# Patient Record
Sex: Female | Born: 1970 | Race: Black or African American | Hispanic: No | Smoking: Never smoker
Health system: Southern US, Community
[De-identification: ages and names within clinical notes are randomized; demographics above are authoritative.]

## PROBLEM LIST (undated history)

## (undated) DIAGNOSIS — I10 Essential (primary) hypertension: Secondary | ICD-10-CM

## (undated) DIAGNOSIS — M549 Dorsalgia, unspecified: Secondary | ICD-10-CM

## (undated) DIAGNOSIS — R809 Proteinuria, unspecified: Secondary | ICD-10-CM

## (undated) DIAGNOSIS — Z90710 Acquired absence of both cervix and uterus: Secondary | ICD-10-CM

## (undated) DIAGNOSIS — M329 Systemic lupus erythematosus, unspecified: Secondary | ICD-10-CM

## (undated) DIAGNOSIS — R42 Dizziness and giddiness: Secondary | ICD-10-CM

## (undated) DIAGNOSIS — R110 Nausea: Secondary | ICD-10-CM

## (undated) DIAGNOSIS — D649 Anemia, unspecified: Secondary | ICD-10-CM

## (undated) DIAGNOSIS — K5792 Diverticulitis of intestine, part unspecified, without perforation or abscess without bleeding: Secondary | ICD-10-CM

## (undated) HISTORY — DX: Anemia, unspecified: D64.9

## (undated) HISTORY — PX: TOE SURGERY: SHX1073

## (undated) HISTORY — PX: BREAST REDUCTION SURGERY: SHX8

## (undated) HISTORY — DX: Hypocalcemia: E83.51

## (undated) HISTORY — DX: Dizziness and giddiness: R42

## (undated) HISTORY — DX: Nausea: R11.0

## (undated) HISTORY — DX: Dorsalgia, unspecified: M54.9

## (undated) HISTORY — DX: Diverticulitis of intestine, part unspecified, without perforation or abscess without bleeding: K57.92

## (undated) HISTORY — PX: TUBAL LIGATION: SHX77

## (undated) HISTORY — DX: Acquired absence of both cervix and uterus: Z90.710

## (undated) HISTORY — PX: CHOLECYSTECTOMY: SHX55

## (undated) HISTORY — PX: ABDOMINAL HYSTERECTOMY: SHX81

---
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
## 1990-08-08 HISTORY — PX: TUBAL LIGATION: SHX77

## 1995-08-09 DIAGNOSIS — I10 Essential (primary) hypertension: Secondary | ICD-10-CM | POA: Insufficient documentation

## 1995-08-09 HISTORY — PX: CHOLECYSTECTOMY: SHX55

## 1995-08-09 HISTORY — DX: Essential (primary) hypertension: I10

## 1997-08-08 HISTORY — PX: VAGINAL HYSTERECTOMY: SUR661

## 2005-08-08 HISTORY — PX: BREAST REDUCTION SURGERY: SHX8

## 2005-08-08 HISTORY — PX: REDUCTION MAMMAPLASTY: SUR839

## 2010-08-08 DIAGNOSIS — IMO0002 Reserved for concepts with insufficient information to code with codable children: Secondary | ICD-10-CM

## 2010-08-08 DIAGNOSIS — M329 Systemic lupus erythematosus, unspecified: Secondary | ICD-10-CM

## 2010-08-08 HISTORY — DX: Systemic lupus erythematosus, unspecified: M32.9

## 2010-08-08 HISTORY — DX: Reserved for concepts with insufficient information to code with codable children: IMO0002

## 2017-12-01 ENCOUNTER — Emergency Department (HOSPITAL_COMMUNITY): Admission: EM | Admit: 2017-12-01 | Discharge: 2017-12-01 | Discharge status: Against Medical Advice | Payer: Self-pay

## 2018-04-18 ENCOUNTER — Encounter (HOSPITAL_COMMUNITY): Payer: Self-pay | Admitting: Family Medicine

## 2018-04-18 ENCOUNTER — Emergency Department (HOSPITAL_COMMUNITY)
Admission: EM | Admit: 2018-04-18 | Discharge: 2018-04-18 | Disposition: A | Discharge status: Home / Self Care | Payer: BLUE CROSS/BLUE SHIELD | Attending: Emergency Medicine | Admitting: Emergency Medicine

## 2018-04-18 DIAGNOSIS — K0889 Other specified disorders of teeth and supporting structures: Secondary | ICD-10-CM | POA: Insufficient documentation

## 2018-04-18 DIAGNOSIS — I10 Essential (primary) hypertension: Secondary | ICD-10-CM | POA: Insufficient documentation

## 2018-04-18 HISTORY — DX: Essential (primary) hypertension: I10

## 2018-04-18 HISTORY — DX: Systemic lupus erythematosus, unspecified: M32.9

## 2018-04-18 MED ORDER — HYDROCODONE-ACETAMINOPHEN 5-325 MG PO TABS
1.0000 | ORAL_TABLET | Freq: Once | ORAL | Status: AC
  Administered 2018-04-18: 1 via ORAL
  Filled 2018-04-18: qty 1

## 2018-04-18 MED ORDER — HYDROCODONE-ACETAMINOPHEN 5-325 MG PO TABS: 1.0000 | ORAL_TABLET | ORAL | 0 refills | Status: AC | PRN

## 2018-04-18 NOTE — ED Triage Notes (Signed)
Patient reports she had a dental filling from the left upper come out. She has a dental appointment tomorrow 2:15 but the pain is intense.

## 2018-04-18 NOTE — ED Provider Notes (Signed)
[facility]-EMERGENCY DEPT Provider Note   CSN: [id number] Arrival date & time: 04/18/18  1801     History   Chief Complaint Chief Complaint  Patient presents with  . Dental Pain    HPI Crystal Winters is a 47 y.o. female.  47 y.o female with a PMH of HTN presents to the ED with a chief complaint of dental pain. Patient reports a filling fell off her left tooth yesterday, she reports sharp pain to her tooth. She was seen by a dentist yesterday and referred to [facility] for further treatment. Patient has tried ibuprofen, tylenol, aleve and warm compresses to the area but states no relieve in symptoms. Patient appointment with [facility] tomorrow at 2:15pm but states the pain is too severe.  She denies any fever, difficulty swallowing, other symptoms.     Past Medical History:  Diagnosis Date  . Hypertension   . Lupus (HCC)     There are no active problems to display for this patient.   Past Surgical History:  Procedure Laterality Date  . ABDOMINAL HYSTERECTOMY    . BREAST REDUCTION SURGERY    . CHOLECYSTECTOMY    . TOE SURGERY Right   . TUBAL LIGATION       OB History   None      Home Medications    Prior to Admission medications   Medication Sig Start Date End Date Taking? Authorizing Provider  HYDROcodone-acetaminophen (NORCO/VICODIN) 5-325 MG tablet Take 1 tablet by mouth every 4 (four) hours as needed for up to 3 days. 04/18/18 04/21/18  Claude Manges, PA-C    Family History History reviewed. No pertinent family history.  Social History Social History   Tobacco Use  . Smoking status: Never Smoker  . Smokeless tobacco: Never Used  Substance Use Topics  . Alcohol use: Not Currently  . Drug use: Never     Allergies   Patient has no allergy information on record.   Review of Systems Review of Systems  Constitutional: Negative for fever.  HENT: Positive for dental problem. Negative for facial swelling.   All other systems reviewed and  are negative.    Physical Exam Updated Vital Signs BP (!) 166/119   Pulse 79   Temp 97.6 F (36.4 C)   Resp 19   Ht 6' (1.829 m)   Wt 86.2 kg   SpO2 95%   BMI 25.77 kg/m   Physical Exam  Constitutional: She is oriented to person, place, and time. She appears well-developed and well-nourished.  Nontoxic, non-ill-appearing.  Patient holding warm rag to her mouth.  HENT:  Head: Normocephalic and atraumatic.  Mouth/Throat: Uvula is midline and oropharynx is clear and moist. Abnormal dentition. Dental caries present. No dental abscesses. No posterior oropharyngeal edema or posterior oropharyngeal erythema.    There is a tooth broken off, no bone is exposed at this time.  There is no abscess or fluctuance formation around the area.  Eyes: Pupils are equal, round, and reactive to light.  Neck: Normal range of motion. Neck supple.  Cardiovascular: Normal heart sounds.  Pulmonary/Chest: Breath sounds normal.  Abdominal: Soft.  Musculoskeletal: She exhibits no tenderness or deformity.  Neurological: She is alert and oriented to person, place, and time.  Skin: Skin is warm and dry.  Nursing note and vitals reviewed.    ED Treatments / Results  Labs (all labs ordered are listed, but only abnormal results are displayed) Labs Reviewed - No data to display  EKG None  Radiology  No results found.  Procedures Procedures (including critical care time)  Medications Ordered in ED Medications  HYDROcodone-acetaminophen (NORCO/VICODIN) 5-325 MG per tablet 1 tablet (has no administration in time range)     Initial Impression / Assessment and Plan / ED Course  I have reviewed the triage vital signs and the nursing notes.  Pertinent labs & imaging results that were available during my care of the patient were reviewed by me and considered in my medical decision making (see chart for details).     Patient presents with dental pain that began a couple days ago.  She has tried  ibuprofen, Aleve, Tylenol states no relieving symptoms.  Patient has an appointment with [facility] tomorrow but in order to get her dental complaint evaluated.  There is no abscess present during examination.  At this time I will provide patient with the Vicodin during the ED and also give her a prescription for some tablets until her appointment tomorrow.  It appears nontoxic, she denies any fever or difficulty swallowing or other complaints.  Patient is hypertensive upon arrival she has a previous history of hypertension and states she took her medication but the pain is very severe however she denies any headache, chest pain, shortness of breath, difficulty urinating.  Patient understands and agrees with plan.  Return precautions provided.  Final Clinical Impressions(s) / ED Diagnoses   Final diagnoses:  Pain, dental    ED Discharge Orders         Ordered    HYDROcodone-acetaminophen (NORCO/VICODIN) 5-325 MG tablet  Every 4 hours PRN     04/18/18 1848           Claude Manges, PA-C 04/18/18 1850    Mancel Bale, MD 04/20/18 0011

## 2018-04-18 NOTE — Discharge Instructions (Addendum)
I have provided pain medication, please take as directed for severe pain.If you experience any fever, difficulty swallowing or worsening symptoms you may return to the ED for reevaluation.

## 2018-08-17 ENCOUNTER — Other Ambulatory Visit: Payer: Self-pay

## 2018-08-17 ENCOUNTER — Emergency Department (HOSPITAL_COMMUNITY)
Admission: EM | Admit: 2018-08-17 | Discharge: 2018-08-17 | Disposition: A | Discharge status: Home / Self Care | Payer: BLUE CROSS/BLUE SHIELD | Attending: Emergency Medicine | Admitting: Emergency Medicine

## 2018-08-17 ENCOUNTER — Encounter (HOSPITAL_COMMUNITY): Payer: Self-pay | Admitting: *Deleted

## 2018-08-17 ENCOUNTER — Emergency Department (HOSPITAL_COMMUNITY): Payer: BLUE CROSS/BLUE SHIELD

## 2018-08-17 DIAGNOSIS — R51 Headache: Secondary | ICD-10-CM

## 2018-08-17 DIAGNOSIS — R519 Headache, unspecified: Secondary | ICD-10-CM

## 2018-08-17 DIAGNOSIS — R079 Chest pain, unspecified: Secondary | ICD-10-CM | POA: Insufficient documentation

## 2018-08-17 DIAGNOSIS — I10 Essential (primary) hypertension: Secondary | ICD-10-CM | POA: Insufficient documentation

## 2018-08-17 LAB — CBC
HCT: 42.3 % (ref 36.0–46.0)
HEMOGLOBIN: 13.8 g/dL (ref 12.0–15.0)
MCH: 28.1 pg (ref 26.0–34.0)
MCHC: 32.6 g/dL (ref 30.0–36.0)
MCV: 86.2 fL (ref 80.0–100.0)
Platelets: 202 10*3/uL (ref 150–400)
RBC: 4.91 MIL/uL (ref 3.87–5.11)
RDW: 11.8 % (ref 11.5–15.5)
WBC: 9.3 10*3/uL (ref 4.0–10.5)
nRBC: 0 % (ref 0.0–0.2)

## 2018-08-17 LAB — BASIC METABOLIC PANEL
Anion gap: 11 (ref 5–15)
BUN: 13 mg/dL (ref 6–20)
CALCIUM: 8.9 mg/dL (ref 8.9–10.3)
CO2: 23 mmol/L (ref 22–32)
Chloride: 100 mmol/L (ref 98–111)
Creatinine, Ser: 0.82 mg/dL (ref 0.44–1.00)
GFR calc Af Amer: >60 mL/min (ref >60)
GLUCOSE: 118 mg/dL — AB (ref 70–99)
POTASSIUM: 3.2 mmol/L — AB (ref 3.5–5.1)
Sodium: 134 mmol/L — ABNORMAL LOW (ref 135–145)

## 2018-08-17 LAB — POCT I-STAT TROPONIN I: TROPONIN I, POC: 0 ng/mL (ref 0.00–0.08)

## 2018-08-17 MED ORDER — ALUM & MAG HYDROXIDE-SIMETH 200-200-20 MG/5ML PO SUSP
30.0000 mL | Freq: Once | ORAL | Status: AC
  Administered 2018-08-17: 30 mL via ORAL
  Filled 2018-08-17: qty 30

## 2018-08-17 MED ORDER — HYDROCODONE-ACETAMINOPHEN 5-325 MG PO TABS
1.0000 | ORAL_TABLET | Freq: Once | ORAL | Status: AC
  Administered 2018-08-17: 1 via ORAL
  Filled 2018-08-17: qty 1

## 2018-08-17 MED ORDER — LIDOCAINE VISCOUS HCL 2 % MT SOLN
15.0000 mL | Freq: Once | OROMUCOSAL | Status: AC
  Administered 2018-08-17: 15 mL via ORAL
  Filled 2018-08-17: qty 15

## 2018-08-17 MED ORDER — ONDANSETRON 8 MG PO TBDP
8.0000 mg | ORAL_TABLET | Freq: Once | ORAL | Status: AC
  Administered 2018-08-17: 8 mg via ORAL
  Filled 2018-08-17: qty 1

## 2018-08-17 NOTE — ED Triage Notes (Signed)
Pt c/o mid-sternal c/p.  Have been seen by a cardiologist in [place].  Pt c/o HTN, headache, & vomiting.

## 2018-08-17 NOTE — ED Provider Notes (Signed)
[facility]-EMERGENCY DEPT Provider Note   CSN: [id number] Arrival date & time: 08/17/18  0037     History   Chief Complaint Chief Complaint  Patient presents with  . Chest Pain    HPI Crystal Winters is a 48 y.o. female.  Patient to Ed with c/o chest pain located in the center of her chest. It is described as steady pressure, radiating to center of back and constant. She denies cough or fever. She denies SOB. She has had mild nausea without vomiting. She has seen a cardiologist in the past for similar symptoms but denies diagnosis of heart disease.  She did not take anything for symptoms. No modifying factors identified.  The history is provided by the patient. No language interpreter was used.    Past Medical History:  Diagnosis Date  . Hypertension   . Lupus (HCC)     There are no active problems to display for this patient.   Past Surgical History:  Procedure Laterality Date  . ABDOMINAL HYSTERECTOMY    . BREAST REDUCTION SURGERY    . CHOLECYSTECTOMY    . TOE SURGERY Right   . TUBAL LIGATION       OB History   No obstetric history on file.      Home Medications    Prior to Admission medications   Not on File    Family History No family history on file.  Social History Social History   Tobacco Use  . Smoking status: Never Smoker  . Smokeless tobacco: Never Used  Substance Use Topics  . Alcohol use: Not Currently  . Drug use: Never     Allergies   Prochlorperazine   Review of Systems Review of Systems  Constitutional: Negative for chills, diaphoresis and fever.  HENT: Negative.   Respiratory: Negative.  Negative for cough and shortness of breath.   Cardiovascular: Positive for chest pain. Negative for palpitations.  Gastrointestinal: Positive for nausea. Negative for vomiting.  Musculoskeletal: Negative.   Skin: Negative.   Neurological: Negative.      Physical Exam Updated Vital Signs BP (!) 154/100 (BP  Location: Right Arm)   Pulse 76   Temp 98.1 F (36.7 C) (Oral)   Resp 14   Ht 6' (1.829 m)   Wt 83.9 kg   SpO2 98%   BMI 25.09 kg/m   Physical Exam Constitutional:      Appearance: She is well-developed.  HENT:     Head: Normocephalic.  Neck:     Musculoskeletal: Normal range of motion and neck supple.  Cardiovascular:     Rate and Rhythm: Normal rate and regular rhythm.     Heart sounds: No murmur.  Pulmonary:     Effort: Pulmonary effort is normal.     Breath sounds: Normal breath sounds. No wheezing, rhonchi or rales.  Chest:     Chest wall: No tenderness.  Abdominal:     General: Bowel sounds are normal.     Palpations: Abdomen is soft.     Tenderness: There is no abdominal tenderness. There is no guarding or rebound.  Musculoskeletal: Normal range of motion.     Right lower leg: No edema.     Left lower leg: No edema.  Skin:    General: Skin is warm and dry.     Findings: No rash.  Neurological:     Mental Status: She is alert and oriented to person, place, and time.      ED Treatments / Results  Labs (all labs ordered are listed, but only abnormal results are displayed) Labs Reviewed  BASIC METABOLIC PANEL - Abnormal; Notable for the following components:      Result Value   Sodium 134 (*)    Potassium 3.2 (*)    Glucose, Bld 118 (*)    All other components within normal limits  CBC  I-STAT TROPONIN, ED  POCT I-STAT TROPONIN I    EKG EKG Interpretation  Date/Time:  Friday August 17 2018 01:02:31 EST Ventricular Rate:  68 PR Interval:    QRS Duration: 93 QT Interval:  405 QTC Calculation: 431 R Axis:   15 Text Interpretation:  Sinus rhythm Probable left atrial enlargement Minimal ST elevation, anterior leads No previous ECGs available Confirmed by Paula Libra ([id number]) on 08/17/2018 1:25:10 AM   Radiology Dg Chest 2 View  Result Date: 08/17/2018 CLINICAL DATA:  Chest pain EXAM: CHEST - 2 VIEW COMPARISON:  None. FINDINGS: The heart size and  mediastinal contours are within normal limits. Both lungs are clear. The visualized skeletal structures are unremarkable. IMPRESSION: No active cardiopulmonary disease. Electronically Signed   By: Jasmine Pang M.D.   On: 08/17/2018 01:50    Procedures Procedures (including critical care time)  Medications Ordered in ED Medications  alum & mag hydroxide-simeth (MAALOX/MYLANTA) 200-200-20 MG/5ML suspension 30 mL (has no administration in time range)    And  lidocaine (XYLOCAINE) 2 % viscous mouth solution 15 mL (has no administration in time range)  HYDROcodone-acetaminophen (NORCO/VICODIN) 5-325 MG per tablet 1 tablet (has no administration in time range)  ondansetron (ZOFRAN-ODT) disintegrating tablet 8 mg (8 mg Oral Given 08/17/18 0513)     Initial Impression / Assessment and Plan / ED Course  I have reviewed the triage vital signs and the nursing notes.  Pertinent labs & imaging results that were available during my care of the patient were reviewed by me and considered in my medical decision making (see chart for details).     Patient to ED with complaint of CP, without SOB or diaphoresis. Pain radiates to back.   The patient is reassured by her lab studies which show a negative troponin, EKG, CXR, CMET, CBC. Given no history of heart disease, distribution of pain and negative work up, GI cocktail ordered.   She reports some improvement with GI cocktail. She does not want to wait for another troponin to be done. She states she is tired and wants to go home. Feel she is safe for discharge. Encouraged to see her doctor for any ongoing symptoms.   Final Clinical Impressions(s) / ED Diagnoses   Final diagnoses:  None   1. Nonspecific chest pain  ED Discharge Orders    None       Elpidio Anis, PA-C 08/20/18 0429    Paula Libra, MD 08/20/18 2247

## 2018-08-17 NOTE — Discharge Instructions (Addendum)
Follow up with your doctor for recheck in the next week. Continue taking your regular medications. Return here with any worsening symptoms or new concerns.

## 2018-09-24 ENCOUNTER — Encounter (HOSPITAL_COMMUNITY): Payer: Self-pay

## 2018-09-24 ENCOUNTER — Inpatient Hospital Stay (HOSPITAL_COMMUNITY)
Admission: EM | Admit: 2018-09-24 | Discharge: 2018-09-28 | DRG: 378 | Disposition: A | Discharge status: Home / Self Care | Payer: Self-pay | Attending: Student | Admitting: Student

## 2018-09-24 ENCOUNTER — Emergency Department (HOSPITAL_COMMUNITY): Payer: Self-pay

## 2018-09-24 DIAGNOSIS — M2550 Pain in unspecified joint: Secondary | ICD-10-CM | POA: Diagnosis present

## 2018-09-24 DIAGNOSIS — M329 Systemic lupus erythematosus, unspecified: Secondary | ICD-10-CM | POA: Diagnosis present

## 2018-09-24 DIAGNOSIS — Z6826 Body mass index (BMI) 26.0-26.9, adult: Secondary | ICD-10-CM

## 2018-09-24 DIAGNOSIS — I493 Ventricular premature depolarization: Secondary | ICD-10-CM | POA: Diagnosis present

## 2018-09-24 DIAGNOSIS — Z9049 Acquired absence of other specified parts of digestive tract: Secondary | ICD-10-CM

## 2018-09-24 DIAGNOSIS — K5731 Diverticulosis of large intestine without perforation or abscess with bleeding: Principal | ICD-10-CM | POA: Diagnosis present

## 2018-09-24 DIAGNOSIS — D62 Acute posthemorrhagic anemia: Secondary | ICD-10-CM | POA: Diagnosis present

## 2018-09-24 DIAGNOSIS — E663 Overweight: Secondary | ICD-10-CM | POA: Diagnosis present

## 2018-09-24 DIAGNOSIS — R55 Syncope and collapse: Secondary | ICD-10-CM | POA: Diagnosis present

## 2018-09-24 DIAGNOSIS — I10 Essential (primary) hypertension: Secondary | ICD-10-CM | POA: Diagnosis present

## 2018-09-24 DIAGNOSIS — Z79899 Other long term (current) drug therapy: Secondary | ICD-10-CM

## 2018-09-24 DIAGNOSIS — Z8249 Family history of ischemic heart disease and other diseases of the circulatory system: Secondary | ICD-10-CM

## 2018-09-24 DIAGNOSIS — Z888 Allergy status to other drugs, medicaments and biological substances status: Secondary | ICD-10-CM

## 2018-09-24 DIAGNOSIS — N179 Acute kidney failure, unspecified: Secondary | ICD-10-CM | POA: Diagnosis present

## 2018-09-24 DIAGNOSIS — K922 Gastrointestinal hemorrhage, unspecified: Secondary | ICD-10-CM

## 2018-09-24 DIAGNOSIS — E876 Hypokalemia: Secondary | ICD-10-CM | POA: Diagnosis present

## 2018-09-24 DIAGNOSIS — Z90710 Acquired absence of both cervix and uterus: Secondary | ICD-10-CM

## 2018-09-24 DIAGNOSIS — R42 Dizziness and giddiness: Secondary | ICD-10-CM | POA: Diagnosis present

## 2018-09-24 HISTORY — DX: Diverticulosis of large intestine without perforation or abscess with bleeding: K57.31

## 2018-09-24 HISTORY — DX: Systemic lupus erythematosus, unspecified: M32.9

## 2018-09-24 LAB — COMPREHENSIVE METABOLIC PANEL
ALBUMIN: 4.1 g/dL (ref 3.5–5.0)
ALK PHOS: 85 U/L (ref 38–126)
ALT: 16 U/L (ref 0–44)
AST: 20 U/L (ref 15–41)
Anion gap: 6 (ref 5–15)
BILIRUBIN TOTAL: 0.7 mg/dL (ref 0.3–1.2)
BUN: 17 mg/dL (ref 6–20)
CALCIUM: 8.4 mg/dL — AB (ref 8.9–10.3)
CO2: 26 mmol/L (ref 22–32)
CREATININE: 1.02 mg/dL — AB (ref 0.44–1.00)
Chloride: 108 mmol/L (ref 98–111)
GFR calc Af Amer: >60 mL/min (ref >60)
GLUCOSE: 106 mg/dL — AB (ref 70–99)
POTASSIUM: 3.5 mmol/L (ref 3.5–5.1)
Sodium: 140 mmol/L (ref 135–145)
TOTAL PROTEIN: 7.3 g/dL (ref 6.5–8.1)

## 2018-09-24 LAB — POC OCCULT BLOOD, ED: Fecal Occult Bld: NEGATIVE

## 2018-09-24 LAB — URINALYSIS, ROUTINE W REFLEX MICROSCOPIC
Glucose, UA: NEGATIVE mg/dL
Hgb urine dipstick: NEGATIVE
KETONES UR: 5 mg/dL — AB
Leukocytes,Ua: NEGATIVE
Nitrite: NEGATIVE
PH: 5 (ref 5.0–8.0)
Protein, ur: 30 mg/dL — AB
Specific Gravity, Urine: 1.032 — ABNORMAL HIGH (ref 1.005–1.030)

## 2018-09-24 LAB — PROTIME-INR
INR: 1.06
Prothrombin Time: 13.7 seconds (ref 11.4–15.2)

## 2018-09-24 LAB — CBC
HCT: 38.8 % (ref 36.0–46.0)
HCT: 32.6 % — ABNORMAL LOW (ref 36.0–46.0)
HEMOGLOBIN: 12.4 g/dL (ref 12.0–15.0)
Hemoglobin: 10.3 g/dL — ABNORMAL LOW (ref 12.0–15.0)
MCH: 28.1 pg (ref 26.0–34.0)
MCH: 28.1 pg (ref 26.0–34.0)
MCHC: 32 g/dL (ref 30.0–36.0)
MCHC: 31.6 g/dL (ref 30.0–36.0)
MCV: 87.8 fL (ref 80.0–100.0)
MCV: 88.8 fL (ref 80.0–100.0)
Platelets: 251 10*3/uL (ref 150–400)
Platelets: 200 10*3/uL (ref 150–400)
RBC: 4.42 MIL/uL (ref 3.87–5.11)
RBC: 3.67 MIL/uL — AB (ref 3.87–5.11)
RDW: 11.8 % (ref 11.5–15.5)
RDW: 12 % (ref 11.5–15.5)
WBC: 8.2 10*3/uL (ref 4.0–10.5)
WBC: 9.7 10*3/uL (ref 4.0–10.5)
nRBC: 0 % (ref 0.0–0.2)
nRBC: 0 % (ref 0.0–0.2)

## 2018-09-24 LAB — TROPONIN I

## 2018-09-24 LAB — TYPE AND SCREEN
ABO/RH(D): O POS
Antibody Screen: NEGATIVE

## 2018-09-24 LAB — I-STAT BETA HCG BLOOD, ED (MC, WL, AP ONLY): I-stat hCG, quantitative: <5 m[IU]/mL (ref <5)

## 2018-09-24 LAB — MAGNESIUM: Magnesium: 2.1 mg/dL (ref 1.7–2.4)

## 2018-09-24 LAB — LIPASE, BLOOD: Lipase: 29 U/L (ref 11–51)

## 2018-09-24 LAB — CBG MONITORING, ED: GLUCOSE-CAPILLARY: 122 mg/dL — AB (ref 70–99)

## 2018-09-24 MED ORDER — MELATONIN 3 MG PO TABS
3.0000 mg | ORAL_TABLET | Freq: Every evening | ORAL | Status: DC | PRN
  Administered 2018-09-25 – 2018-09-27 (×2): 3 mg via ORAL
  Filled 2018-09-24 (×4): qty 1

## 2018-09-24 MED ORDER — POTASSIUM CHLORIDE CRYS ER 20 MEQ PO TBCR
20.0000 meq | EXTENDED_RELEASE_TABLET | Freq: Once | ORAL | Status: AC
  Administered 2018-09-24: 20 meq via ORAL
  Filled 2018-09-24: qty 1

## 2018-09-24 MED ORDER — ONDANSETRON HCL 4 MG/2ML IJ SOLN
4.0000 mg | Freq: Four times a day (QID) | INTRAMUSCULAR | Status: DC | PRN
  Administered 2018-09-25 – 2018-09-27 (×3): 4 mg via INTRAVENOUS
  Filled 2018-09-24 (×3): qty 2

## 2018-09-24 MED ORDER — SODIUM CHLORIDE 0.9 % IV BOLUS
1000.0000 mL | Freq: Once | INTRAVENOUS | Status: AC
  Administered 2018-09-24: 1000 mL via INTRAVENOUS

## 2018-09-24 MED ORDER — SODIUM CHLORIDE 0.9% FLUSH: 3.0000 mL | Freq: Once | INTRAVENOUS | Status: DC

## 2018-09-24 MED ORDER — HYDROMORPHONE HCL 1 MG/ML IJ SOLN
1.0000 mg | Freq: Once | INTRAMUSCULAR | Status: AC
  Administered 2018-09-24: 1 mg via INTRAVENOUS
  Filled 2018-09-24: qty 1

## 2018-09-24 MED ORDER — ACETAMINOPHEN 325 MG PO TABS
650.0000 mg | ORAL_TABLET | Freq: Four times a day (QID) | ORAL | Status: DC | PRN
  Administered 2018-09-24 – 2018-09-28 (×3): 650 mg via ORAL
  Filled 2018-09-24 (×3): qty 2

## 2018-09-24 MED ORDER — IOPAMIDOL (ISOVUE-300) INJECTION 61%
INTRAVENOUS | Status: AC
  Filled 2018-09-24: qty 100

## 2018-09-24 MED ORDER — SODIUM CHLORIDE 0.9 % IV SOLN
INTRAVENOUS | Status: AC
  Administered 2018-09-24: 22:00:00 via INTRAVENOUS

## 2018-09-24 MED ORDER — ONDANSETRON HCL 4 MG PO TABS
4.0000 mg | ORAL_TABLET | Freq: Four times a day (QID) | ORAL | Status: DC | PRN
  Administered 2018-09-28: 4 mg via ORAL
  Filled 2018-09-24: qty 1

## 2018-09-24 MED ORDER — ACETAMINOPHEN 650 MG RE SUPP: 650.0000 mg | Freq: Four times a day (QID) | RECTAL | Status: DC | PRN

## 2018-09-24 MED ORDER — SODIUM CHLORIDE (PF) 0.9 % IJ SOLN
INTRAMUSCULAR | Status: AC
  Filled 2018-09-24: qty 50

## 2018-09-24 MED ORDER — ONDANSETRON HCL 4 MG/2ML IJ SOLN
4.0000 mg | Freq: Once | INTRAMUSCULAR | Status: AC
  Administered 2018-09-24: 4 mg via INTRAVENOUS
  Filled 2018-09-24: qty 2

## 2018-09-24 MED ORDER — IOPAMIDOL (ISOVUE-300) INJECTION 61%
100.0000 mL | Freq: Once | INTRAVENOUS | Status: AC | PRN
  Administered 2018-09-24: 100 mL via INTRAVENOUS

## 2018-09-24 NOTE — ED Notes (Signed)
Gave report to [place], Charity fundraiser for room 1226. Patient is currently using the bedside commode. When she is done, will transport patient upstairs.

## 2018-09-24 NOTE — H&P (Signed)
Crystal Winters ACZ:[id number] DOB: 04/20/1971 DOA: 09/24/2018     PCP: System, Pcp Not In   Outpatient Specialists:  NONE  used to see rheumatologist but not now   Patient arrived to ER on 09/24/18 at 1124  Patient coming from: home Lives   With family    Chief Complaint:  Chief Complaint  Patient presents with  . Rectal Bleeding  . Abdominal Pain    HPI: Crystal Winters is a 48 y.o. female with medical history significant of HTN, lupus, vertigo    Presented with   Abdominal pain and rectal bleeding repeated since this AM. NO prior hx of this, have had up to 7 bloody bowel movements today While in the emergency department she had repeated bloody bowel movements with clots. No fevers or chills no associated chest pain no cough or shortness of breath While in emergency waiting room patient had an episode of syncope  Denies NSAID use Yesterday she took dramamine for chronic vertigo   Regarding pertinent Chronic problems:    hx of HTN on Norvasc and Cozaar  Hx of Lupus on Plaquenil does not remember dose have not had steroids for 8 month   While in ER: Initial hemoglobin 12 Repeat hemoglobin obtained and GI was consulted Patient not tachycardic or hypotensive in ER Rectal exam showed red stools which was Hemoccult NEgative but grossly bloody with clots.   The following Work up has been ordered so far:  Orders Placed This Encounter  Procedures  . CT ABDOMEN PELVIS W CONTRAST  . Lipase, blood  . Comprehensive metabolic panel  . CBC  . Urinalysis, Routine w reflex microscopic  . Diet NPO time specified  . Saline Lock IV, Maintain IV access  . Orthostatic vital signs  . Consult to hospitalist  . I-Stat beta hCG blood, ED  . CBG monitoring, ED  . POC occult blood, ED Provider will collect  . EKG 12-Lead  . Type and screen [facility]  . ABO/Rh    Following Medications were ordered in ER: Medications  sodium chloride flush (NS) 0.9 %  injection 3 mL ( Intravenous Canceled Entry 09/24/18 1410)  iopamidol (ISOVUE-300) 61 % injection (has no administration in time range)  sodium chloride (PF) 0.9 % injection (has no administration in time range)  sodium chloride 0.9 % bolus 1,000 mL (0 mLs Intravenous Stopped 09/24/18 1410)  ondansetron (ZOFRAN) injection 4 mg (4 mg Intravenous Given 09/24/18 1253)  sodium chloride 0.9 % bolus 1,000 mL (0 mLs Intravenous Stopped 09/24/18 1443)  HYDROmorphone (DILAUDID) injection 1 mg (1 mg Intravenous Given 09/24/18 1420)  ondansetron (ZOFRAN) injection 4 mg (4 mg Intravenous Given 09/24/18 1420)  iopamidol (ISOVUE-300) 61 % injection 100 mL (100 mLs Intravenous Contrast Given 09/24/18 1550)    Significant initial  Findings: Abnormal Labs Reviewed  COMPREHENSIVE METABOLIC PANEL - Abnormal; Notable for the following components:      Result Value   Glucose, Bld 106 (*)    Creatinine, Ser 1.02 (*)    Calcium 8.4 (*)    All other components within normal limits  URINALYSIS, ROUTINE W REFLEX MICROSCOPIC - Abnormal; Notable for the following components:   Color, Urine AMBER (*)    APPearance HAZY (*)    Specific Gravity, Urine 1.032 (*)    Bilirubin Urine SMALL (*)    Ketones, ur 5 (*)    Protein, ur 30 (*)    Bacteria, UA FEW (*)    All other components within normal  limits  CBG MONITORING, ED - Abnormal; Notable for the following components:   Glucose-Capillary 122 (*)    All other components within normal limits     Lactic Acid, Venous No results found for: LATICACIDVEN  Na 140 K 3.5  Cr  Up from baseline see below Lab Results  Component Value Date   CREATININE 1.02 (H) 09/24/2018   CREATININE 0.82 08/17/2018      WBC 8.2   HG/HCT  Stable,     Component Value Date/Time   HGB 12.4 09/24/2018 1145   HCT 38.8 09/24/2018 1145     Troponin (Point of Care Test) No results for input(s): TROPIPOC in the last 72 hours.     Lipase 29   UA  no evidence of UTI        CTabd/pelvis - Descending and sigmoid diverticulosis   ECG:  Personally reviewed by me showing: HR : 73 Rhythm:  NSR,  no evidence of ischemic changes QTC 460     ED Triage Vitals  Enc Vitals Group     BP 09/24/18 1138 (!) 141/102     Pulse Rate 09/24/18 1138 (!) 120     Resp 09/24/18 1138 17     Temp 09/24/18 1138 98.7 F (37.1 C)     Temp Source 09/24/18 1138 Oral     SpO2 09/24/18 1138 97 %     Weight 09/24/18 1138 195 lb (88.5 kg)     Height 09/24/18 1138 6' (1.829 m)     Head Circumference --      Peak Flow --      Pain Score 09/24/18 1139 7     Pain Loc --      Pain Edu? --      Excl. in GC? --   TMAX(24)@       Latest  Blood pressure (!) 144/95, pulse 64, temperature 98.7 F (37.1 C), temperature source Oral, resp. rate 15, height 6' (1.829 m), weight 88.5 kg, SpO2 100 %.    [facility]   Dr.Buccini They Recommend admit to medicine   Will see in AM   Hospitalist was called for admission for gi bleed   Review of Systems:    Pertinent positives include:blood in stool, syncope  Constitutional:  No weight loss, night sweats, Fevers, chills, fatigue, weight loss  HEENT:  No headaches, Difficulty swallowing,Tooth/dental problems,Sore throat,  No sneezing, itching, ear ache, nasal congestion, post nasal drip,  Cardio-vascular:  No chest pain, Orthopnea, PND, anasarca, dizziness, palpitations.no Bilateral lower extremity swelling  GI:  No heartburn, indigestion, abdominal pain, nausea, vomiting, diarrhea, change in bowel habits, loss of appetite, melena, blood in stool, hematemesis Resp:  no shortness of breath at rest. No dyspnea on exertion, No excess mucus, no productive cough, No non-productive cough, No coughing up of blood.No change in color of mucus.No wheezing. Skin:  no rash or lesions. No jaundice GU:  no dysuria, change in color of urine, no urgency or frequency. No straining to urinate.  No flank pain.  Musculoskeletal:  No  joint pain or no joint swelling. No decreased range of motion. No back pain.  Psych:  No change in mood or affect. No depression or anxiety. No memory loss.  Neuro: no localizing neurological complaints, no tingling, no weakness, no double vision, no gait abnormality, no slurred speech, no confusion  All systems reviewed and apart from HOPI all are negative  Past Medical History:   Past Medical History:  Diagnosis  Date  . Hypertension   . Lupus [facility])       Past Surgical History:  Procedure Laterality Date  . ABDOMINAL HYSTERECTOMY    . BREAST REDUCTION SURGERY    . CHOLECYSTECTOMY    . TOE SURGERY Right   . TUBAL LIGATION      Social History:  Ambulatory   independently     reports that she has never smoked. She has never used smokeless tobacco. She reports previous alcohol use. She reports that she does not use drugs.     Family History:   Family History  Problem Relation Age of Onset  . Rectal cancer Other   . Hypertension Other   . Diabetes Other   . Breast cancer Other   . Colon cancer Other   . Prostate cancer Other   . Stroke Other   . CAD Other   . Coronary artery disease Other     Allergies: Allergies  Allergen Reactions  . Prochlorperazine Other (See Comments)    Muscle stiffness     Prior to Admission medications   Medication Sig Start Date End Date Taking? Authorizing Provider  amLODipine (NORVASC) 10 MG tablet Take 10 mg by mouth daily.   Yes [provider]  citalopram (CELEXA) 10 MG tablet Take 10 mg by mouth daily.   Yes [provider]  dimenhyDRINATE (DRAMAMINE) 50 MG tablet Take 50 mg by mouth daily as needed for nausea or dizziness.   Yes [provider]  losartan (COZAAR) 50 MG tablet Take 50 mg by mouth daily.   Yes [provider]   Physical Exam: Blood pressure (!) 144/95, pulse 64, temperature 98.7 F (37.1 C), temperature source Oral, resp. rate 15, height 6' (1.829 m), weight 88.5 kg, SpO2  100 %. 1. General:  in No  Acute distress   Chronically ill  -appearing 2. Psychological: Alert and   Oriented 3. Head/ENT:    Dry Mucous Membranes                          Head Non traumatic, neck supple                          Poor Dentition 4. SKIN: decreased Skin turgor,  Skin clean Dry and intact no rash 5. Heart: Regular rate and rhythm no Murmur, no Rub or gallop 6. Lungs:  no wheezes or crackles   7. Abdomen: Soft, mildly tender, Non distended  Obese bowel sounds present 8. Lower extremities: no clubbing, cyanosis, no  edema 9. Neurologically Grossly intact, moving all 4 extremities equally   10. MSK: Normal range of motion   LABS:     Recent Labs  Lab 09/24/18 1145  WBC 8.2  HGB 12.4  HCT 38.8  MCV 87.8  PLT 251   Basic Metabolic Panel: Recent Labs  Lab 09/24/18 1145  NA 140  K 3.5  CL 108  CO2 26  GLUCOSE 106*  BUN 17  CREATININE 1.02*  CALCIUM 8.4*      Recent Labs  Lab 09/24/18 1145  AST 20  ALT 16  ALKPHOS 85  BILITOT 0.7  PROT 7.3  ALBUMIN 4.1   Recent Labs  Lab 09/24/18 1145  LIPASE 29   No results for input(s): AMMONIA in the last 168 hours.    HbA1C: No results for input(s): HGBA1C in the last 72 hours. CBG: Recent Labs  Lab 09/24/18 1232  GLUCAP 122*      Urine analysis:    Component Value Date/Time   COLORURINE AMBER (A) 09/24/2018 1154   APPEARANCEUR HAZY (A) 09/24/2018 1154   LABSPEC 1.032 (H) 09/24/2018 1154   PHURINE 5.0 09/24/2018 1154   GLUCOSEU NEGATIVE 09/24/2018 1154   HGBUR NEGATIVE 09/24/2018 1154   BILIRUBINUR SMALL (A) 09/24/2018 1154   KETONESUR 5 (A) 09/24/2018 1154   PROTEINUR 30 (A) 09/24/2018 1154   NITRITE NEGATIVE 09/24/2018 1154   LEUKOCYTESUR NEGATIVE 09/24/2018 1154       Cultures: No results found for: SDES, SPECREQUEST, CULT, REPTSTATUS   Radiological Exams on Admission: Ct Abdomen Pelvis W Contrast  Result Date: 09/24/2018 CLINICAL DATA:  Abdominal distention, pain, rectal  bleeding EXAM: CT ABDOMEN AND PELVIS WITH CONTRAST TECHNIQUE: Multidetector CT imaging of the abdomen and pelvis was performed using the standard protocol following bolus administration of intravenous contrast. CONTRAST:  100mL ISOVUE-300 IOPAMIDOL (ISOVUE-300) INJECTION 61% COMPARISON:  None. FINDINGS: Lower chest: No acute abnormality. Hepatobiliary: No focal liver abnormality is seen. Status post cholecystectomy. No biliary dilatation. Pancreas: Unremarkable. No pancreatic ductal dilatation or surrounding inflammatory changes. Spleen: Normal in size without focal abnormality. Adrenals/Urinary Tract: Adrenal glands are unremarkable. Kidneys are normal, without renal calculi, focal lesion, or hydronephrosis. Bladder is unremarkable. Stomach/Bowel: Stomach is within normal limits. Appendix appears normal. No evidence of bowel wall thickening, distention, or inflammatory changes. Descending and sigmoid diverticulosis. Vascular/Lymphatic: No significant vascular findings are present. No enlarged abdominal or pelvic lymph nodes. Reproductive: No mass or other abnormality. Status post hysterectomy. Other: No abdominal wall hernia or abnormality. No abdominopelvic ascites. Musculoskeletal: No acute or significant osseous findings. IMPRESSION: 1. No definite CT findings to explain abdominal pain and rectal bleeding. 2. Descending and sigmoid diverticulosis without intraluminal contrast or other findings to specifically localize rectal bleeding on this non tailored examination for evaluation of GI bleeding. Electronically Signed   By: Lauralyn PrimesAlex  Bibbey M.D.   On: 09/24/2018 16:10    Chart has been reviewed    Assessment/Plan  48 y.o. female with medical history significant of HTN lupus   Admitted for lower GI bleed likely diverticular  Present on Admission: . Lower GI bleed - - Suspect Lower Gi source No hx of PUD,  melena,  BUN elevation to  suggest otherwise  - Admit  For further management given:  comorbid  illnesses LUPUS  ,  hemodynamic instability Syncope,  gross rectal bleeding, rebleeding     -  most likely Diverticular source   hemodynamic instability present with syncope      - Admit to stepdown given above    -  ER  Provider spoke to gastroenterology ( [facility]  they will see patient in a.m. appreciate their consult   - serial CBC.    - Monitor for any recurrence,  evidence of hemodynamic instability or significant blood loss -  type and screen,  - Transfuse as needed for hemoglobin below 7 or <9 if evidence of significant  bleeding  - Establish at least 2 PIV and fluid resuscitate   - clear liquids for tonight keep nothing by mouth post midnight,  -  monitor for Recurrent significant  Bleeding of red blood and hemodynamic instability in which case Bleeding scan and IR consult would be indicated.   . Essential hypertension on hold for tonight  . Syncope - in the setting of GI bleed, suspect vasovagal, given risk factors of autoimmune disorder we will cycle cardiac enzymes monitor on telemetry . Lupus (HCC) -  hold plaquenil for tonight Hypokalemia will replace   Other plan as per orders.  DVT prophylaxis:  SCD      Code Status:  FULL CODE as per patient   I had personally discussed CODE STATUS with patient and family   Family Communication:   Family  at  Bedside  plan of care was discussed with  Daughter, Wife, Husband, Sister, Brother , father, mother  Disposition Plan:     To home once workup is complete and patient is stable                                    Consults called:  [facility]  Admission status:   Obs    Level of care      SDU tele indefinitely please discontinue once patient no longer qualifies      Caddie Randle 09/24/2018, 7:54 PM    [facility]     after 2 AM please page floor coverage PA If 7AM-7PM, please contact the day team taking care of the patient using [email or web address]

## 2018-09-24 NOTE — ED Provider Notes (Signed)
7:12 PM Repeat cbc pending GI consultation Admit to hospitalist Diverticulosis without diverticulitis    Azalia Bilis, MD 09/24/18 [phone]

## 2018-09-24 NOTE — ED Notes (Signed)
ED TO INPATIENT HANDOFF REPORT  Name/Age/Gender Crystal Winters 48 y.o. female  Code Status   Home/SNF/Other Home  Chief Complaint rectal bleeding  Level of Care/Admitting Diagnosis ED Disposition    ED Disposition Condition Comment   Admit  Hospital Area: [facility] [[id number]]  Level of Care: Stepdown [14]  Admit to SDU based on following criteria: Hemodynamic compromise or significant risk of instability:  Patient requiring short term acute titration and management of vasoactive drips, and invasive monitoring (i.e., CVP and Arterial line).  Diagnosis: Lower GI bleed [426834]  Admitting Physician: Therisa Doyne [[id number]]  Attending Physician: Therisa Doyne [[id number]]  PT Class (Do Not Modify): Observation [104]  PT Acc Code (Do Not Modify): Observation [10022]       Medical History Past Medical History:  Diagnosis Date  . Hypertension   . Lupus (HCC)     Allergies Allergies  Allergen Reactions  . Prochlorperazine Other (See Comments)    Muscle stiffness    IV Location/Drains/Wounds Patient Lines/Drains/Airways Status   Active Line/Drains/Airways    Name:   Placement date:   Placement time:   Site:   Days:   Peripheral IV 09/24/18 Right Antecubital   09/24/18    1230    Antecubital   less than 1   Peripheral IV 09/24/18 Left Antecubital   09/24/18    1231    Antecubital   less than 1          Labs/Imaging Results for orders placed or performed during the hospital encounter of 09/24/18 (from the past 48 hour(s))  Lipase, blood     Status: None   Collection Time: 09/24/18 11:45 AM  Result Value Ref Range   Lipase 29 11 - 51 U/L    Comment: Performed at [facility], [address], [place], [place] [address]  Comprehensive metabolic panel     Status: Abnormal   Collection Time: 09/24/18 11:45 AM  Result Value Ref Range   Sodium 140 135 - 145 mmol/L   Potassium 3.5 3.5 - 5.1 mmol/L   Chloride 108 98 - 111 mmol/L    CO2 26 22 - 32 mmol/L   Glucose, Bld 106 (H) 70 - 99 mg/dL   BUN 17 6 - 20 mg/dL   Creatinine, Ser 2.97 (H) 0.44 - 1.00 mg/dL   Calcium 8.4 (L) 8.9 - 10.3 mg/dL   Total Protein 7.3 6.5 - 8.1 g/dL   Albumin 4.1 3.5 - 5.0 g/dL   AST 20 15 - 41 U/L   ALT 16 0 - 44 U/L   Alkaline Phosphatase 85 38 - 126 U/L   Total Bilirubin 0.7 0.3 - 1.2 mg/dL   GFR calc non Af Amer >60 >60 mL/min   GFR calc Af Amer >60 >60 mL/min   Anion gap 6 5 - 15    Comment: Performed at [facility], [address], [place], [place] [address]  CBC     Status: None   Collection Time: 09/24/18 11:45 AM  Result Value Ref Range   WBC 8.2 4.0 - 10.5 K/uL   RBC 4.42 3.87 - 5.11 MIL/uL   Hemoglobin 12.4 12.0 - 15.0 g/dL   HCT 19.4 17.4 - 08.1 %   MCV 87.8 80.0 - 100.0 fL   MCH 28.1 26.0 - 34.0 pg   MCHC 32.0 30.0 - 36.0 g/dL   RDW 44.8 18.5 - 63.1 %   Platelets 251 150 - 400 K/uL   nRBC 0.0 0.0 -  0.2 %    Comment: Performed at [facility], [address] [place], [place] [address]  I-Stat beta hCG blood, ED     Status: None   Collection Time: 09/24/18 11:53 AM  Result Value Ref Range   I-stat hCG, quantitative <5.0 <5 mIU/mL   Comment 3            Comment:   GEST. AGE      CONC.  (mIU/mL)   <=1 WEEK        5 - 50     2 WEEKS       50 - 500     3 WEEKS       100 - 10,000     4 WEEKS     1,000 - 30,000        FEMALE AND NON-PREGNANT FEMALE:     LESS THAN 5 mIU/mL   Urinalysis, Routine w reflex microscopic     Status: Abnormal   Collection Time: 09/24/18 11:54 AM  Result Value Ref Range   Color, Urine AMBER (A) YELLOW    Comment: BIOCHEMICALS MAY BE AFFECTED BY COLOR   APPearance HAZY (A) CLEAR   Specific Gravity, Urine 1.032 (H) 1.005 - 1.030   pH 5.0 5.0 - 8.0   Glucose, UA NEGATIVE NEGATIVE mg/dL   Hgb urine dipstick NEGATIVE NEGATIVE   Bilirubin Urine SMALL (A) NEGATIVE   Ketones, ur 5 (A) NEGATIVE mg/dL   Protein, ur 30 (A) NEGATIVE mg/dL   Nitrite  NEGATIVE NEGATIVE   Leukocytes,Ua NEGATIVE NEGATIVE   RBC / HPF 0-5 0 - 5 RBC/hpf   WBC, UA 6-10 0 - 5 WBC/hpf   Bacteria, UA FEW (A) NONE SEEN   Squamous Epithelial / LPF 11-20 0 - 5   Mucus PRESENT    Hyaline Casts, UA PRESENT     Comment: Performed at [facility], [address] [place], [place] [address]  CBG monitoring, ED     Status: Abnormal   Collection Time: 09/24/18 12:32 PM  Result Value Ref Range   Glucose-Capillary 122 (H) 70 - 99 mg/dL  Type and screen [facility]     Status: None   Collection Time: 09/24/18 12:33 PM  Result Value Ref Range   ABO/RH(D) O POS    Antibody Screen NEG    Sample Expiration      09/27/2018 Performed at [facility], [address] [place], [place] [address]   POC occult blood, ED Provider will collect     Status: None   Collection Time: 09/24/18  2:09 PM  Result Value Ref Range   Fecal Occult Bld NEGATIVE NEGATIVE   Ct Abdomen Pelvis W Contrast  Result Date: 09/24/2018 CLINICAL DATA:  Abdominal distention, pain, rectal bleeding EXAM: CT ABDOMEN AND PELVIS WITH CONTRAST TECHNIQUE: Multidetector CT imaging of the abdomen and pelvis was performed using the standard protocol following bolus administration of intravenous contrast. CONTRAST:  100mL ISOVUE-300 IOPAMIDOL (ISOVUE-300) INJECTION 61% COMPARISON:  None. FINDINGS: Lower chest: No acute abnormality. Hepatobiliary: No focal liver abnormality is seen. Status post cholecystectomy. No biliary dilatation. Pancreas: Unremarkable. No pancreatic ductal dilatation or surrounding inflammatory changes. Spleen: Normal in size without focal abnormality. Adrenals/Urinary Tract: Adrenal glands are unremarkable. Kidneys are normal, without renal calculi, focal lesion, or hydronephrosis. Bladder is unremarkable. Stomach/Bowel: Stomach is within normal limits. Appendix appears normal. No evidence of bowel wall thickening, distention, or inflammatory  changes. Descending and sigmoid diverticulosis. Vascular/Lymphatic: No  significant vascular findings are present. No enlarged abdominal or pelvic lymph nodes. Reproductive: No mass or other abnormality. Status post hysterectomy. Other: No abdominal wall hernia or abnormality. No abdominopelvic ascites. Musculoskeletal: No acute or significant osseous findings. IMPRESSION: 1. No definite CT findings to explain abdominal pain and rectal bleeding. 2. Descending and sigmoid diverticulosis without intraluminal contrast or other findings to specifically localize rectal bleeding on this non tailored examination for evaluation of GI bleeding. Electronically Signed   By: Lauralyn Primes M.D.   On: 09/24/2018 16:10   None  Pending Labs Unresulted Labs (From admission, onward)    Start     Ordered   09/25/18 1000  CBC  Now then every 6 hours,   R     09/24/18 1955   09/24/18 1946  Magnesium  Add-on,   R     09/24/18 1945   09/24/18 1917  Troponin I - Add-On to previous collection  Add-on,   R     09/24/18 1916   09/24/18 1917  Troponin I - Now Then Q6H  Now then every 6 hours,   R     09/24/18 1916   09/24/18 1917  Protime-INR  Once,   R     09/24/18 1916   09/24/18 1912  CBC  ONCE - STAT,   STAT     09/24/18 1911   09/24/18 1233  ABO/Rh  Once,   R     09/24/18 1233   Unscheduled  Occult blood card to lab, stool RN will collect  As needed,   R    Question:  Specimen to be collected by?  Answer:  RN will collect   09/24/18 1949   Signed and Held  HIV antibody (Routine Testing)  Tomorrow morning,   R     Signed and Held   Signed and Held  Magnesium  Tomorrow morning,   R    Comments:  Call MD if <1.5    Signed and Held   Signed and Held  Phosphorus  Tomorrow morning,   R     Signed and Held   Signed and Held  TSH  Once,   R    Comments:  Cancel if already done within 1 month and notify MD    Signed and Held   Signed and Held  Comprehensive metabolic panel  Once,   R    Comments:  Cal MD for  K<3.5 or >5.0    Signed and Held   Signed and Held  CBC  Once,   R    Comments:  Call for hg <8.0    Signed and Held          Vitals/Pain Today's Vitals   09/24/18 1540 09/24/18 1900 09/24/18 1930 09/24/18 2000  BP: (!) 142/97 (!) 144/95 (!) 144/94 (!) 144/103  Pulse: 66 64 66 69  Resp: (!) 26 15 18 17   Temp:      TempSrc:      SpO2: 99% 100% 100% 100%  Weight:      Height:      PainSc:        Isolation Precautions No active isolations  Medications Medications  sodium chloride flush (NS) 0.9 % injection 3 mL ( Intravenous Canceled Entry 09/24/18 1410)  iopamidol (ISOVUE-300) 61 % injection (has no administration in time range)  sodium chloride (PF) 0.9 % injection (has no administration in time range)  potassium chloride SA (K-DUR,KLOR-CON) CR tablet 20 mEq (has no administration in time  range)  sodium chloride 0.9 % bolus 1,000 mL (0 mLs Intravenous Stopped 09/24/18 1410)  ondansetron (ZOFRAN) injection 4 mg (4 mg Intravenous Given 09/24/18 1253)  sodium chloride 0.9 % bolus 1,000 mL (0 mLs Intravenous Stopped 09/24/18 1443)  HYDROmorphone (DILAUDID) injection 1 mg (1 mg Intravenous Given 09/24/18 1420)  ondansetron (ZOFRAN) injection 4 mg (4 mg Intravenous Given 09/24/18 1420)  iopamidol (ISOVUE-300) 61 % injection 100 mL (100 mLs Intravenous Contrast Given 09/24/18 1550)    Mobility walks

## 2018-09-24 NOTE — ED Notes (Signed)
Chaperoned Dr. Clelia Schaumann of rectal exam.

## 2018-09-24 NOTE — ED Notes (Signed)
Pt had another bloody BM.  Pt states this is her 8th one today.

## 2018-09-24 NOTE — ED Notes (Addendum)
Pt had grossly bloody BM, moderate amount. Pt's 7th bloody BM today.

## 2018-09-24 NOTE — ED Provider Notes (Signed)
[facility]-EMERGENCY DEPT Provider Note   CSN: [id number] Arrival date & time: 09/24/18  1124    History   Chief Complaint Chief Complaint  Patient presents with  . Rectal Bleeding  . Abdominal Pain    HPI Crystal Winters is a 48 y.o. female.     Patient complains of rectal bleeding abdominal cramping.  She has had numerous episodes of rectal bleeding  The history is provided by the patient. No language interpreter was used.  Rectal Bleeding  Quality:  Bright red Amount:  Moderate Timing:  Constant Chronicity:  New Context: not anal fissures   Similar prior episodes: no   Relieved by:  Nothing Worsened by:  Nothing Ineffective treatments:  None tried Associated symptoms: abdominal pain   Risk factors: no anticoagulant use   Abdominal Pain  Associated symptoms: hematochezia   Associated symptoms: no chest pain, no cough, no diarrhea, no fatigue and no hematuria     Past Medical History:  Diagnosis Date  . Hypertension   . Lupus (HCC)     There are no active problems to display for this patient.   Past Surgical History:  Procedure Laterality Date  . ABDOMINAL HYSTERECTOMY    . BREAST REDUCTION SURGERY    . CHOLECYSTECTOMY    . TOE SURGERY Right   . TUBAL LIGATION       OB History   No obstetric history on file.      Home Medications    Prior to Admission medications   Medication Sig Start Date End Date Taking? Authorizing Provider  amLODipine (NORVASC) 10 MG tablet Take 10 mg by mouth daily.   Yes [provider]  citalopram (CELEXA) 10 MG tablet Take 10 mg by mouth daily.   Yes [provider]  dimenhyDRINATE (DRAMAMINE) 50 MG tablet Take 50 mg by mouth daily as needed for nausea or dizziness.   Yes [provider]  losartan (COZAAR) 50 MG tablet Take 50 mg by mouth daily.   Yes [provider]    Family History History reviewed. No pertinent family history.  Social History Social  History   Tobacco Use  . Smoking status: Never Smoker  . Smokeless tobacco: Never Used  Substance Use Topics  . Alcohol use: Not Currently  . Drug use: Never     Allergies   Prochlorperazine   Review of Systems Review of Systems  Constitutional: Negative for appetite change and fatigue.  HENT: Negative for congestion, ear discharge and sinus pressure.   Eyes: Negative for discharge.  Respiratory: Negative for cough.   Cardiovascular: Negative for chest pain.  Gastrointestinal: Positive for abdominal pain and hematochezia. Negative for diarrhea.  Genitourinary: Negative for frequency and hematuria.  Musculoskeletal: Negative for back pain.  Skin: Negative for rash.  Neurological: Negative for seizures and headaches.  Psychiatric/Behavioral: Negative for hallucinations.     Physical Exam Updated Vital Signs BP (!) 133/91   Pulse 67   Temp 98.7 F (37.1 C) (Oral)   Resp 13   Ht 6' (1.829 m)   Wt 88.5 kg   SpO2 100%   BMI 26.45 kg/m   Physical Exam Vitals signs and nursing note reviewed.  Constitutional:      Appearance: She is well-developed.  HENT:     Head: Normocephalic.     Nose: Nose normal.  Eyes:     General: No scleral icterus.    Conjunctiva/sclera: Conjunctivae normal.  Neck:     Musculoskeletal: Neck supple.  Thyroid: No thyromegaly.  Cardiovascular:     Rate and Rhythm: Normal rate and regular rhythm.     Heart sounds: No murmur. No friction rub. No gallop.   Pulmonary:     Breath sounds: No stridor. No wheezing or rales.  Chest:     Chest wall: No tenderness.  Abdominal:     General: There is no distension.     Tenderness: There is no abdominal tenderness. There is no rebound.  Genitourinary:    Comments: Rectal exam shows red stool heme positive Musculoskeletal: Normal range of motion.  Lymphadenopathy:     Cervical: No cervical adenopathy.  Skin:    Findings: No erythema or rash.  Neurological:     Mental Status: She is alert  and oriented to person, place, and time.     Motor: No abnormal muscle tone.     Coordination: Coordination normal.  Psychiatric:        Behavior: Behavior normal.      ED Treatments / Results  Labs (all labs ordered are listed, but only abnormal results are displayed) Labs Reviewed  COMPREHENSIVE METABOLIC PANEL - Abnormal; Notable for the following components:      Result Value   Glucose, Bld 106 (*)    Creatinine, Ser 1.02 (*)    Calcium 8.4 (*)    All other components within normal limits  URINALYSIS, ROUTINE W REFLEX MICROSCOPIC - Abnormal; Notable for the following components:   Color, Urine AMBER (*)    APPearance HAZY (*)    Specific Gravity, Urine 1.032 (*)    Bilirubin Urine SMALL (*)    Ketones, ur 5 (*)    Protein, ur 30 (*)    Bacteria, UA FEW (*)    All other components within normal limits  CBG MONITORING, ED - Abnormal; Notable for the following components:   Glucose-Capillary 122 (*)    All other components within normal limits  LIPASE, BLOOD  CBC  I-STAT BETA HCG BLOOD, ED ([facility], [facility], [facility] ONLY)  POC OCCULT BLOOD, ED  TYPE AND SCREEN  ABO/RH    EKG None  Radiology No results found.  Procedures Procedures (including critical care time)  Medications Ordered in ED Medications  sodium chloride flush (NS) 0.9 % injection 3 mL ( Intravenous Canceled Entry 09/24/18 1410)  iopamidol (ISOVUE-300) 61 % injection (has no administration in time range)  sodium chloride (PF) 0.9 % injection (has no administration in time range)  sodium chloride 0.9 % bolus 1,000 mL (0 mLs Intravenous Stopped 09/24/18 1410)  ondansetron (ZOFRAN) injection 4 mg (4 mg Intravenous Given 09/24/18 1253)  sodium chloride 0.9 % bolus 1,000 mL (0 mLs Intravenous Stopped 09/24/18 1443)  HYDROmorphone (DILAUDID) injection 1 mg (1 mg Intravenous Given 09/24/18 1420)  ondansetron (ZOFRAN) injection 4 mg (4 mg Intravenous Given 09/24/18 1420)  iopamidol (ISOVUE-300) 61 % injection 100 mL (100 mLs  Intravenous Contrast Given 09/24/18 1550)     Initial Impression / Assessment and Plan / ED Course  I have reviewed the triage vital signs and the nursing notes.  Pertinent labs & imaging results that were available during my care of the patient were reviewed by me and considered in my medical decision making (see chart for details). CRITICAL CARE Performed by: Bethann Berkshire Total critical care time: 35 minutes Critical care time was exclusive of separately billable procedures and treating other patients. Critical care was necessary to treat or prevent imminent or life-threatening deterioration. Critical care was time spent personally by me  on the following activities: development of treatment plan with patient and/or surrogate as well as nursing, discussions with consultants, evaluation of patient's response to treatment, examination of patient, obtaining history from patient or surrogate, ordering and performing treatments and interventions, ordering and review of laboratory studies, ordering and review of radiographic studies, pulse oximetry and re-evaluation of patient's condition.        Patient with multiple episodes of rectal bleeding.  CT scan pending Patient is being admitted for lower GI bleed Final Clinical Impressions(s) / ED Diagnoses   Final diagnoses:  None    ED Discharge Orders    None       Bethann Berkshire, MD 09/25/18 1249

## 2018-09-24 NOTE — ED Triage Notes (Signed)
Pt presents with c/o abdominal pain and rectal bleeding. Pt reports she has had multiple episodes of rectal bleeding since this morning.

## 2018-09-25 ENCOUNTER — Other Ambulatory Visit: Payer: Self-pay

## 2018-09-25 LAB — CBC
HCT: 32.3 % — ABNORMAL LOW (ref 36.0–46.0)
HCT: 33.8 % — ABNORMAL LOW (ref 36.0–46.0)
Hemoglobin: 10.2 g/dL — ABNORMAL LOW (ref 12.0–15.0)
Hemoglobin: 10.6 g/dL — ABNORMAL LOW (ref 12.0–15.0)
MCH: 28.4 pg (ref 26.0–34.0)
MCH: 28 pg (ref 26.0–34.0)
MCHC: 31.6 g/dL (ref 30.0–36.0)
MCHC: 31.4 g/dL (ref 30.0–36.0)
MCV: 90 fL (ref 80.0–100.0)
MCV: 89.2 fL (ref 80.0–100.0)
Platelets: 188 10*3/uL (ref 150–400)
Platelets: 188 10*3/uL (ref 150–400)
RBC: 3.59 MIL/uL — ABNORMAL LOW (ref 3.87–5.11)
RBC: 3.79 MIL/uL — ABNORMAL LOW (ref 3.87–5.11)
RDW: 11.9 % (ref 11.5–15.5)
RDW: 11.9 % (ref 11.5–15.5)
WBC: 7.7 10*3/uL (ref 4.0–10.5)
WBC: 5.2 10*3/uL (ref 4.0–10.5)
nRBC: 0 % (ref 0.0–0.2)
nRBC: 0 % (ref 0.0–0.2)

## 2018-09-25 LAB — COMPREHENSIVE METABOLIC PANEL
ALBUMIN: 3.6 g/dL (ref 3.5–5.0)
ALT: 14 U/L (ref 0–44)
AST: 18 U/L (ref 15–41)
Alkaline Phosphatase: 75 U/L (ref 38–126)
Anion gap: 6 (ref 5–15)
BUN: 15 mg/dL (ref 6–20)
CO2: 22 mmol/L (ref 22–32)
Calcium: 8 mg/dL — ABNORMAL LOW (ref 8.9–10.3)
Chloride: 109 mmol/L (ref 98–111)
Creatinine, Ser: 0.82 mg/dL (ref 0.44–1.00)
GFR calc Af Amer: >60 mL/min (ref >60)
GFR calc non Af Amer: >60 mL/min (ref >60)
GLUCOSE: 83 mg/dL (ref 70–99)
Potassium: 3.4 mmol/L — ABNORMAL LOW (ref 3.5–5.1)
Sodium: 137 mmol/L (ref 135–145)
Total Bilirubin: 0.6 mg/dL (ref 0.3–1.2)
Total Protein: 6.4 g/dL — ABNORMAL LOW (ref 6.5–8.1)

## 2018-09-25 LAB — MRSA PCR SCREENING: MRSA by PCR: NEGATIVE

## 2018-09-25 LAB — HIV ANTIBODY (ROUTINE TESTING W REFLEX): HIV Screen 4th Generation wRfx: NONREACTIVE

## 2018-09-25 LAB — TROPONIN I: Troponin I: <0.03 ng/mL (ref <0.03)

## 2018-09-25 LAB — MAGNESIUM: Magnesium: 2 mg/dL (ref 1.7–2.4)

## 2018-09-25 LAB — ABO/RH: ABO/RH(D): O POS

## 2018-09-25 LAB — PHOSPHORUS: Phosphorus: 2.8 mg/dL (ref 2.5–4.6)

## 2018-09-25 LAB — TSH: TSH: 1.375 u[IU]/mL (ref 0.350–4.500)

## 2018-09-25 MED ORDER — ORAL CARE MOUTH RINSE
15.0000 mL | Freq: Two times a day (BID) | OROMUCOSAL | Status: DC
  Administered 2018-09-25 – 2018-09-27 (×3): 15 mL via OROMUCOSAL

## 2018-09-25 MED ORDER — SODIUM CHLORIDE 0.9 % IV SOLN
INTRAVENOUS | Status: AC
  Administered 2018-09-25: 08:00:00 via INTRAVENOUS

## 2018-09-25 MED ORDER — DICYCLOMINE HCL 20 MG PO TABS
20.0000 mg | ORAL_TABLET | Freq: Once | ORAL | Status: AC
  Administered 2018-09-25: 20 mg via ORAL
  Filled 2018-09-25: qty 1

## 2018-09-25 MED ORDER — HYDRALAZINE HCL 20 MG/ML IJ SOLN
10.0000 mg | Freq: Three times a day (TID) | INTRAMUSCULAR | Status: DC | PRN
  Administered 2018-09-25: 10 mg via INTRAVENOUS
  Filled 2018-09-25: qty 1

## 2018-09-25 MED ORDER — HYDROMORPHONE HCL 1 MG/ML IJ SOLN
0.5000 mg | INTRAMUSCULAR | Status: DC | PRN
  Administered 2018-09-26 – 2018-09-27 (×3): 0.5 mg via INTRAVENOUS
  Filled 2018-09-25: qty 1
  Filled 2018-09-25: qty 0.5
  Filled 2018-09-25 (×2): qty 1

## 2018-09-25 NOTE — Progress Notes (Signed)
Pt with bradycardia rate 40 - 58, c/o of slight nausea, ongoing H/A- treated with Tylenol and then Dilaudid.  Also c/o of numbness left side of face, Neuro exam unremarkable.  B/P 189/89.  Hx of HTN.  MD notified , Hydralazine ordered.

## 2018-09-25 NOTE — Consult Note (Signed)
Subjective:   HPI  The patient is a 48 year old female with a history of diverticulosis. She states that several years ago she was hospitalized and treated for diverticulitis. She presented to the emergency room with complaints of lower gastrointestinal bleeding. She had several episodes yesterday of bloody stools. She was also having some lower and mid abdominal discomfort. She has not passed any blood in the stool since 8 PM last night. She was admitted for further evaluation.  Review of Systems No chest pain or shortness of breath  Past Medical History:  Diagnosis Date  . Hypertension   . Lupus [facility])    Past Surgical History:  Procedure Laterality Date  . ABDOMINAL HYSTERECTOMY    . BREAST REDUCTION SURGERY    . CHOLECYSTECTOMY    . TOE SURGERY Right   . TUBAL LIGATION     Social History   Socioeconomic History  . Marital status: Divorced    Spouse name: Not on file  . Number of children: Not on file  . Years of education: Not on file  . Highest education level: Not on file  Occupational History  . Not on file  Social Needs  . Financial resource strain: Not on file  . Food insecurity:    Worry: Not on file    Inability: Not on file  . Transportation needs:    Medical: Not on file    Non-medical: Not on file  Tobacco Use  . Smoking status: Never Smoker  . Smokeless tobacco: Never Used  Substance and Sexual Activity  . Alcohol use: Not Currently  . Drug use: Never  . Sexual activity: Not on file  Lifestyle  . Physical activity:    Days per week: Not on file    Minutes per session: Not on file  . Stress: Not on file  Relationships  . Social connections:    Talks on phone: Not on file    Gets together: Not on file    Attends religious service: Not on file    Active member of club or organization: Not on file    Attends meetings of clubs or organizations: Not on file    Relationship status: Not on file  . Intimate partner violence:    Fear of current or ex  partner: Not on file    Emotionally abused: Not on file    Physically abused: Not on file    Forced sexual activity: Not on file  Other Topics Concern  . Not on file  Social History Narrative  . Not on file   family history includes Breast cancer in an other family member; CAD in an other family member; Colon cancer in an other family member; Coronary artery disease in an other family member; Diabetes in an other family member; Hypertension in an other family member; Prostate cancer in an other family member; Rectal cancer in an other family member; Stroke in an other family member.  Current Facility-Administered Medications:  .  0.9 %  sodium chloride infusion, , Intravenous, Continuous, Samtani, Jai-Gurmukh, MD, Last Rate: 100 mL/hr at 09/25/18 0809 .  acetaminophen (TYLENOL) tablet 650 mg, 650 mg, Oral, Q6H PRN, 650 mg at 09/25/18 0944 **OR** acetaminophen (TYLENOL) suppository 650 mg, 650 mg, Rectal, Q6H PRN, Doutova, Anastassia, MD .  HYDROmorphone (DILAUDID) injection 0.5 mg, 0.5 mg, Intravenous, Q3H PRN, Mahala Menghini, Jai-Gurmukh, MD .  MEDLINE mouth rinse, 15 mL, Mouth Rinse, BID, Samtani, Jai-Gurmukh, MD .  Melatonin TABS 3 mg, 3 mg, Oral, QHS PRN, Kirby-Graham,  Beather Arbour, NP .  ondansetron [facility]) tablet 4 mg, 4 mg, Oral, Q6H PRN **OR** ondansetron (ZOFRAN) injection 4 mg, 4 mg, Intravenous, Q6H PRN, Doutova, Anastassia, MD .  sodium chloride flush (NS) 0.9 % injection 3 mL, 3 mL, Intravenous, Once, Doutova, Anastassia, MD Allergies  Allergen Reactions  . Prochlorperazine Other (See Comments)    Muscle stiffness     Objective:     BP (!) 131/92   Pulse 76   Temp 98.3 F (36.8 C) (Oral)   Resp 18   Ht 6' (1.829 m)   Wt 88.5 kg   SpO2 97%   BMI 26.45 kg/m   No acute distress  Heart regular rhythm no murmurs  Lungs clear  Abdomen: Bowel sounds present, soft, nontender  Laboratory No components found for: D1    Assessment:     Lower GI bleed. This is most probably  of diverticular origin.      Plan:     Continue supportive care. The bleeding seems to have called down. I will plan colonoscopy day after tomorrow. Should she have significant bleeding in the interim I would recommend a nuclear medicine GI bleeding scan and if positive calling IR for angiogram with embolization. Lab Results  Component Value Date   HGB 10.6 (L) 09/25/2018   HGB 10.2 (L) 09/25/2018   HGB 10.3 (L) 09/24/2018   HCT 33.8 (L) 09/25/2018   HCT 32.3 (L) 09/25/2018   HCT 32.6 (L) 09/24/2018   ALKPHOS 75 09/25/2018   ALKPHOS 85 09/24/2018   AST 18 09/25/2018   AST 20 09/24/2018   ALT 14 09/25/2018   ALT 16 09/24/2018

## 2018-09-25 NOTE — Progress Notes (Signed)
[facility] PROGRESS NOTE  Crystal Winters IAX:[id number] DOB: February 09, 1971 DOA: 09/24/2018 PCP: System, Pcp Not In   Narrative: 48 year old African-American female Known history of lupus on plaquenilt-no current rheumatologist HTN Depression  Came to the emergency room at [facility] long with abdominal pain cramping and rectal bleeding-Source felt to be lower GI bleed?  Diverticular Hemoglobin when admitted was 12.4 BUN/creatinine 15/0.8 LFTs are normal troponins negative CT scan abdomen pelvis showed descending and sigmoid diverticulosis without contrast   A & Plan Lower GI bleed probably Diverticular per GI hemodyn stable at this time with no evidecne recurrent bleed Keep on saline 100 cc/h and clears as per GI--possible colonoscopy 2/20 per Dr. Evette Cristal discussion with me Transfusion threshold below 7 main sym now Nausea Episodic syncope Unclear if vague old or secondary to blood loss-keep on telemetry and continue saline Negative troponins and reassuring EKG argue against neurocardiac etiology Can d/c tele as is benign from my perspective after review on monitors this am Mild AKI BUN/creatinine slightly elevated above baseline on admission Trending downward continue saline as above Anemia of acute blood loss-no shock Patient is actually hypertensive Transfer to MEd surg Mild hypokalemia Replaced with p.o. K. Dur x1 Will recheck in a.m. SLE Will need establishment with family physician and referral to rheumatology Hold plaquenil for the time being BMI 26  SCDs, presumed full code, inpatient on med surg Expect will be stable to transfer to Med-Surg later today Inpatient pending consultant input  Mahala Menghini, MD  [facility] Via amion app OR [email or web address] 7PM-7AM contact night coverage as above 09/25/2018, 7:33 AM  LOS: 0 days   Consultants:  [facility] gastroenterology  Procedures:  CT scan abdomen pelvis 2/17 IMPRESSION: 1. No definite CT findings to explain  abdominal pain and rectal bleeding.  2. Descending and sigmoid diverticulosis without intraluminal contrast or other findings to specifically localize rectal bleeding on this non tailored examination for evaluation of GI bleeding.  Antimicrobials:  None  Interval history/Subjective: Awake alert coherent some mild N and abd tenderness No dark stool since last pm tol clears  Objective:  Vitals:  Vitals:   09/25/18 0000 09/25/18 0447  BP:    Pulse:    Resp:    Temp: 98.1 F (36.7 C) 98 F (36.7 C)  SpO2:      Exam:   alert pleasant no distress  No ict no pallor No rales no rhonchi abd slight tender in LLQ Neuro intact moves 4 limbs equally   I have personally reviewed the following:  DATA   Labs:  Hemoglobin is down from 12.4-10.2  BUN/creatinine down from 17/1.02--> 15/0.8    Scheduled Meds: . sodium chloride flush  3 mL Intravenous Once   Continuous Infusions:  Active Problems:   Lower GI bleed   Essential hypertension   Syncope   Lupus (HCC)   Hypokalemia   LOS: 0 days

## 2018-09-25 NOTE — Progress Notes (Signed)
Headache, nausea and facial numbness resolved

## 2018-09-26 LAB — CBC WITH DIFFERENTIAL/PLATELET
Abs Immature Granulocytes: 0.03 10*3/uL (ref 0.00–0.07)
Basophils Absolute: 0 10*3/uL (ref 0.0–0.1)
Basophils Relative: 0 %
Eosinophils Absolute: 0.1 10*3/uL (ref 0.0–0.5)
Eosinophils Relative: 1 %
HEMATOCRIT: 33 % — AB (ref 36.0–46.0)
HEMOGLOBIN: 10.5 g/dL — AB (ref 12.0–15.0)
Immature Granulocytes: 0 %
LYMPHS PCT: 17 %
Lymphs Abs: 1.3 10*3/uL (ref 0.7–4.0)
MCH: 28.3 pg (ref 26.0–34.0)
MCHC: 31.8 g/dL (ref 30.0–36.0)
MCV: 88.9 fL (ref 80.0–100.0)
Monocytes Absolute: 0.4 10*3/uL (ref 0.1–1.0)
Monocytes Relative: 5 %
NEUTROS ABS: 5.7 10*3/uL (ref 1.7–7.7)
Neutrophils Relative %: 77 %
Platelets: 190 10*3/uL (ref 150–400)
RBC: 3.71 MIL/uL — ABNORMAL LOW (ref 3.87–5.11)
RDW: 11.8 % (ref 11.5–15.5)
WBC: 7.5 10*3/uL (ref 4.0–10.5)
nRBC: 0 % (ref 0.0–0.2)

## 2018-09-26 LAB — BASIC METABOLIC PANEL
Anion gap: 8 (ref 5–15)
BUN: 12 mg/dL (ref 6–20)
CO2: 23 mmol/L (ref 22–32)
Calcium: 8.6 mg/dL — ABNORMAL LOW (ref 8.9–10.3)
Chloride: 107 mmol/L (ref 98–111)
Creatinine, Ser: 0.81 mg/dL (ref 0.44–1.00)
GFR calc Af Amer: >60 mL/min (ref >60)
GFR calc non Af Amer: >60 mL/min (ref >60)
Glucose, Bld: 92 mg/dL (ref 70–99)
POTASSIUM: 3 mmol/L — AB (ref 3.5–5.1)
Sodium: 138 mmol/L (ref 135–145)

## 2018-09-26 LAB — MAGNESIUM: Magnesium: 1.9 mg/dL (ref 1.7–2.4)

## 2018-09-26 MED ORDER — PANTOPRAZOLE SODIUM 40 MG PO TBEC
40.0000 mg | DELAYED_RELEASE_TABLET | Freq: Two times a day (BID) | ORAL | Status: DC
  Administered 2018-09-26 – 2018-09-27 (×4): 40 mg via ORAL
  Filled 2018-09-26 (×4): qty 1

## 2018-09-26 MED ORDER — SODIUM CHLORIDE 0.9 % IV SOLN: INTRAVENOUS | Status: DC

## 2018-09-26 MED ORDER — TIZANIDINE HCL 4 MG PO TABS
4.0000 mg | ORAL_TABLET | Freq: Three times a day (TID) | ORAL | Status: DC
  Administered 2018-09-26 – 2018-09-28 (×6): 4 mg via ORAL
  Filled 2018-09-26 (×6): qty 1

## 2018-09-26 MED ORDER — PROMETHAZINE HCL 25 MG/ML IJ SOLN
12.5000 mg | Freq: Four times a day (QID) | INTRAMUSCULAR | Status: DC | PRN
  Administered 2018-09-26 (×2): 12.5 mg via INTRAVENOUS
  Filled 2018-09-26 (×2): qty 1

## 2018-09-26 MED ORDER — HYDROXYCHLOROQUINE SULFATE 200 MG PO TABS
200.0000 mg | ORAL_TABLET | Freq: Two times a day (BID) | ORAL | Status: DC
  Administered 2018-09-26 – 2018-09-28 (×5): 200 mg via ORAL
  Filled 2018-09-26 (×5): qty 1

## 2018-09-26 MED ORDER — POTASSIUM CHLORIDE CRYS ER 20 MEQ PO TBCR
40.0000 meq | EXTENDED_RELEASE_TABLET | ORAL | Status: AC
  Administered 2018-09-26: 40 meq via ORAL
  Filled 2018-09-26 (×2): qty 2

## 2018-09-26 MED ORDER — PEG 3350-KCL-NA BICARB-NACL 420 G PO SOLR
4000.0000 mL | Freq: Once | ORAL | Status: AC
  Administered 2018-09-26: 4000 mL via ORAL
  Filled 2018-09-26: qty 4000

## 2018-09-26 MED ORDER — PREDNISONE 5 MG PO TABS
7.5000 mg | ORAL_TABLET | Freq: Every day | ORAL | Status: DC
  Administered 2018-09-26 – 2018-09-28 (×3): 7.5 mg via ORAL
  Filled 2018-09-26 (×3): qty 2

## 2018-09-26 NOTE — Progress Notes (Signed)
[facility] Progress Note  Subjective: Passing some old dark blood. No red blood  Objective: Vital signs in last 24 hours: Temp:  [97.8 F (36.6 C)-98.4 F (36.9 C)] 98.4 F (36.9 C) (02/19 0800) Pulse Rate:  [55-73] 64 (02/19 0200) Resp:  [10-18] 12 (02/19 0200) BP: (122-181)/(81-113) 122/81 (02/19 0020) SpO2:  [95 %-100 %] 99 % (02/19 0200) Weight change:    PE: No distress  Lab Results: Results for orders placed or performed during the hospital encounter of 09/24/18 (from the past 24 hour(s))  CBC     Status: Abnormal   Collection Time: 09/25/18 10:09 AM  Result Value Ref Range   WBC 5.2 4.0 - 10.5 K/uL   RBC 3.79 (L) 3.87 - 5.11 MIL/uL   Hemoglobin 10.6 (L) 12.0 - 15.0 g/dL   HCT 68.1 (L) 15.7 - 26.2 %   MCV 89.2 80.0 - 100.0 fL   MCH 28.0 26.0 - 34.0 pg   MCHC 31.4 30.0 - 36.0 g/dL   RDW 03.5 59.7 - 41.6 %   Platelets 188 150 - 400 K/uL   nRBC 0.0 0.0 - 0.2 %  CBC with Differential/Platelet     Status: Abnormal   Collection Time: 09/26/18  2:45 AM  Result Value Ref Range   WBC 7.5 4.0 - 10.5 K/uL   RBC 3.71 (L) 3.87 - 5.11 MIL/uL   Hemoglobin 10.5 (L) 12.0 - 15.0 g/dL   HCT 38.4 (L) 53.6 - 46.8 %   MCV 88.9 80.0 - 100.0 fL   MCH 28.3 26.0 - 34.0 pg   MCHC 31.8 30.0 - 36.0 g/dL   RDW 03.2 12.2 - 48.2 %   Platelets 190 150 - 400 K/uL   nRBC 0.0 0.0 - 0.2 %   Neutrophils Relative % 77 %   Neutro Abs 5.7 1.7 - 7.7 K/uL   Lymphocytes Relative 17 %   Lymphs Abs 1.3 0.7 - 4.0 K/uL   Monocytes Relative 5 %   Monocytes Absolute 0.4 0.1 - 1.0 K/uL   Eosinophils Relative 1 %   Eosinophils Absolute 0.1 0.0 - 0.5 K/uL   Basophils Relative 0 %   Basophils Absolute 0.0 0.0 - 0.1 K/uL   Immature Granulocytes 0 %   Abs Immature Granulocytes 0.03 0.00 - 0.07 K/uL  Basic metabolic panel     Status: Abnormal   Collection Time: 09/26/18  2:45 AM  Result Value Ref Range   Sodium 138 135 - 145 mmol/L   Potassium 3.0 (L) 3.5 - 5.1 mmol/L   Chloride 107 98 -  111 mmol/L   CO2 23 22 - 32 mmol/L   Glucose, Bld 92 70 - 99 mg/dL   BUN 12 6 - 20 mg/dL   Creatinine, Ser 5.00 0.44 - 1.00 mg/dL   Calcium 8.6 (L) 8.9 - 10.3 mg/dL   GFR calc non Af Amer >60 >60 mL/min   GFR calc Af Amer >60 >60 mL/min   Anion gap 8 5 - 15    Studies/Results: No results found.    Assessment: Lower GI bleed  Plan:   Colonoscopy tomorrow.    Crystal Winters 09/26/2018, 9:21 AM  Pager: [phone] If no answer or after 5 PM call [phone]

## 2018-09-26 NOTE — Progress Notes (Signed)
PROGRESS NOTE  Crystal Winters ZOX:[id number] DOB: 1971/01/11 DOA: 09/24/2018 PCP: System, Pcp Not In   LOS: 1 day   Brief Narrative / Interim history: 48 year old female with history of lupus on Plaquenil, hypertension and depression presenting with abdominal pain and hematochezia thought to be diverticular.  Hemoglobin 12.4 on admission are remained stable at 10.5 of the initial drop.  Initially with hematochezia.  CT abdomen and pelvis without contrast showed descending and sigmoid diverticulosis.  GI on board.   Subjective: No major events overnight.  Feels multiple joint pains.  She thinks she might have flareup of her lupus.  Off Plaquenil since admission.  Denies chest pain, shortness of breath or hematochezia.  Reports cramping abdominal pain and melena.  Assessment & Plan: Active Problems:   Lower GI bleed   Essential hypertension   Syncope   Lupus (HCC)   Hypokalemia  Lower GI bleed/hematochezia/melena: Likely diverticular.  CT abdomen showed sigmoid diverticulosis.  Hemoglobin is stable except for initial drop.  Hemodynamically stable. -Plan for colonoscopy on 1/20 by GI -Start Protonix twice daily -N.p.o. after midnight  Mild AKI: Resolved. -Trend renal function as needed.  Polyarticular pain/SLE: Concern for lupus flareup.  Off her Plaquenil since admission. -Resume home Plaquenil at 200 mg twice daily -Start prednisone 7.5 mg daily.  Hypokalemia -Replace and recheck. -Magnesium within normal range.  Overweight: BMI 26. -Outpatient follow-up with PCP.  Episodic syncope: Unclear etiology.  Lupus can contribute to this.  Troponin and EKG reassuring.  No event on telemetry and telemetry was discontinued. -We will continue monitoring. -We will consider orthostatic vitals and outpatient event monitor if it recurs.  Scheduled Meds: . hydroxychloroquine  200 mg Oral BID  . mouth rinse  15 mL Mouth Rinse BID  . polyethylene glycol-electrolytes  4,000 mL Oral Once    . potassium chloride  40 mEq Oral Q4H  . predniSONE  7.5 mg Oral Q breakfast  . sodium chloride flush  3 mL Intravenous Once  . tiZANidine  4 mg Oral TID   Continuous Infusions: PRN Meds:.acetaminophen **OR** acetaminophen, hydrALAZINE, HYDROmorphone (DILAUDID) injection, Melatonin, ondansetron **OR** ondansetron (ZOFRAN) IV  DVT prophylaxis: SCD due to GI bleed Code Status: Full code Family Communication: None at bedside Disposition Plan: Transfer to telemetry bed  Consultants:   Gastroenterology  Procedures:   Colonoscopy planned for 1/20  Antimicrobials:  None  Objective: Vitals:   09/26/18 0020 09/26/18 0200 09/26/18 0331 09/26/18 0800  BP: 122/81     Pulse: 61 64    Resp: 18 12    Temp:   97.8 F (36.6 C) 98.4 F (36.9 C)  TempSrc:   Oral Oral  SpO2: 97% 99%    Weight:      Height:        Intake/Output Summary (Last 24 hours) at 09/26/2018 1111 Last data filed at 09/25/2018 1500 Gross per 24 hour  Intake 660.58 ml  Output -  Net 660.58 ml   Filed Weights   09/24/18 1138  Weight: 88.5 kg    Examination:  GENERAL: Appears well. No acute distress.  EYES - vision grossly intact. Sclera anicteric.  NOSE- no gross deformity or drainage MOUTH - no oral lesions noted THROAT- no swelling or erythema LUNGS:  No IWOB. Good air movement. CTAB.  HEART:  Heart sounds normal. ABD: Bowel sounds present. Soft. Non tender.  MSK/EXT: Moves extremities. No obvious deformity. SKIN: no apparent skin lesion.  NEURO: Awake, alert and oriented appropriately.  No gross deficit.  PSYCH: Calm.  Normal affect.   Foley: None  Access: Peripheral IV line   Data Reviewed: I have independently reviewed following labs and imaging studies  CBC: Recent Labs  Lab 09/24/18 1145 09/24/18 2101 09/25/18 0120 09/25/18 1009 09/26/18 0245  WBC 8.2 9.7 7.7 5.2 7.5  NEUTROABS  --   --   --   --  5.7  HGB 12.4 10.3* 10.2* 10.6* 10.5*  HCT 38.8 32.6* 32.3* 33.8* 33.0*  MCV  87.8 88.8 90.0 89.2 88.9  PLT 251 200 188 188 190   Basic Metabolic Panel: Recent Labs  Lab 09/24/18 1145 09/24/18 2101 09/25/18 0120 09/26/18 0245  NA 140  --  137 138  K 3.5  --  3.4* 3.0*  CL 108  --  109 107  CO2 26  --  22 23  GLUCOSE 106*  --  83 92  BUN 17  --  15 12  CREATININE 1.02*  --  0.82 0.81  CALCIUM 8.4*  --  8.0* 8.6*  MG  --  2.1 2.0 1.9  PHOS  --   --  2.8  --    GFR: Estimated Creatinine Clearance: 106.3 mL/min (by C-G formula based on SCr of 0.81 mg/dL). Liver Function Tests: Recent Labs  Lab 09/24/18 1145 09/25/18 0120  AST 20 18  ALT 16 14  ALKPHOS 85 75  BILITOT 0.7 0.6  PROT 7.3 6.4*  ALBUMIN 4.1 3.6   Recent Labs  Lab 09/24/18 1145  LIPASE 29   No results for input(s): AMMONIA in the last 168 hours. Coagulation Profile: Recent Labs  Lab 09/24/18 1912  INR 1.06   Cardiac Enzymes: Recent Labs  Lab 09/24/18 2101 09/25/18 0120 09/25/18 0641  TROPONINI <0.03 <0.03 <0.03   BNP (last 3 results) No results for input(s): PROBNP in the last 8760 hours. HbA1C: No results for input(s): HGBA1C in the last 72 hours. CBG: Recent Labs  Lab 09/24/18 1232  GLUCAP 122*   Lipid Profile: No results for input(s): CHOL, HDL, LDLCALC, TRIG, CHOLHDL, LDLDIRECT in the last 72 hours. Thyroid Function Tests: Recent Labs    09/25/18 0120  TSH 1.375   Anemia Panel: No results for input(s): VITAMINB12, FOLATE, FERRITIN, TIBC, IRON, RETICCTPCT in the last 72 hours. Urine analysis:    Component Value Date/Time   COLORURINE AMBER (A) 09/24/2018 1154   APPEARANCEUR HAZY (A) 09/24/2018 1154   LABSPEC 1.032 (H) 09/24/2018 1154   PHURINE 5.0 09/24/2018 1154   GLUCOSEU NEGATIVE 09/24/2018 1154   HGBUR NEGATIVE 09/24/2018 1154   BILIRUBINUR SMALL (A) 09/24/2018 1154   KETONESUR 5 (A) 09/24/2018 1154   PROTEINUR 30 (A) 09/24/2018 1154   NITRITE NEGATIVE 09/24/2018 1154   LEUKOCYTESUR NEGATIVE 09/24/2018 1154   Sepsis Labs: Invalid input(s):  PROCALCITONIN, LACTICIDVEN  Recent Results (from the past 240 hour(s))  MRSA PCR Screening     Status: None   Collection Time: 09/25/18 12:38 AM  Result Value Ref Range Status   MRSA by PCR NEGATIVE NEGATIVE Final    Comment:        The GeneXpert MRSA Assay (FDA approved for NASAL specimens only), is one component of a comprehensive MRSA colonization surveillance program. It is not intended to diagnose MRSA infection nor to guide or monitor treatment for MRSA infections. Performed at [facility], [address], [place], [place] [address]       Radiology Studies: No results found.  Megumi Treaster T. [facility] Pager [phone]  If 7PM-7AM, please contact night-coverage [email or web address]  Password [email or web address] 09/26/2018, 11:11 AM

## 2018-09-27 ENCOUNTER — Encounter (HOSPITAL_COMMUNITY): Payer: Self-pay | Admitting: *Deleted

## 2018-09-27 ENCOUNTER — Encounter (HOSPITAL_COMMUNITY)
Admission: EM | Disposition: A | Discharge status: Home / Self Care | Payer: Self-pay | Source: Home / Self Care | Attending: Student

## 2018-09-27 ENCOUNTER — Inpatient Hospital Stay (HOSPITAL_COMMUNITY): Payer: Self-pay | Admitting: Anesthesiology

## 2018-09-27 HISTORY — PX: COLONOSCOPY WITH PROPOFOL: SHX5780

## 2018-09-27 LAB — BASIC METABOLIC PANEL
Anion gap: 8 (ref 5–15)
BUN: 21 mg/dL — ABNORMAL HIGH (ref 6–20)
CO2: 26 mmol/L (ref 22–32)
CREATININE: 1.38 mg/dL — AB (ref 0.44–1.00)
Calcium: 8.8 mg/dL — ABNORMAL LOW (ref 8.9–10.3)
Chloride: 105 mmol/L (ref 98–111)
GFR calc Af Amer: 52 mL/min — ABNORMAL LOW (ref >60)
GFR calc non Af Amer: 45 mL/min — ABNORMAL LOW (ref >60)
Glucose, Bld: 81 mg/dL (ref 70–99)
Potassium: 3.5 mmol/L (ref 3.5–5.1)
Sodium: 139 mmol/L (ref 135–145)

## 2018-09-27 LAB — CBC
HCT: 34.7 % — ABNORMAL LOW (ref 36.0–46.0)
Hemoglobin: 10.8 g/dL — ABNORMAL LOW (ref 12.0–15.0)
MCH: 27.8 pg (ref 26.0–34.0)
MCHC: 31.1 g/dL (ref 30.0–36.0)
MCV: 89.4 fL (ref 80.0–100.0)
Platelets: 201 10*3/uL (ref 150–400)
RBC: 3.88 MIL/uL (ref 3.87–5.11)
RDW: 11.9 % (ref 11.5–15.5)
WBC: 6.6 10*3/uL (ref 4.0–10.5)
nRBC: 0 % (ref 0.0–0.2)

## 2018-09-27 SURGERY — COLONOSCOPY WITH PROPOFOL
Anesthesia: Monitor Anesthesia Care

## 2018-09-27 MED ORDER — PANTOPRAZOLE SODIUM 40 MG PO TBEC
40.0000 mg | DELAYED_RELEASE_TABLET | Freq: Every day | ORAL | Status: DC
  Administered 2018-09-28: 40 mg via ORAL
  Filled 2018-09-27: qty 1

## 2018-09-27 MED ORDER — PROPOFOL 10 MG/ML IV BOLUS
INTRAVENOUS | Status: AC
  Filled 2018-09-27: qty 60

## 2018-09-27 MED ORDER — PROPOFOL 10 MG/ML IV BOLUS
INTRAVENOUS | Status: AC
  Filled 2018-09-27: qty 20

## 2018-09-27 MED ORDER — PROPOFOL 500 MG/50ML IV EMUL
INTRAVENOUS | Status: DC | PRN
  Administered 2018-09-27: 125 ug/kg/min via INTRAVENOUS

## 2018-09-27 MED ORDER — SODIUM CHLORIDE 0.9 % IV SOLN
INTRAVENOUS | Status: DC
  Administered 2018-09-27: 10:00:00 via INTRAVENOUS

## 2018-09-27 MED ORDER — PROPOFOL 10 MG/ML IV BOLUS
INTRAVENOUS | Status: DC | PRN
  Administered 2018-09-27 (×2): 30 mg via INTRAVENOUS
  Administered 2018-09-27: 50 mg via INTRAVENOUS

## 2018-09-27 MED ORDER — LACTATED RINGERS IV SOLN
INTRAVENOUS | Status: DC | PRN
  Administered 2018-09-27: 13:00:00 via INTRAVENOUS

## 2018-09-27 SURGICAL SUPPLY — 22 items

## 2018-09-27 NOTE — Transfer of Care (Signed)
Immediate Anesthesia Transfer of Care Note  Patient: Crystal Winters  Procedure(s) Performed: COLONOSCOPY WITH PROPOFOL (N/A )  Patient Location: PACU and Endoscopy Unit  Anesthesia Type:MAC  Level of Consciousness: awake, alert  and oriented  Airway & Oxygen Therapy: Patient Spontanous Breathing  Post-op Assessment: Report given to RN and Post -op Vital signs reviewed and stable  Post vital signs: Reviewed and stable  Last Vitals:  Vitals Value Taken Time  BP    Temp    Pulse 73 09/27/2018  1:11 PM  Resp 13 09/27/2018  1:11 PM  SpO2 98 % 09/27/2018  1:11 PM  Vitals shown include unvalidated device data.  Last Pain:  Vitals:   09/27/18 1141  TempSrc: Oral  PainSc: 0-No pain      Patients Stated Pain Goal: 3 (33/54/56 2563)  Complications: No apparent anesthesia complications

## 2018-09-27 NOTE — Anesthesia Preprocedure Evaluation (Signed)
Anesthesia Evaluation  Patient identified by MRN, date of birth, ID band Patient awake    Reviewed: Allergy & Precautions, NPO status , Patient's Chart, lab work & pertinent test results  Airway Mallampati: II  TM Distance: >3 FB Neck ROM: Full    Dental no notable dental hx.    Pulmonary neg pulmonary ROS,    Pulmonary exam normal breath sounds clear to auscultation       Cardiovascular hypertension, Normal cardiovascular exam Rhythm:Regular Rate:Normal     Neuro/Psych negative neurological ROS  negative psych ROS   GI/Hepatic negative GI ROS, Neg liver ROS,   Endo/Other  negative endocrine ROS  Renal/GU negative Renal ROS  negative genitourinary   Musculoskeletal negative musculoskeletal ROS (+)   Abdominal   Peds negative pediatric ROS (+)  Hematology  (+) anemia ,   Anesthesia Other Findings   Reproductive/Obstetrics negative OB ROS                             Anesthesia Physical Anesthesia Plan  ASA: II  Anesthesia Plan: MAC   Post-op Pain Management:    Induction: Intravenous  PONV Risk Score and Plan: 0  Airway Management Planned: Nasal Cannula  Additional Equipment:   Intra-op Plan:   Post-operative Plan:   Informed Consent: I have reviewed the patients History and Physical, chart, labs and discussed the procedure including the risks, benefits and alternatives for the proposed anesthesia with the patient or authorized representative who has indicated his/her understanding and acceptance.     Dental advisory given  Plan Discussed with: CRNA and Surgeon  Anesthesia Plan Comments:         Anesthesia Quick Evaluation

## 2018-09-27 NOTE — Op Note (Signed)
[facility] Patient Name: Crystal Winters Procedure Date: 09/27/2018 MRN: [id number] Attending MD: Graylin Shiver , MD Date of Birth: 1971/06/27 CSN: [id number] Age: 48 Admit Type: Inpatient Procedure:                Colonoscopy Indications:              Rectal bleeding Providers:                Graylin Shiver, MD, April Holding, RN, Verita Schneiders, Technician, Stephanie [place], CRNA Referring MD:              Medicines:                Propofol per Anesthesia Complications:            No immediate complications. Estimated Blood Loss:     Estimated blood loss: none. Procedure:                Pre-Anesthesia Assessment:                           - Prior to the procedure, a History and Physical                            was performed, and patient medications and                            allergies were reviewed. The patient's tolerance of                            previous anesthesia was also reviewed. The risks                            and benefits of the procedure and the sedation                            options and risks were discussed with the patient.                            All questions were answered, and informed consent                            was obtained. Prior Anticoagulants: The patient has                            taken no previous anticoagulant or antiplatelet                            agents. ASA Grade Assessment: II - A patient with                            mild systemic disease. After reviewing the risks  and benefits, the patient was deemed in                            satisfactory condition to undergo the procedure.                           After obtaining informed consent, the colonoscope                            was passed under direct vision. Throughout the                            procedure, the patient's blood pressure, pulse, and                            oxygen  saturations were monitored continuously. The                            CF-[id number] ([id number] Olympus colonoscope was                            introduced through the anus and advanced to the the                            cecum, identified by appendiceal orifice and                            ileocecal valve. The ileocecal valve, appendiceal                            orifice, and rectum were photographed. The                            colonoscopy was performed without difficulty. The                            patient tolerated the procedure well. The quality                            of the bowel preparation was good. Scope In: 12:51:49 PM Scope Out: 1:03:37 PM Scope Withdrawal Time: 0 hours 5 minutes 19 seconds  Total Procedure Duration: 0 hours 11 minutes 48 seconds  Findings:      The perianal and digital rectal examinations were normal.      Multiple diverticula were found in the sigmoid colon and descending       colon.      The exam was otherwise without abnormality. Impression:               - Diverticulosis in the sigmoid colon and in the                            descending colon.                           - The examination was otherwise  normal.                           - No specimens collected. Moderate Sedation:      . Recommendation:           - Resume regular diet.                           - Continue present medications.                           - Repeat colonoscopy in 10 years for screening                            purposes. Procedure Code(s):        --- Professional ---                           [phone], Colonoscopy, flexible; diagnostic, including                            collection of specimen(s) by brushing or washing,                            when performed (separate procedure) Diagnosis Code(s):        --- Professional ---                           K62.5, Hemorrhage of anus and rectum                           K57.30, Diverticulosis of large intestine  without                            perforation or abscess without bleeding CPT copyright 2018 [employer]. All rights reserved. The codes documented in this report are preliminary and upon coder review may  be revised to meet current compliance requirements. Graylin Shiver, MD 09/27/2018 1:09:31 PM This report has been signed electronically. Number of Addenda: 0

## 2018-09-27 NOTE — Progress Notes (Signed)
PROGRESS NOTE  Crystal Winters MAY:[id number] DOB: 1971/07/18 DOA: 09/24/2018 PCP: System, Pcp Not In   LOS: 2 days   Brief Narrative / Interim history: 48 year old female with history of lupus on Plaquenil, hypertension and depression presenting with abdominal pain and hematochezia thought to be diverticular.  Hemoglobin 12.4 on admission are remained stable at 10.5 of the initial drop.  Initially with hematochezia.  CT abdomen and pelvis without contrast showed descending and sigmoid diverticulosis.  GI on board.   Subjective: No major events overnight.  Had one episode of emesis yesterday evening.  No further emesis. Reports intermittent nausea.  Denies chest pain, dyspnea or abdominal pain.  Reports significant watery and loose bowel movement after colonoscopy prep.  Assessment & Plan: Active Problems:   Lower GI bleed   Essential hypertension   Syncope   Lupus (HCC)   Hypokalemia  Diverticular bleed: Stable.  Hemoglobin stable.   -CT abdomen showed sigmoid diverticulosis.   -Colonoscopy on 2/20 showed diverticulosis but no other finding. Hemoglobin is stable except for initial drop.  Hemodynamically stable. -We will resume clear liquid diet  AKI: Serum creatinine trended from 0.81-1.38.  Suspect this to be prerenal in setting of GI loss versus lupus flareup. -IV fluid at 125 cc/h -Recheck renal function  Polyarticular pain/SLE: Concern for lupus flareup.  Off her Plaquenil since admission. -Resumed home Plaquenil at 200 mg twice daily 2/19. -Started prednisone 7.5 mg daily on 2/19  Hypokalemia: Resolved.  Overweight: BMI 26. -Outpatient follow-up with PCP.  Episodic syncope: Unclear etiology.  Lupus can contribute to this.  Troponin negative.  EKG with sinus bradycardia with occasional PVCs.  No AV block.  No event on telemetry.  -We will discuss with cardiology about event monitor.  Scheduled Meds: . hydroxychloroquine  200 mg Oral BID  . mouth rinse  15 mL Mouth  Rinse BID  . pantoprazole  40 mg Oral BID  . predniSONE  7.5 mg Oral Q breakfast  . sodium chloride flush  3 mL Intravenous Once  . tiZANidine  4 mg Oral TID   Continuous Infusions: PRN Meds:.acetaminophen **OR** acetaminophen, hydrALAZINE, HYDROmorphone (DILAUDID) injection, Melatonin, ondansetron **OR** ondansetron (ZOFRAN) IV, promethazine  DVT prophylaxis: SCD due to GI bleed Code Status: Full code Family Communication: None at bedside Disposition Plan: Transfer to telemetry bed  Consultants:   Gastroenterology  Procedures:   Colonoscopy planned for 1/20  Antimicrobials:  None  Objective: Vitals:   09/27/18 1312 09/27/18 1318 09/27/18 1329 09/27/18 1356  BP:  136/79 (!) 126/58 130/86  Pulse:  73 61 61  Resp:  16 10 18   Temp: 98.1 F (36.7 C)   97.7 F (36.5 C)  TempSrc: Oral   Oral  SpO2:  97% 99% 99%  Weight:      Height:        Intake/Output Summary (Last 24 hours) at 09/27/2018 1528 Last data filed at 09/27/2018 1312 Gross per 24 hour  Intake 1140 ml  Output -  Net 1140 ml   Filed Weights   09/24/18 1138  Weight: 88.5 kg    Examination: GENERAL: Appears well. No acute distress.  HEENT: MMM.  Vision and Hearing grossly intact.  NECK: Supple.  No JVD.  LUNGS:  No IWOB. Good air movement. CTAB.  HEART:  RRR. Heart sounds normal.  ABD: Bowel sounds present. Soft. Non tender.  EXT:   no edema bilaterally.  SKIN: no apparent skin lesion.  NEURO: Awake, alert and oriented appropriately.  No gross deficit.  PSYCH:  Calm. Normal affect.  Foley: None  Access: Peripheral IV line   Data Reviewed: I have independently reviewed following labs and imaging studies  CBC: Recent Labs  Lab 09/24/18 2101 09/25/18 0120 09/25/18 1009 09/26/18 0245 09/27/18 0442  WBC 9.7 7.7 5.2 7.5 6.6  NEUTROABS  --   --   --  5.7  --   HGB 10.3* 10.2* 10.6* 10.5* 10.8*  HCT 32.6* 32.3* 33.8* 33.0* 34.7*  MCV 88.8 90.0 89.2 88.9 89.4  PLT 200 188 188 190 201    Basic Metabolic Panel: Recent Labs  Lab 09/24/18 1145 09/24/18 2101 09/25/18 0120 09/26/18 0245 09/27/18 0442  NA 140  --  137 138 139  K 3.5  --  3.4* 3.0* 3.5  CL 108  --  109 107 105  CO2 26  --  22 23 26   GLUCOSE 106*  --  83 92 81  BUN 17  --  15 12 21*  CREATININE 1.02*  --  0.82 0.81 1.38*  CALCIUM 8.4*  --  8.0* 8.6* 8.8*  MG  --  2.1 2.0 1.9  --   PHOS  --   --  2.8  --   --    GFR: Estimated Creatinine Clearance: 62.4 mL/min (A) (by C-G formula based on SCr of 1.38 mg/dL (H)). Liver Function Tests: Recent Labs  Lab 09/24/18 1145 09/25/18 0120  AST 20 18  ALT 16 14  ALKPHOS 85 75  BILITOT 0.7 0.6  PROT 7.3 6.4*  ALBUMIN 4.1 3.6   Recent Labs  Lab 09/24/18 1145  LIPASE 29   No results for input(s): AMMONIA in the last 168 hours. Coagulation Profile: Recent Labs  Lab 09/24/18 1912  INR 1.06   Cardiac Enzymes: Recent Labs  Lab 09/24/18 2101 09/25/18 0120 09/25/18 0641  TROPONINI <0.03 <0.03 <0.03   BNP (last 3 results) No results for input(s): PROBNP in the last 8760 hours. HbA1C: No results for input(s): HGBA1C in the last 72 hours. CBG: Recent Labs  Lab 09/24/18 1232  GLUCAP 122*   Lipid Profile: No results for input(s): CHOL, HDL, LDLCALC, TRIG, CHOLHDL, LDLDIRECT in the last 72 hours. Thyroid Function Tests: Recent Labs    09/25/18 0120  TSH 1.375   Anemia Panel: No results for input(s): VITAMINB12, FOLATE, FERRITIN, TIBC, IRON, RETICCTPCT in the last 72 hours. Urine analysis:    Component Value Date/Time   COLORURINE AMBER (A) 09/24/2018 1154   APPEARANCEUR HAZY (A) 09/24/2018 1154   LABSPEC 1.032 (H) 09/24/2018 1154   PHURINE 5.0 09/24/2018 1154   GLUCOSEU NEGATIVE 09/24/2018 1154   HGBUR NEGATIVE 09/24/2018 1154   BILIRUBINUR SMALL (A) 09/24/2018 1154   KETONESUR 5 (A) 09/24/2018 1154   PROTEINUR 30 (A) 09/24/2018 1154   NITRITE NEGATIVE 09/24/2018 1154   LEUKOCYTESUR NEGATIVE 09/24/2018 1154   Sepsis  Labs: Invalid input(s): PROCALCITONIN, LACTICIDVEN  Recent Results (from the past 240 hour(s))  MRSA PCR Screening     Status: None   Collection Time: 09/25/18 12:38 AM  Result Value Ref Range Status   MRSA by PCR NEGATIVE NEGATIVE Final    Comment:        The GeneXpert MRSA Assay (FDA approved for NASAL specimens only), is one component of a comprehensive MRSA colonization surveillance program. It is not intended to diagnose MRSA infection nor to guide or monitor treatment for MRSA infections. Performed at [facility], [address], [place], [place] [address]       Radiology Studies:  No results found.  Pantelis Elgersma T. [facility] Pager [phone]  If 7PM-7AM, please contact night-coverage [email or web address] Password [email or web address] 09/27/2018, 3:28 PM

## 2018-09-27 NOTE — H&P (Signed)
The patient presents to the endoscopy department for colonoscopy to evaluate rectal bleeding  Physical:  No distress  Heart regular rhythm  Lungs clear  Abdomen soft and nontender  Impression: Rectal bleeding  Plan: Colonoscopy

## 2018-09-27 NOTE — Anesthesia Postprocedure Evaluation (Signed)
Anesthesia Post Note  Patient: Crystal Winters  Procedure(s) Performed: COLONOSCOPY WITH PROPOFOL (N/A )     Patient location during evaluation: PACU Anesthesia Type: MAC Level of consciousness: awake and alert Pain management: pain level controlled Vital Signs Assessment: post-procedure vital signs reviewed and stable Respiratory status: spontaneous breathing, nonlabored ventilation, respiratory function stable and patient connected to nasal cannula oxygen Cardiovascular status: stable and blood pressure returned to baseline Postop Assessment: no apparent nausea or vomiting Anesthetic complications: no    Last Vitals:  Vitals:   09/27/18 1318 09/27/18 1329  BP: 136/79 (!) 126/58  Pulse: 73 61  Resp: 16 10  Temp:    SpO2: 97% 99%    Last Pain:  Vitals:   09/27/18 1329  TempSrc:   PainSc: 0-No pain                 Joaquina Nissen S

## 2018-09-28 ENCOUNTER — Encounter (HOSPITAL_COMMUNITY): Payer: Self-pay | Admitting: Gastroenterology

## 2018-09-28 DIAGNOSIS — N179 Acute kidney failure, unspecified: Secondary | ICD-10-CM

## 2018-09-28 LAB — CBC
HCT: 31.4 % — ABNORMAL LOW (ref 36.0–46.0)
Hemoglobin: 9.5 g/dL — ABNORMAL LOW (ref 12.0–15.0)
MCH: 27.7 pg (ref 26.0–34.0)
MCHC: 30.3 g/dL (ref 30.0–36.0)
MCV: 91.5 fL (ref 80.0–100.0)
Platelets: 196 10*3/uL (ref 150–400)
RBC: 3.43 MIL/uL — ABNORMAL LOW (ref 3.87–5.11)
RDW: 11.9 % (ref 11.5–15.5)
WBC: 7.6 10*3/uL (ref 4.0–10.5)
nRBC: 0 % (ref 0.0–0.2)

## 2018-09-28 LAB — BASIC METABOLIC PANEL
Anion gap: 6 (ref 5–15)
BUN: 18 mg/dL (ref 6–20)
CALCIUM: 8.5 mg/dL — AB (ref 8.9–10.3)
CO2: 24 mmol/L (ref 22–32)
Chloride: 109 mmol/L (ref 98–111)
Creatinine, Ser: 0.95 mg/dL (ref 0.44–1.00)
GFR calc Af Amer: >60 mL/min (ref >60)
GFR calc non Af Amer: >60 mL/min (ref >60)
Glucose, Bld: 104 mg/dL — ABNORMAL HIGH (ref 70–99)
Potassium: 3.6 mmol/L (ref 3.5–5.1)
Sodium: 139 mmol/L (ref 135–145)

## 2018-09-28 MED ORDER — HYDROXYCHLOROQUINE SULFATE 200 MG PO TABS: 200.0000 mg | ORAL_TABLET | Freq: Two times a day (BID) | ORAL | 0 refills | Status: DC

## 2018-09-28 MED ORDER — PREDNISONE 2.5 MG PO TABS: 7.5000 mg | ORAL_TABLET | Freq: Every day | ORAL | 0 refills | Status: DC

## 2018-09-28 NOTE — Discharge Summary (Signed)
Physician Discharge Summary  Kipp Laurenceudrena Codrington BJY:[id number] DOB: July 27, 1971 DOA: 09/24/2018  PCP: System, Pcp Not In  Admit date: 09/24/2018 Discharge date: 09/28/2018  Admitted From: Home Disposition: Home  Recommendations for Outpatient Follow-up:  1. Follow up with PCP in 1-2 weeks 2. Please obtain BMP/CBC in one week 3. Ensure rheumatology follow-up 4. Please follow up on the following pending results:  Home Health: None Equipment/Devices: None  Discharge Condition: Stable CODE STATUS: Full code  Hospital Course: 48 year old female with history of lupus on Plaquenil, hypertension and depression presenting with abdominal pain and hematochezia thought to be diverticular. CT abdomen and pelvis without contrast showed descending and sigmoid diverticulosis.  Treated conservatively with bowel rest and IV fluid. Hemoglobin 12.4 on admission and remained stable at about 10 after initial drop.  GI consulted.  Colonoscopy on 09/27/2018 showed diverticulosis but no other significant finding.  She remained stable after colonoscopy and discharged to follow-up with her primary care doctor.  In regards to her lupus, patient had multiple joint pains during this hospitalization which felt like a lupus flareup.  Started on prednisone 7.5 mg for 1 week and Plaquenil 200 mg twice daily.  Patient to follow-up with rheumatology.  See individual problem list below for more.  Discharge Diagnoses:  Active Problems:   Lower GI bleed   Essential hypertension   Syncope   Lupus (HCC)   Hypokalemia  Diverticular bleed: Stable.  Hemoglobin stable.   -CT abdomen showed sigmoid diverticulosis.   -Colonoscopy on 2/20 showed diverticulosis but no other finding.  -Hemoglobin is stable except for initial drop.  Hemodynamically stable. -Tolerating soft diet -Discharge home and follow-up with PCP.  AKI: Serum creatinine peaked at 1.38 and trended down to 0.94. Suspect AKI to be prerenal in setting of GI loss  versus lupus nephritis. -Recommend renal function at follow-up.  Polyarticular pain/SLE: Concern for lupus flareup.   -Resumed home Plaquenil at 200 mg twice daily 2/19. -Started prednisone 7.5 mg daily on 2/19 for a total of 7 days.  Hypokalemia: Resolved.  Overweight: BMI 26. -Outpatient follow-up with PCP.  Episodic syncope: Unclear etiology.  Lupus can contribute to this.  Troponin negative.  EKG with sinus bradycardia with occasional PVCs.  No AV block.  No event on telemetry.  -May consider outpatient referral to cardiology for event monitor if this recurs.  Discharge Instructions  Discharge Instructions    Call MD for:  difficulty breathing, headache or visual disturbances   Complete by:  As directed    Call MD for:  persistant dizziness or light-headedness   Complete by:  As directed    Call MD for:  persistant nausea and vomiting   Complete by:  As directed    Call MD for:  redness, tenderness, or signs of infection (pain, swelling, redness, odor or green/yellow discharge around incision site)   Complete by:  As directed    Call MD for:  severe uncontrolled pain   Complete by:  As directed    Call MD for:  temperature >100.4   Complete by:  As directed    Diet - low sodium heart healthy   Complete by:  As directed    Discharge instructions   Complete by:  As directed    It has been a pleasure taking care of you! You were admitted due to rectal bleeding likely from diverticulosis as noted on colonoscopy.  Your bleeding subsided.  At this point, we think it is safe to let you go home and follow-up  with your primary care doctor.  We also recommend you follow-up with your rheumatologist for your lupus.  Once you are discharged, your primary care physician will handle any further medical issues. Please note that NO REFILLS for any discharge medications will be authorized once you are discharged, as it is imperative that you return to your primary care physician (or  establish a relationship with a primary care physician if you do not have one) for your aftercare needs so that they can reassess your need for medications and monitor your lab values. Take care,   Increase activity slowly   Complete by:  As directed      Allergies as of 09/28/2018      Reactions   Prochlorperazine Other (See Comments)   Muscle stiffness      Medication List    TAKE these medications   amLODipine 10 MG tablet Commonly known as:  NORVASC Take 10 mg by mouth daily.   citalopram 10 MG tablet Commonly known as:  CELEXA Take 10 mg by mouth daily.   dimenhyDRINATE 50 MG tablet Commonly known as:  DRAMAMINE Take 50 mg by mouth daily as needed for nausea or dizziness.   hydroxychloroquine 200 MG tablet Commonly known as:  PLAQUENIL Take 1 tablet (200 mg total) by mouth 2 (two) times daily.   losartan 50 MG tablet Commonly known as:  COZAAR Take 50 mg by mouth daily.   predniSONE 2.5 MG tablet Commonly known as:  DELTASONE Take 3 tablets (7.5 mg total) by mouth daily with breakfast. Start taking on:  September 29, 2018        Consultations:  Gastroenterology  Procedures/Studies:  2D Echo: None obtained this admission.  Ct Abdomen Pelvis W Contrast  Result Date: 09/24/2018 CLINICAL DATA:  Abdominal distention, pain, rectal bleeding EXAM: CT ABDOMEN AND PELVIS WITH CONTRAST TECHNIQUE: Multidetector CT imaging of the abdomen and pelvis was performed using the standard protocol following bolus administration of intravenous contrast. CONTRAST:  ISOVUE-300 IOPAMIDOL (ISOVUE-300) INJECTION 61% COMPARISON:  None. FINDINGS: Lower chest: No acute abnormality. Hepatobiliary: No focal liver abnormality is seen. Status post cholecystectomy. No biliary dilatation. Pancreas: Unremarkable. No pancreatic ductal dilatation or surrounding inflammatory changes. Spleen: Normal in size without focal abnormality. Adrenals/Urinary Tract: Adrenal glands are unremarkable.  Kidneys are normal, without renal calculi, focal lesion, or hydronephrosis. Bladder is unremarkable. Stomach/Bowel: Stomach is within normal limits. Appendix appears normal. No evidence of bowel wall thickening, distention, or inflammatory changes. Descending and sigmoid diverticulosis. Vascular/Lymphatic: No significant vascular findings are present. No enlarged abdominal or pelvic lymph nodes. Reproductive: No mass or other abnormality. Status post hysterectomy. Other: No abdominal wall hernia or abnormality. No abdominopelvic ascites. Musculoskeletal: No acute or significant osseous findings. IMPRESSION: 1. No definite CT findings to explain abdominal pain and rectal bleeding. 2. Descending and sigmoid diverticulosis without intraluminal contrast or other findings to specifically localize rectal bleeding on this non tailored examination for evaluation of GI bleeding. Electronically Signed   By: Lauralyn Primes M.D.   On: 09/24/2018 16:10      Subjective: No major events overnight or this morning.  Denies nausea, vomiting, abdominal pain, diarrhea, hematochezia or melena.  Eating well for this morning.  Feels ready to go home.  Discharge Exam: Vitals:   09/27/18 2113 09/28/18 0514  BP: 109/74 129/83  Pulse: 81 (!) 57  Resp: 18 18  Temp: 98.3 F (36.8 C) 98.7 F (37.1 C)  SpO2: 95% 94%    GENERAL: Appears well. No acute  distress.  HEENT: MMM.  Vision and Hearing grossly intact.  NECK: Supple.  No JVD.  LUNGS:  No IWOB. Good air movement. CTAB.  HEART:  RRR. Heart sounds normal. ABD: Bowel sounds present. Soft. Non tender.  EXT:   no edema bilaterally.  SKIN: no apparent skin lesion.  NEURO: Awake, alert and oriented appropriately.  No gross deficit.  PSYCH: Calm. Normal affect.    The results of significant diagnostics from this hospitalization (including imaging, microbiology, ancillary and laboratory) are listed below for reference.     Microbiology: Recent Results (from the past  240 hour(s))  MRSA PCR Screening     Status: None   Collection Time: 09/25/18 12:38 AM  Result Value Ref Range Status   MRSA by PCR NEGATIVE NEGATIVE Final    Comment:        The GeneXpert MRSA Assay (FDA approved for NASAL specimens only), is one component of a comprehensive MRSA colonization surveillance program. It is not intended to diagnose MRSA infection nor to guide or monitor treatment for MRSA infections. Performed at [facility], [address], [place], [place] [address]      Labs: BNP (last 3 results) No results for input(s): BNP in the last 8760 hours. Basic Metabolic Panel: Recent Labs  Lab 09/24/18 1145 09/24/18 2101 09/25/18 0120 09/26/18 0245 09/27/18 0442 09/28/18 0439  NA 140  --  137 138 139 139  K 3.5  --  3.4* 3.0* 3.5 3.6  CL 108  --  109 107 105 109  CO2 26  --  22 23 26 24   GLUCOSE 106*  --  83 92 81 104*  BUN 17  --  15 12 21* 18  CREATININE 1.02*  --  0.82 0.81 1.38* 0.95  CALCIUM 8.4*  --  8.0* 8.6* 8.8* 8.5*  MG  --  2.1 2.0 1.9  --   --   PHOS  --   --  2.8  --   --   --    Liver Function Tests: Recent Labs  Lab 09/24/18 1145 09/25/18 0120  AST 20 18  ALT 16 14  ALKPHOS 85 75  BILITOT 0.7 0.6  PROT 7.3 6.4*  ALBUMIN 4.1 3.6   Recent Labs  Lab 09/24/18 1145  LIPASE 29   No results for input(s): AMMONIA in the last 168 hours. CBC: Recent Labs  Lab 09/25/18 0120 09/25/18 1009 09/26/18 0245 09/27/18 0442 09/28/18 0439  WBC 7.7 5.2 7.5 6.6 7.6  NEUTROABS  --   --  5.7  --   --   HGB 10.2* 10.6* 10.5* 10.8* 9.5*  HCT 32.3* 33.8* 33.0* 34.7* 31.4*  MCV 90.0 89.2 88.9 89.4 91.5  PLT 188 188 190 201 196   Cardiac Enzymes: Recent Labs  Lab 09/24/18 2101 09/25/18 0120 09/25/18 0641  TROPONINI <0.03 <0.03 <0.03   BNP: Invalid input(s): POCBNP CBG: Recent Labs  Lab 09/24/18 1232  GLUCAP 122*   D-Dimer No results for input(s): DDIMER in the last 72 hours. Hgb A1c No results for input(s):  HGBA1C in the last 72 hours. Lipid Profile No results for input(s): CHOL, HDL, LDLCALC, TRIG, CHOLHDL, LDLDIRECT in the last 72 hours. Thyroid function studies No results for input(s): TSH, T4TOTAL, T3FREE, THYROIDAB in the last 72 hours.  Invalid input(s): FREET3 Anemia work up No results for input(s): VITAMINB12, FOLATE, FERRITIN, TIBC, IRON, RETICCTPCT in the last 72 hours. Urinalysis    Component Value Date/Time   COLORURINE AMBER (A) 09/24/2018  1154   APPEARANCEUR HAZY (A) 09/24/2018 1154   LABSPEC 1.032 (H) 09/24/2018 1154   PHURINE 5.0 09/24/2018 1154   GLUCOSEU NEGATIVE 09/24/2018 1154   HGBUR NEGATIVE 09/24/2018 1154   BILIRUBINUR SMALL (A) 09/24/2018 1154   KETONESUR 5 (A) 09/24/2018 1154   PROTEINUR 30 (A) 09/24/2018 1154   NITRITE NEGATIVE 09/24/2018 1154   LEUKOCYTESUR NEGATIVE 09/24/2018 1154   Sepsis Labs Invalid input(s): PROCALCITONIN,  WBC,  LACTICIDVEN   Time coordinating discharge: 40  minutes  SIGNED:  Almon Hercules, MD  [facility] 09/28/2018, 12:55 PM Pager [phone]  If 7PM-7AM, please contact night-coverage [email or web address] Password [email or web address]

## 2018-09-28 NOTE — Progress Notes (Signed)
Patient's IVs removed.  Sites WNL.  AVS reviewed with patient.  Verbalized understanding of discharge instructions, physician follow-up, medications.  Patient reports belongings intact and possession at time of discharge.  Patient stable awaiting family for transport home.

## 2018-10-02 ENCOUNTER — Inpatient Hospital Stay (HOSPITAL_COMMUNITY)
Admission: EM | Admit: 2018-10-02 | Discharge: 2018-10-05 | DRG: 378 | Disposition: A | Discharge status: Home / Self Care | Payer: Self-pay | Attending: Internal Medicine | Admitting: Internal Medicine

## 2018-10-02 ENCOUNTER — Other Ambulatory Visit: Payer: Self-pay

## 2018-10-02 ENCOUNTER — Encounter (HOSPITAL_COMMUNITY): Payer: Self-pay | Admitting: Internal Medicine

## 2018-10-02 ENCOUNTER — Observation Stay (HOSPITAL_COMMUNITY): Payer: Self-pay

## 2018-10-02 DIAGNOSIS — Z9049 Acquired absence of other specified parts of digestive tract: Secondary | ICD-10-CM

## 2018-10-02 DIAGNOSIS — K922 Gastrointestinal hemorrhage, unspecified: Secondary | ICD-10-CM | POA: Diagnosis present

## 2018-10-02 DIAGNOSIS — M329 Systemic lupus erythematosus, unspecified: Secondary | ICD-10-CM | POA: Diagnosis present

## 2018-10-02 DIAGNOSIS — Z8042 Family history of malignant neoplasm of prostate: Secondary | ICD-10-CM

## 2018-10-02 DIAGNOSIS — N179 Acute kidney failure, unspecified: Secondary | ICD-10-CM

## 2018-10-02 DIAGNOSIS — Z800 Family history of malignant neoplasm of digestive organs: Secondary | ICD-10-CM

## 2018-10-02 DIAGNOSIS — Z8249 Family history of ischemic heart disease and other diseases of the circulatory system: Secondary | ICD-10-CM

## 2018-10-02 DIAGNOSIS — Z803 Family history of malignant neoplasm of breast: Secondary | ICD-10-CM

## 2018-10-02 DIAGNOSIS — K5731 Diverticulosis of large intestine without perforation or abscess with bleeding: Principal | ICD-10-CM | POA: Diagnosis present

## 2018-10-02 DIAGNOSIS — Z823 Family history of stroke: Secondary | ICD-10-CM

## 2018-10-02 DIAGNOSIS — Z888 Allergy status to other drugs, medicaments and biological substances status: Secondary | ICD-10-CM

## 2018-10-02 DIAGNOSIS — Z79899 Other long term (current) drug therapy: Secondary | ICD-10-CM

## 2018-10-02 DIAGNOSIS — D62 Acute posthemorrhagic anemia: Secondary | ICD-10-CM | POA: Diagnosis present

## 2018-10-02 DIAGNOSIS — Z833 Family history of diabetes mellitus: Secondary | ICD-10-CM

## 2018-10-02 DIAGNOSIS — K644 Residual hemorrhoidal skin tags: Secondary | ICD-10-CM | POA: Diagnosis present

## 2018-10-02 DIAGNOSIS — I10 Essential (primary) hypertension: Secondary | ICD-10-CM | POA: Diagnosis present

## 2018-10-02 HISTORY — DX: Acute kidney failure, unspecified: N17.9

## 2018-10-02 HISTORY — DX: Gastrointestinal hemorrhage, unspecified: K92.2

## 2018-10-02 LAB — HEMOGLOBIN AND HEMATOCRIT, BLOOD
HCT: 32.9 % — ABNORMAL LOW (ref 36.0–46.0)
Hemoglobin: 10.2 g/dL — ABNORMAL LOW (ref 12.0–15.0)

## 2018-10-02 LAB — COMPREHENSIVE METABOLIC PANEL
ALT: 14 U/L (ref 0–44)
AST: 18 U/L (ref 15–41)
Albumin: 3.7 g/dL (ref 3.5–5.0)
Alkaline Phosphatase: 67 U/L (ref 38–126)
Anion gap: 7 (ref 5–15)
BUN: 19 mg/dL (ref 6–20)
CO2: 24 mmol/L (ref 22–32)
CREATININE: 1.02 mg/dL — AB (ref 0.44–1.00)
Calcium: 8.3 mg/dL — ABNORMAL LOW (ref 8.9–10.3)
Chloride: 108 mmol/L (ref 98–111)
GFR calc Af Amer: >60 mL/min (ref >60)
GFR calc non Af Amer: >60 mL/min (ref >60)
Glucose, Bld: 121 mg/dL — ABNORMAL HIGH (ref 70–99)
Potassium: 3.7 mmol/L (ref 3.5–5.1)
Sodium: 139 mmol/L (ref 135–145)
Total Bilirubin: 0.3 mg/dL (ref 0.3–1.2)
Total Protein: 6.4 g/dL — ABNORMAL LOW (ref 6.5–8.1)

## 2018-10-02 LAB — CBC
HCT: 22.6 % — ABNORMAL LOW (ref 36.0–46.0)
Hemoglobin: 7.2 g/dL — ABNORMAL LOW (ref 12.0–15.0)
MCH: 28.9 pg (ref 26.0–34.0)
MCHC: 31.9 g/dL (ref 30.0–36.0)
MCV: 90.8 fL (ref 80.0–100.0)
NRBC: 0 % (ref 0.0–0.2)
Platelets: 182 10*3/uL (ref 150–400)
RBC: 2.49 MIL/uL — ABNORMAL LOW (ref 3.87–5.11)
RDW: 12.6 % (ref 11.5–15.5)
WBC: 6.5 10*3/uL (ref 4.0–10.5)

## 2018-10-02 LAB — CBC WITH DIFFERENTIAL/PLATELET
Abs Immature Granulocytes: 0.04 10*3/uL (ref 0.00–0.07)
Basophils Absolute: 0 10*3/uL (ref 0.0–0.1)
Basophils Relative: 0 %
Eosinophils Absolute: 0.1 10*3/uL (ref 0.0–0.5)
Eosinophils Relative: 2 %
HCT: 25.7 % — ABNORMAL LOW (ref 36.0–46.0)
Hemoglobin: 8.3 g/dL — ABNORMAL LOW (ref 12.0–15.0)
Immature Granulocytes: 1 %
Lymphocytes Relative: 20 %
Lymphs Abs: 1.7 10*3/uL (ref 0.7–4.0)
MCH: 29 pg (ref 26.0–34.0)
MCHC: 32.3 g/dL (ref 30.0–36.0)
MCV: 89.9 fL (ref 80.0–100.0)
Monocytes Absolute: 0.4 10*3/uL (ref 0.1–1.0)
Monocytes Relative: 5 %
Neutro Abs: 6.2 10*3/uL (ref 1.7–7.7)
Neutrophils Relative %: 72 %
Platelets: 208 10*3/uL (ref 150–400)
RBC: 2.86 MIL/uL — AB (ref 3.87–5.11)
RDW: 12.6 % (ref 11.5–15.5)
WBC: 8.6 10*3/uL (ref 4.0–10.5)
nRBC: 0 % (ref 0.0–0.2)

## 2018-10-02 LAB — BASIC METABOLIC PANEL
Anion gap: 5 (ref 5–15)
BUN: 20 mg/dL (ref 6–20)
CO2: 25 mmol/L (ref 22–32)
Calcium: 7.9 mg/dL — ABNORMAL LOW (ref 8.9–10.3)
Chloride: 108 mmol/L (ref 98–111)
Creatinine, Ser: 0.99 mg/dL (ref 0.44–1.00)
GFR calc Af Amer: >60 mL/min (ref >60)
GFR calc non Af Amer: >60 mL/min (ref >60)
Glucose, Bld: 107 mg/dL — ABNORMAL HIGH (ref 70–99)
POTASSIUM: 3.8 mmol/L (ref 3.5–5.1)
Sodium: 138 mmol/L (ref 135–145)

## 2018-10-02 LAB — PREPARE RBC (CROSSMATCH)

## 2018-10-02 LAB — PROTIME-INR
INR: 1 (ref 0.8–1.2)
PROTHROMBIN TIME: 13.3 s (ref 11.4–15.2)

## 2018-10-02 LAB — I-STAT BETA HCG BLOOD, ED (MC, WL, AP ONLY): I-stat hCG, quantitative: <5 m[IU]/mL (ref <5)

## 2018-10-02 MED ORDER — SODIUM CHLORIDE 0.9 % IV SOLN
8.0000 mg/h | INTRAVENOUS | Status: DC
  Administered 2018-10-02 – 2018-10-03 (×3): 8 mg/h via INTRAVENOUS
  Filled 2018-10-02 (×5): qty 80

## 2018-10-02 MED ORDER — OXYCODONE HCL 5 MG PO TABS
5.0000 mg | ORAL_TABLET | Freq: Four times a day (QID) | ORAL | Status: DC | PRN
  Administered 2018-10-02 – 2018-10-05 (×8): 5 mg via ORAL
  Filled 2018-10-02 (×9): qty 1

## 2018-10-02 MED ORDER — ACETAMINOPHEN 650 MG RE SUPP: 650.0000 mg | Freq: Four times a day (QID) | RECTAL | Status: DC | PRN

## 2018-10-02 MED ORDER — ACETAMINOPHEN 325 MG PO TABS
650.0000 mg | ORAL_TABLET | Freq: Four times a day (QID) | ORAL | Status: DC | PRN
  Administered 2018-10-02 – 2018-10-03 (×2): 650 mg via ORAL
  Filled 2018-10-02 (×2): qty 2

## 2018-10-02 MED ORDER — SODIUM CHLORIDE 0.9 % IV SOLN
80.0000 mg | Freq: Once | INTRAVENOUS | Status: AC
  Administered 2018-10-02: 80 mg via INTRAVENOUS
  Filled 2018-10-02: qty 80

## 2018-10-02 MED ORDER — CITALOPRAM HYDROBROMIDE 20 MG PO TABS
10.0000 mg | ORAL_TABLET | Freq: Every day | ORAL | Status: DC
  Administered 2018-10-02 – 2018-10-05 (×4): 10 mg via ORAL
  Filled 2018-10-02 (×4): qty 1

## 2018-10-02 MED ORDER — ONDANSETRON HCL 4 MG PO TABS
4.0000 mg | ORAL_TABLET | Freq: Four times a day (QID) | ORAL | Status: DC | PRN
  Administered 2018-10-04: 4 mg via ORAL
  Filled 2018-10-02: qty 1

## 2018-10-02 MED ORDER — PROMETHAZINE HCL 25 MG/ML IJ SOLN
12.5000 mg | Freq: Once | INTRAMUSCULAR | Status: AC
  Administered 2018-10-02: 12.5 mg via INTRAVENOUS
  Filled 2018-10-02: qty 1

## 2018-10-02 MED ORDER — HYDRALAZINE HCL 20 MG/ML IJ SOLN: 5.0000 mg | INTRAMUSCULAR | Status: DC | PRN

## 2018-10-02 MED ORDER — SODIUM CHLORIDE 0.9% IV SOLUTION
Freq: Once | INTRAVENOUS | Status: AC
  Administered 2018-10-02: 09:00:00 via INTRAVENOUS

## 2018-10-02 MED ORDER — HYDROXYCHLOROQUINE SULFATE 200 MG PO TABS
200.0000 mg | ORAL_TABLET | Freq: Two times a day (BID) | ORAL | Status: DC
  Administered 2018-10-02 – 2018-10-05 (×7): 200 mg via ORAL
  Filled 2018-10-02 (×7): qty 1

## 2018-10-02 MED ORDER — TIZANIDINE HCL 4 MG PO TABS
2.0000 mg | ORAL_TABLET | Freq: Three times a day (TID) | ORAL | Status: DC | PRN
  Administered 2018-10-02 – 2018-10-03 (×2): 2 mg via ORAL
  Filled 2018-10-02 (×2): qty 1

## 2018-10-02 MED ORDER — HYDROCORTISONE ACETATE 25 MG RE SUPP
25.0000 mg | Freq: Two times a day (BID) | RECTAL | Status: DC
  Administered 2018-10-02 – 2018-10-03 (×2): 25 mg via RECTAL
  Filled 2018-10-02 (×5): qty 1

## 2018-10-02 MED ORDER — SODIUM CHLORIDE 0.9 % IV SOLN
INTRAVENOUS | Status: AC
  Administered 2018-10-02: 03:00:00 via INTRAVENOUS

## 2018-10-02 MED ORDER — ONDANSETRON HCL 4 MG/2ML IJ SOLN
4.0000 mg | Freq: Four times a day (QID) | INTRAMUSCULAR | Status: DC | PRN
  Administered 2018-10-02 (×2): 4 mg via INTRAVENOUS
  Filled 2018-10-02 (×4): qty 2

## 2018-10-02 MED ORDER — TECHNETIUM TC 99M-LABELED RED BLOOD CELLS IV KIT
23.2000 | PACK | Freq: Once | INTRAVENOUS | Status: AC | PRN
  Administered 2018-10-02: 23.2 via INTRAVENOUS

## 2018-10-02 NOTE — Progress Notes (Signed)
PROGRESS NOTE  Crystal Winters ZOX:[id number] DOB: [birth year] DOA: 10/02/2018 PCP: System, Pcp Not In   LOS: 0 days   Brief Narrative / Interim history: 48 year old female with history of hypertension, lupus and rectal bleed presented with hematochezia, fatigue and presyncope. Recently hospitalized for rectal bleed.  CT abdomen and colonoscopy revealed diverticulosis which was thought to be the source of her bleeding.   In ED, had a large bloody bowel movement.  Hemoglobin dropped by 1 g (greater than two-point from discharge).  Hemodynamically stable.  Admitted for further evaluation.  Next morning, transfuse 2 units of blood.  Gastroenterology consulted.  Bleeding scan ordered and did not show evidence of active gastrointestinal bleeding.   Subjective: Noted frank red blood about 11 PM last night.  Reports feeling lightheaded and drained.  Also felt short of breath but no chest pain or palpitation.  Reports feeling better this morning.  She denies fever.  Assessment & Plan: Principal Problem:   Acute GI bleeding Active Problems:   Essential hypertension   Lupus (HCC)   ARF (acute renal failure) (HCC)  Hematochezia/symptomatic anemia: recent colonoscopy on 09/27/2018 showed nonbleeding diverticulosis.  Bleeding scan negative.  Dynamically stable. -Gastroenterology on board-considering endoscopy -Transfusing 2 units -Monitor H&H  History of lupus: On Plaquenil.  Treated with low-dose steroid for mild exacerbation last admission.  Stable. -Continue home Plaquenil  Hypertension: Normotensive.   Scheduled Meds: . citalopram  10 mg Oral Daily  . hydrocortisone  25 mg Rectal BID  . hydroxychloroquine  200 mg Oral BID   Continuous Infusions: . sodium chloride Stopped (10/02/18 0833)  . pantoprozole (PROTONIX) infusion 8 mg/hr (10/02/18 0223)   PRN Meds:.acetaminophen **OR** acetaminophen, hydrALAZINE, ondansetron **OR** ondansetron (ZOFRAN) IV, oxyCODONE, tiZANidine  DVT  prophylaxis: SCD in the setting of GI bleed Code Status: Full code Family Communication: None at bedside Disposition Plan: Remains inpatient.  Consultants:  Gastroenterology  Procedures:   Colonoscopy on 09/27/2018 showed nonbleeding descending and sigmoid diverticulosis.  Antimicrobials:  None  Objective: Vitals:   10/02/18 1104 10/02/18 1233 10/02/18 1349 10/02/18 1411  BP: (!) 128/94 (!) 140/94 (!) 156/98 (!) 152/101  Pulse: 74 65 64 67  Resp: Temp:    98.4 F (36.9 C)  TempSrc:    Oral  SpO2: 100% 100% 100% 99%    Intake/Output Summary (Last 24 hours) at 10/02/2018 1412 Last data filed at 10/02/2018 1406 Gross per 24 hour  Intake 1315.23 ml  Output -  Net 1315.23 ml   There were no vitals filed for this visit.  Examination:  GENERAL: Appears well. No acute distress.  EYES - vision grossly intact. Sclera anicteric.  Conjunctival pale. NOSE- no gross deformity or drainage MOUTH - no oral lesions noted THROAT- no swelling or erythema LUNGS:  No IWOB. Good air movement. CTAB.  HEART: RRR. Heart sounds normal. ABD: Bowel sounds present. Soft.  Mild tenderness over RLQ. MSK/EXT: Moves extremities. No obvious deformity. SKIN: no apparent skin lesion.  Pale skin. NEURO: Awake, alert and oriented appropriately.  No gross deficit.  PSYCH: Calm. Normal affect.    Data Reviewed: I have independently reviewed following labs and imaging studies  CBC: Recent Labs  Lab 09/26/18 0245 09/27/18 0442 09/28/18 0439 10/02/18 0114 10/02/18 0515  WBC 7.5 6.6 7.6 8.6 6.5  NEUTROABS 5.7  --   --  6.2  --   HGB 10.5* 10.8* 9.5* 8.3* 7.2*  HCT 33.0* 34.7* 31.4* 25.7* 22.6*  MCV 88.9 89.4 91.5 89.9 90.8  PLT 190 201 196 208 182   Basic Metabolic Panel: Recent Labs  Lab 09/26/18 0245 09/27/18 0442 09/28/18 0439 10/02/18 0114 10/02/18 0515  NA 138 139 139 139 138  K 3.0* 3.5 3.6 3.7 3.8  CL 107 105 109 108 108  CO2 23 26 24 24 25   GLUCOSE 92 81 104* 121*  107*  BUN 12 21* 18 19 20   CREATININE 0.81 1.38* 0.95 1.02* 0.99  CALCIUM 8.6* 8.8* 8.5* 8.3* 7.9*  MG 1.9  --   --   --   --    GFR: Estimated Creatinine Clearance: 87 mL/min (by C-G formula based on SCr of 0.99 mg/dL). Liver Function Tests: Recent Labs  Lab 10/02/18 0114  AST 18  ALT 14  ALKPHOS 67  BILITOT 0.3  PROT 6.4*  ALBUMIN 3.7   No results for input(s): LIPASE, AMYLASE in the last 168 hours. No results for input(s): AMMONIA in the last 168 hours. Coagulation Profile: Recent Labs  Lab 10/02/18 0114  INR 1.0   Cardiac Enzymes: No results for input(s): CKTOTAL, CKMB, CKMBINDEX, TROPONINI in the last 168 hours. BNP (last 3 results) No results for input(s): PROBNP in the last 8760 hours. HbA1C: No results for input(s): HGBA1C in the last 72 hours. CBG: No results for input(s): GLUCAP in the last 168 hours. Lipid Profile: No results for input(s): CHOL, HDL, LDLCALC, TRIG, CHOLHDL, LDLDIRECT in the last 72 hours. Thyroid Function Tests: No results for input(s): TSH, T4TOTAL, FREET4, T3FREE, THYROIDAB in the last 72 hours. Anemia Panel: No results for input(s): VITAMINB12, FOLATE, FERRITIN, TIBC, IRON, RETICCTPCT in the last 72 hours. Urine analysis:    Component Value Date/Time   COLORURINE AMBER (A) 09/24/2018 1154   APPEARANCEUR HAZY (A) 09/24/2018 1154   LABSPEC 1.032 (H) 09/24/2018 1154   PHURINE 5.0 09/24/2018 1154   GLUCOSEU NEGATIVE 09/24/2018 1154   HGBUR NEGATIVE 09/24/2018 1154   BILIRUBINUR SMALL (A) 09/24/2018 1154   KETONESUR 5 (A) 09/24/2018 1154   PROTEINUR 30 (A) 09/24/2018 1154   NITRITE NEGATIVE 09/24/2018 1154   LEUKOCYTESUR NEGATIVE 09/24/2018 1154   Sepsis Labs: Invalid input(s): PROCALCITONIN, LACTICIDVEN  Recent Results (from the past 240 hour(s))  MRSA PCR Screening     Status: None   Collection Time: 09/25/18 12:38 AM  Result Value Ref Range Status   MRSA by PCR NEGATIVE NEGATIVE Final    Comment:        The GeneXpert MRSA  Assay (FDA approved for NASAL specimens only), is one component of a comprehensive MRSA colonization surveillance program. It is not intended to diagnose MRSA infection nor to guide or monitor treatment for MRSA infections. Performed at [facility], [address] [place], [place] [address]       Radiology Studies: Nm Gi Blood Loss  Result Date: 10/02/2018 CLINICAL DATA:  RIGHT red blood per rectum.  GI bleeding. EXAM: NUCLEAR MEDICINE GASTROINTESTINAL BLEEDING SCAN TECHNIQUE: Sequential abdominal images were obtained following intravenous administration of Tc-49m labeled red blood cells. RADIOPHARMACEUTICALS:  23.2 mCi Tc-1m pertechnetate in-vitro labeled red cells. COMPARISON:  CT 09/24/2018 FINDINGS: No accumulation of tagged red blood cells within the abdomen pelvis to localize active gastrointestinal bleeding. Dynamic imaging over 2 hours. Physiologic activity noted in the blood pool, solid organs and GU tract. IMPRESSION: No scintigraphic evidence of active gastrointestinal bleeding Electronically Signed   By: Genevive Bi M.D.   On: 10/02/2018 14:07   Jenee Spaugh T. [facility] Pager [phone]  If 7PM-7AM, please contact  night-coverage [email or web address] Password [email or web address] 10/02/2018, 2:12 PM

## 2018-10-02 NOTE — H&P (Signed)
History and Physical    Crystal Laurenceudrena Bin WUJ:[id number] DOB: 08/28/1970 DOA: 10/02/2018  PCP: System, Pcp Not In  Patient coming from: Home.  Chief Complaint: Rectal bleeding.  HPI: Crystal Winters is a 48 y.o. female with history of hypertension and lupus recently admitted for rectal bleeding and discharged on August 28, 2018 and at that time CAT scan and colonoscopy had shown diverticulosis.  Patient states she was doing fine until last evening when patient had 3 episodes of frank rectal bleeding felt little bit dizzy did not lose consciousness.  Had mild abdominal discomfort.  Felt nauseated.  Patient does not take any antiplatelet agents or NSAIDs.  ED Course: In the ER patient had another large bowel movement which was frankly bloody.  Hemoglobin had dropped by 1 g.  Patient admitted for acute GI bleed.  Review of Systems: As per HPI, rest all negative.   Past Medical History:  Diagnosis Date  . Hypertension   . Lupus [facility](HCC)     Past Surgical History:  Procedure Laterality Date  . ABDOMINAL HYSTERECTOMY    . BREAST REDUCTION SURGERY    . CHOLECYSTECTOMY    . COLONOSCOPY WITH PROPOFOL N/A 09/27/2018   Procedure: COLONOSCOPY WITH PROPOFOL;  Surgeon: Graylin ShiverGanem, Salem F, MD;  Location: [facility];  Service: Endoscopy;  Laterality: N/A;  . TOE SURGERY Right   . TUBAL LIGATION       reports that she has never smoked. She has never used smokeless tobacco. She reports previous alcohol use. She reports that she does not use drugs.  Allergies  Allergen Reactions  . Prochlorperazine Other (See Comments)    Muscle stiffness    Family History  Problem Relation Age of Onset  . Rectal cancer Other   . Hypertension Other   . Diabetes Other   . Breast cancer Other   . Colon cancer Other   . Prostate cancer Other   . Stroke Other   . CAD Other   . Coronary artery disease Other     Prior to Admission medications   Medication Sig Start Date End Date Taking? Authorizing Provider    amLODipine (NORVASC) 10 MG tablet Take 10 mg by mouth daily.   Yes [provider]  citalopram (CELEXA) 10 MG tablet Take 10 mg by mouth daily.   Yes [provider]  dimenhyDRINATE (DRAMAMINE) 50 MG tablet Take 50 mg by mouth daily as needed for nausea or dizziness.   Yes [provider]  hydroxychloroquine (PLAQUENIL) 200 MG tablet Take 1 tablet (200 mg total) by mouth 2 (two) times daily. 09/28/18  Yes Almon HerculesGonfa, Taye T, MD  losartan (COZAAR) 50 MG tablet Take 50 mg by mouth daily.   Yes [provider]  Melatonin 3 MG CAPS Take 3 mg by mouth at bedtime.   Yes [provider]  predniSONE (DELTASONE) 2.5 MG tablet Take 3 tablets (7.5 mg total) by mouth daily with breakfast. 09/29/18  Yes Gonfa, Taye T, MD  tiZANidine (ZANAFLEX) 2 MG tablet Take 2 mg by mouth every 8 (eight) hours as needed for muscle spasms.   Yes [provider]    Physical Exam: Vitals:   10/02/18 0103 10/02/18 0130 10/02/18 0200  BP: (!) 136/102 (!) 125/94 (!) 133/102  Pulse: 78 72 88  Resp: 14 14 20   Temp: 98.5 F (36.9 C)    TempSrc: Oral    SpO2: 99% 98% 98%      Constitutional: Moderately built and nourished. Vitals:   10/02/18  0103 10/02/18 0130 10/02/18 0200  BP: (!) 136/102 (!) 125/94 (!) 133/102  Pulse: 78 72 88  Resp: 14 14 20   Temp: 98.5 F (36.9 C)    TempSrc: Oral    SpO2: 99% 98% 98%   Eyes: Anicteric no pallor. ENMT: No discharge from the ears eyes nose or mouth. Neck: No mass felt.  No neck rigidity. Respiratory: No rhonchi or crepitations. Cardiovascular: S1-S2 heard. Abdomen: Soft nontender bowel sounds present. Musculoskeletal: No edema.  No joint effusion. Skin: No rash. Neurologic: Alert awake oriented to time place and person.  Moves all extremities. Psychiatric: Appears normal.  Normal affect.   Labs on Admission: I have personally reviewed following labs and imaging studies  CBC: Recent Labs  Lab 09/25/18 1009  09/26/18 0245 09/27/18 0442 09/28/18 0439 10/02/18 0114  WBC 5.2 7.5 6.6 7.6 8.6  NEUTROABS  --  5.7  --   --  6.2  HGB 10.6* 10.5* 10.8* 9.5* 8.3*  HCT 33.8* 33.0* 34.7* 31.4* 25.7*  MCV 89.2 88.9 89.4 91.5 89.9  PLT 188 190 201 196 208   Basic Metabolic Panel: Recent Labs  Lab 09/26/18 0245 09/27/18 0442 09/28/18 0439 10/02/18 0114  NA 138 139 139 139  K 3.0* 3.5 3.6 3.7  CL 107 105 109 108  CO2 23 26 24 24   GLUCOSE 92 81 104* 121*  BUN 12 21* 18 19  CREATININE 0.81 1.38* 0.95 1.02*  CALCIUM 8.6* 8.8* 8.5* 8.3*  MG 1.9  --   --   --    GFR: Estimated Creatinine Clearance: 84.4 mL/min (A) (by C-G formula based on SCr of 1.02 mg/dL (H)). Liver Function Tests: Recent Labs  Lab 10/02/18 0114  AST 18  ALT 14  ALKPHOS 67  BILITOT 0.3  PROT 6.4*  ALBUMIN 3.7   No results for input(s): LIPASE, AMYLASE in the last 168 hours. No results for input(s): AMMONIA in the last 168 hours. Coagulation Profile: Recent Labs  Lab 10/02/18 0114  INR 1.0   Cardiac Enzymes: Recent Labs  Lab 09/25/18 0641  TROPONINI <0.03   BNP (last 3 results) No results for input(s): PROBNP in the last 8760 hours. HbA1C: No results for input(s): HGBA1C in the last 72 hours. CBG: No results for input(s): GLUCAP in the last 168 hours. Lipid Profile: No results for input(s): CHOL, HDL, LDLCALC, TRIG, CHOLHDL, LDLDIRECT in the last 72 hours. Thyroid Function Tests: No results for input(s): TSH, T4TOTAL, FREET4, T3FREE, THYROIDAB in the last 72 hours. Anemia Panel: No results for input(s): VITAMINB12, FOLATE, FERRITIN, TIBC, IRON, RETICCTPCT in the last 72 hours. Urine analysis:    Component Value Date/Time   COLORURINE AMBER (A) 09/24/2018 1154   APPEARANCEUR HAZY (A) 09/24/2018 1154   LABSPEC 1.032 (H) 09/24/2018 1154   PHURINE 5.0 09/24/2018 1154   GLUCOSEU NEGATIVE 09/24/2018 1154   HGBUR NEGATIVE 09/24/2018 1154   BILIRUBINUR SMALL (A) 09/24/2018 1154   KETONESUR 5 (A)  09/24/2018 1154   PROTEINUR 30 (A) 09/24/2018 1154   NITRITE NEGATIVE 09/24/2018 1154   LEUKOCYTESUR NEGATIVE 09/24/2018 1154   Sepsis Labs: @LABRCNTIP (procalcitonin:4,lacticidven:4) ) Recent Results (from the past 240 hour(s))  MRSA PCR Screening     Status: None   Collection Time: 09/25/18 12:38 AM  Result Value Ref Range Status   MRSA by PCR NEGATIVE NEGATIVE Final    Comment:        The GeneXpert MRSA Assay (FDA approved for NASAL specimens only), is one component of a comprehensive MRSA colonization  surveillance program. It is not intended to diagnose MRSA infection nor to guide or monitor treatment for MRSA infections. Performed at [facility], [address], [place], [place] [address]      Radiological Exams on Admission: No results found.   Assessment/Plan Principal Problem:   Acute GI bleeding Active Problems:   Essential hypertension   Lupus (HCC)   ARF (acute renal failure) (HCC)    1. Acute GI bleed likely diverticular in origin -has had recent colonoscopy which showed diverticulosis.  Patient was last at least for 5 hours has not had any further bleeding.  We will keep patient n.p.o. consult Dr. Evette Cristal gastroenterologist in the morning.  ER physician started patient on Protonix. 2. History of lupus on Plaquenil.  Has had recent exacerbation for which patient was given prednisone. 3. Hypertension -this patient felt lately dizzy after the bleeding will hold antihypertensive and keep patient on PRN IV hydralazine. 4. Acute blood loss anemia follow CBC.   DVT prophylaxis: SCDs. Code Status: Full code. Family Communication: Discussed with patient. Disposition Plan: Home. Consults called: None. Admission status: Observation.   Eduard Clos MD [facility] Pager [phone].  If 7PM-7AM, please contact night-coverage [email or web address] Password [facility]  10/02/2018, 2:37 AM

## 2018-10-02 NOTE — ED Notes (Signed)
IR RN to monitor transfusion while patient is in testing

## 2018-10-02 NOTE — ED Notes (Addendum)
Hospitalist and RN at bedside. 

## 2018-10-02 NOTE — Consult Note (Signed)
Referring Provider: [facility] Primary Care Physician:  System, Pcp Not In Primary Gastroenterologist: Unassigned  Reason for Consultation: Rectal bleeding  HPI: Crystal Winters is a 48 y.o. female with past medical history of lupus and hypertension who was recently discharged from the hospital after being treated for rectal bleeding presented back to the ED with chief complaint of rectal bleeding.  She was initially admitted to the hospital on September 24, 2018 for lower GI bleed.  Underwent colonoscopy which was done by Dr. Evette Cristal on September 27, 2018 which showed diverticulosis otherwise normal.  No evidence of active bleeding.  She was discharged on September 28, 2018.  Patient seen and examined at bedside in the emergency room.  According to patient, after discharge she had some abdominal distention and abdominal cramps.  Last night she started having rectal bleeding.  Had some dizziness and weakness.  C/O  nausea but denied any vomiting.    Past Medical History:  Diagnosis Date  . Hypertension   . Lupus [facility])     Past Surgical History:  Procedure Laterality Date  . ABDOMINAL HYSTERECTOMY    . BREAST REDUCTION SURGERY    . CHOLECYSTECTOMY    . COLONOSCOPY WITH PROPOFOL N/A 09/27/2018   Procedure: COLONOSCOPY WITH PROPOFOL;  Surgeon: Graylin Shiver, MD;  Location: [facility];  Service: Endoscopy;  Laterality: N/A;  . TOE SURGERY Right   . TUBAL LIGATION      Prior to Admission medications   Medication Sig Start Date End Date Taking? Authorizing Provider  amLODipine (NORVASC) 10 MG tablet Take 10 mg by mouth daily.   Yes [provider]  citalopram (CELEXA) 10 MG tablet Take 10 mg by mouth daily.   Yes [provider]  dimenhyDRINATE (DRAMAMINE) 50 MG tablet Take 50 mg by mouth daily as needed for nausea or dizziness.   Yes [provider]  hydroxychloroquine (PLAQUENIL) 200 MG tablet Take 1 tablet (200 mg total) by mouth 2 (two) times daily. 09/28/18  Yes  Almon Hercules, MD  losartan (COZAAR) 50 MG tablet Take 50 mg by mouth daily.   Yes [provider]  Melatonin 3 MG CAPS Take 3 mg by mouth at bedtime.   Yes [provider]  predniSONE (DELTASONE) 2.5 MG tablet Take 3 tablets (7.5 mg total) by mouth daily with breakfast. 09/29/18  Yes Gonfa, Taye T, MD  tiZANidine (ZANAFLEX) 2 MG tablet Take 2 mg by mouth every 8 (eight) hours as needed for muscle spasms.   Yes [provider]    Scheduled Meds: . citalopram  10 mg Oral Daily  . hydroxychloroquine  200 mg Oral BID   Continuous Infusions: . sodium chloride Stopped (10/02/18 0833)  . pantoprozole (PROTONIX) infusion 8 mg/hr (10/02/18 0223)   PRN Meds:.acetaminophen **OR** acetaminophen, hydrALAZINE, ondansetron **OR** ondansetron (ZOFRAN) IV, tiZANidine  Allergies as of 10/02/2018 - Review Complete 10/02/2018  Allergen Reaction Noted  . Prochlorperazine Other (See Comments) 08/26/2014    Family History  Problem Relation Age of Onset  . Rectal cancer Other   . Hypertension Other   . Diabetes Other   . Breast cancer Other   . Colon cancer Other   . Prostate cancer Other   . Stroke Other   . CAD Other   . Coronary artery disease Other     Social History   Socioeconomic History  . Marital status: Divorced    Spouse name: Not on file  . Number of children: Not on file  . Years  of education: Not on file  . Highest education level: Not on file  Occupational History  . Not on file  Social Needs  . Financial resource strain: Not on file  . Food insecurity:    Worry: Not on file    Inability: Not on file  . Transportation needs:    Medical: Not on file    Non-medical: Not on file  Tobacco Use  . Smoking status: Never Smoker  . Smokeless tobacco: Never Used  Substance and Sexual Activity  . Alcohol use: Not Currently  . Drug use: Never  . Sexual activity: Not on file  Lifestyle  . Physical activity:    Days per week: Not on file    Minutes  per session: Not on file  . Stress: Not on file  Relationships  . Social connections:    Talks on phone: Not on file    Gets together: Not on file    Attends religious service: Not on file    Active member of club or organization: Not on file    Attends meetings of clubs or organizations: Not on file    Relationship status: Not on file  . Intimate partner violence:    Fear of current or ex partner: Not on file    Emotionally abused: Not on file    Physically abused: Not on file    Forced sexual activity: Not on file  Other Topics Concern  . Not on file  Social History Narrative  . Not on file    Review of Systems: All negative except as stated above in HPI.  Physical Exam: Vital signs: Vitals:   10/02/18 0800 10/02/18 0849  BP: 130/89   Pulse: 63 68  Resp: 13 14  Temp:  98.4 F (36.9 C)  SpO2: 97%      General:   Alert,  Well-developed, well-nourished, pleasant and cooperative in NAD Lungs:  Clear throughout to auscultation.   No wheezes, crackles, or rhonchi. No acute distress. Heart:  Regular rate and rhythm; no murmurs, clicks, rubs,  or gallops. Abdomen: Soft, nontender, nondistended, bowel sounds present.  No peritoneal signs LE : No edema. Neuro.  Alert/oriented x3 Psych.  Mood and affect normal Rectal:  Deferred  GI:  Lab Results: Recent Labs    10/02/18 0114 10/02/18 0515  WBC 8.6 6.5  HGB 8.3* 7.2*  HCT 25.7* 22.6*  PLT 208 182   BMET Recent Labs    10/02/18 0114 10/02/18 0515  NA 139 138  K 3.7 3.8  CL 108 108  CO2 24 25  GLUCOSE 121* 107*  BUN 19 20  CREATININE 1.02* 0.99  CALCIUM 8.3* 7.9*   LFT Recent Labs    10/02/18 0114  PROT 6.4*  ALBUMIN 3.7  AST 18  ALT 14  ALKPHOS 67  BILITOT 0.3   PT/INR Recent Labs    10/02/18 0114  LABPROT 13.3  INR 1.0     Studies/Results: No results found.  Impression/Plan: - Rectal bleeding.  Most likely hemorrhoidal versus diverticular in nature.  Colonoscopy September 27, 2018  showed diverticulosis otherwise no evidence of active bleeding. - Acute blood loss anemia.  Recommendations ------------------------- -GI bleeding scan. -Anusol HC suppositories. -Transfuse as needed.  Monitor H&H. -If ongoing bleeding with negative bleeding scan, consider flexible sigmoidoscopy. -Okay to have clear liquid diet after bleeding scan. -GI will follow    LOS: 0 days   Kathi Der  MD, FACP 10/02/2018, 8:54 AM  Contact #  [phone]

## 2018-10-02 NOTE — ED Provider Notes (Signed)
[facility]-EMERGENCY DEPT Provider Note   CSN: [id number] Arrival date & time: 10/02/18  0057    History   Chief Complaint Chief Complaint  Patient presents with  . GI Bleeding    HPI Crystal Winters is a 48 y.o. female.     Patient presents to the emergency department with a chief complaint of GI bleed.  She was admitted last week for the same after having experienced significant hematochezia.  She had a colonoscopy performed which showed diverticulosis, which was thought to be the source of the bleeding.  The bleeding subsided.  She was ultimately released.  Patient states that today her stomach was cramping again, and she had a large amount of bloody diarrhea.  She states that she feels fatigued.  States that this is exactly what happened about a week ago.  She is not anticoagulated.  She denies any other associated symptoms.  Of note, during her last hospitalization he HGB dropped by 2 points.  The history is provided by the patient. No language interpreter was used.    Past Medical History:  Diagnosis Date  . Hypertension   . Lupus [facility])     Patient Active Problem List   Diagnosis Date Noted  . Lower GI bleed 09/24/2018  . Essential hypertension 09/24/2018  . Syncope 09/24/2018  . Lupus (HCC) 09/24/2018  . Hypokalemia 09/24/2018    Past Surgical History:  Procedure Laterality Date  . ABDOMINAL HYSTERECTOMY    . BREAST REDUCTION SURGERY    . CHOLECYSTECTOMY    . COLONOSCOPY WITH PROPOFOL N/A 09/27/2018   Procedure: COLONOSCOPY WITH PROPOFOL;  Surgeon: Graylin Shiver, MD;  Location: [facility];  Service: Endoscopy;  Laterality: N/A;  . TOE SURGERY Right   . TUBAL LIGATION       OB History   No obstetric history on file.      Home Medications    Prior to Admission medications   Medication Sig Start Date End Date Taking? Authorizing Provider  amLODipine (NORVASC) 10 MG tablet Take 10 mg by mouth daily.    [provider]    citalopram (CELEXA) 10 MG tablet Take 10 mg by mouth daily.    [provider]  dimenhyDRINATE (DRAMAMINE) 50 MG tablet Take 50 mg by mouth daily as needed for nausea or dizziness.    [provider]  hydroxychloroquine (PLAQUENIL) 200 MG tablet Take 1 tablet (200 mg total) by mouth 2 (two) times daily. 09/28/18   Almon Hercules, MD  losartan (COZAAR) 50 MG tablet Take 50 mg by mouth daily.    [provider]  predniSONE (DELTASONE) 2.5 MG tablet Take 3 tablets (7.5 mg total) by mouth daily with breakfast. 09/29/18   Almon Hercules, MD    Family History Family History  Problem Relation Age of Onset  . Rectal cancer Other   . Hypertension Other   . Diabetes Other   . Breast cancer Other   . Colon cancer Other   . Prostate cancer Other   . Stroke Other   . CAD Other   . Coronary artery disease Other     Social History Social History   Tobacco Use  . Smoking status: Never Smoker  . Smokeless tobacco: Never Used  Substance Use Topics  . Alcohol use: Not Currently  . Drug use: Never     Allergies   Prochlorperazine   Review of Systems Review of Systems  All other systems reviewed and are negative.  Physical Exam Updated Vital Signs BP (!) 136/102 (BP Location: Left Arm)   Pulse 78   Temp 98.5 F (36.9 C) (Oral)   Resp 14   SpO2 99%   Physical Exam Vitals signs and nursing note reviewed.  Constitutional:      Appearance: She is well-developed.  HENT:     Head: Normocephalic and atraumatic.  Eyes:     Conjunctiva/sclera: Conjunctivae normal.     Pupils: Pupils are equal, round, and reactive to light.  Neck:     Musculoskeletal: Normal range of motion and neck supple.  Cardiovascular:     Rate and Rhythm: Normal rate and regular rhythm.     Heart sounds: No murmur. No friction rub. No gallop.   Pulmonary:     Effort: Pulmonary effort is normal. No respiratory distress.     Breath sounds: Normal breath sounds. No wheezing or rales.   Chest:     Chest wall: No tenderness.  Abdominal:     General: Bowel sounds are normal. There is no distension.     Palpations: Abdomen is soft. There is no mass.     Tenderness: There is no abdominal tenderness. There is no guarding or rebound.  Musculoskeletal: Normal range of motion.        General: No tenderness.  Skin:    General: Skin is warm and dry.  Neurological:     Mental Status: She is alert and oriented to person, place, and time.  Psychiatric:        Behavior: Behavior normal.        Thought Content: Thought content normal.        Judgment: Judgment normal.      ED Treatments / Results  Labs (all labs ordered are listed, but only abnormal results are displayed) Labs Reviewed  COMPREHENSIVE METABOLIC PANEL  CBC WITH DIFFERENTIAL/PLATELET  PROTIME-INR  I-STAT BETA HCG BLOOD, ED ([facility], [facility], [facility] ONLY)  TYPE AND SCREEN    EKG None  Radiology No results found.  Procedures Procedures (including critical care time) CRITICAL CARE Performed by: Roxy Horsemanobert Antoneo Ghrist  GI bleed, hematochezia, dropping hgb 10.5->8.3 Total critical care time: 36 minutes  Critical care time was exclusive of separately billable procedures and treating other patients.  Critical care was necessary to treat or prevent imminent or life-threatening deterioration.  Critical care was time spent personally by me on the following activities: development of treatment plan with patient and/or surrogate as well as nursing, discussions with consultants, evaluation of patient's response to treatment, examination of patient, obtaining history from patient or surrogate, ordering and performing treatments and interventions, ordering and review of laboratory studies, ordering and review of radiographic studies, pulse oximetry and re-evaluation of patient's condition. Medications Ordered in ED Medications  pantoprazole (PROTONIX) 80 mg in sodium chloride 0.9 % 100 mL IVPB (has no administration in time range)   pantoprazole (PROTONIX) 80 mg in sodium chloride 0.9 % 250 mL (0.32 mg/mL) infusion (has no administration in time range)     Initial Impression / Assessment and Plan / ED Course  I have reviewed the triage vital signs and the nursing notes.  Pertinent labs & imaging results that were available during my care of the patient were reviewed by me and considered in my medical decision making (see chart for details).        Patient with GI bleeding.  Had gross hematochezia prior to arrival.  She feels fatigued.  She was admitted to the hospital recently for the same  and had colonoscopy performed, bleeding source was thought to be diverticular.  Bleeding improved with observation.  Blood counts remained stable while in the hospital.  GI recommendation during last hospitalization was that if patient had recurrence of her bleeding, she would need angiogram and interventional radiology.   I discussed the case with Dr. Elesa Massed, who agrees with plan for admission to the hospital.  Appreciate Dr. Toniann Fail for bringing patient into the hospital.  Final Clinical Impressions(s) / ED Diagnoses   Final diagnoses:  Acute GI bleeding    ED Discharge Orders    None       Roxy Horseman, PA-C 10/02/18 0217    Ward, Layla Maw, DO 10/02/18 [phone]

## 2018-10-02 NOTE — ED Notes (Signed)
Per Hospitalist, it is ok for patient to go to testing-per IR, there is a [employer] fundraiser present who will monitor transfusion-

## 2018-10-02 NOTE — ED Notes (Signed)
Report given to floor-patient will go to floor from IR

## 2018-10-02 NOTE — ED Notes (Signed)
CRITICAL VALUE ALERT  Critical Value:  Hgb 7.2  Date & Time Notied:  10/02/18 0620  Provider Notified: A.Kakrakandy  Orders Received/Actions taken: Waiting for orders

## 2018-10-02 NOTE — ED Notes (Signed)
Blood bank said they had blood ready,notified Stacy,RN

## 2018-10-02 NOTE — ED Notes (Signed)
Message sent to attending regarding transfusion

## 2018-10-02 NOTE — ED Triage Notes (Signed)
Patient arrived with EMS from home c/o abdominal pain started yesterday. Pt stated she was seen here last 2/17 with the same problem and got discharge last Friday. Per pt she had  1 occurrence of bloody stool last night.

## 2018-10-02 NOTE — ED Notes (Signed)
Bed: [facility]47 Expected date:  Expected time:  Means of arrival:  Comments: 48 yo F/rectal pain

## 2018-10-03 ENCOUNTER — Encounter (HOSPITAL_COMMUNITY)
Admission: EM | Disposition: A | Discharge status: Home / Self Care | Payer: Self-pay | Source: Home / Self Care | Attending: Nephrology

## 2018-10-03 ENCOUNTER — Encounter (HOSPITAL_COMMUNITY): Payer: Self-pay | Admitting: *Deleted

## 2018-10-03 HISTORY — PX: FLEXIBLE SIGMOIDOSCOPY: SHX5431

## 2018-10-03 LAB — BPAM RBC
Blood Product Expiration Date: 202003222359
Blood Product Expiration Date: 202003222359
ISSUE DATE / TIME: 202002250849
ISSUE DATE / TIME: 202002251420
Unit Type and Rh: 5100
Unit Type and Rh: 5100

## 2018-10-03 LAB — TYPE AND SCREEN
ABO/RH(D): O POS
Antibody Screen: NEGATIVE
UNIT DIVISION: 0
Unit division: 0

## 2018-10-03 LAB — HEMOGLOBIN AND HEMATOCRIT, BLOOD
HCT: 27.3 % — ABNORMAL LOW (ref 36.0–46.0)
HEMOGLOBIN: 8.4 g/dL — AB (ref 12.0–15.0)

## 2018-10-03 LAB — CBC
HCT: 30.2 % — ABNORMAL LOW (ref 36.0–46.0)
Hemoglobin: 9.3 g/dL — ABNORMAL LOW (ref 12.0–15.0)
MCH: 28.5 pg (ref 26.0–34.0)
MCHC: 30.8 g/dL (ref 30.0–36.0)
MCV: 92.6 fL (ref 80.0–100.0)
Platelets: 174 10*3/uL (ref 150–400)
RBC: 3.26 MIL/uL — ABNORMAL LOW (ref 3.87–5.11)
RDW: 13.4 % (ref 11.5–15.5)
WBC: 5.4 10*3/uL (ref 4.0–10.5)
nRBC: 0 % (ref 0.0–0.2)

## 2018-10-03 LAB — BASIC METABOLIC PANEL
Anion gap: 7 (ref 5–15)
BUN: 12 mg/dL (ref 6–20)
CALCIUM: 8.1 mg/dL — AB (ref 8.9–10.3)
CO2: 22 mmol/L (ref 22–32)
Chloride: 110 mmol/L (ref 98–111)
Creatinine, Ser: 0.92 mg/dL (ref 0.44–1.00)
GFR calc non Af Amer: >60 mL/min (ref >60)
Glucose, Bld: 75 mg/dL (ref 70–99)
Potassium: 3.8 mmol/L (ref 3.5–5.1)
Sodium: 139 mmol/L (ref 135–145)

## 2018-10-03 SURGERY — SIGMOIDOSCOPY, FLEXIBLE
Anesthesia: Moderate Sedation

## 2018-10-03 MED ORDER — FLEET ENEMA 7-19 GM/118ML RE ENEM
1.0000 | ENEMA | Freq: Once | RECTAL | Status: AC
  Administered 2018-10-03: 1 via RECTAL
  Filled 2018-10-03: qty 1

## 2018-10-03 MED ORDER — PANTOPRAZOLE SODIUM 40 MG PO TBEC
40.0000 mg | DELAYED_RELEASE_TABLET | Freq: Every day | ORAL | Status: DC
  Administered 2018-10-03 – 2018-10-05 (×3): 40 mg via ORAL
  Filled 2018-10-03 (×3): qty 1

## 2018-10-03 MED ORDER — PROMETHAZINE HCL 25 MG/ML IJ SOLN
12.5000 mg | INTRAMUSCULAR | Status: DC | PRN
  Administered 2018-10-03 (×2): 25 mg via INTRAVENOUS
  Filled 2018-10-03 (×2): qty 1

## 2018-10-03 NOTE — Progress Notes (Signed)
Pt refused suppository, states she wants to talk to doctor first.

## 2018-10-03 NOTE — Progress Notes (Signed)
PROGRESS NOTE  Crystal Winters BWG:[id number] DOB: 1971/02/06 DOA: 10/02/2018 PCP: System, Pcp Not In   LOS: 0 days   Brief Narrative / Interim history: 48 year old female with history of hypertension, lupus and rectal bleed presented with hematochezia, fatigue and presyncope. Recently hospitalized 2/17- 09/28/18 for rectal bleed.  CT abdomen and colonoscopy revealed diverticulosis which was thought to be the source of her bleeding.   In ED on representation 2/24 pt had a large bloody bowel movement.  Hemoglobin dropped by 1 g (greater than two-point from discharge).  Hemodynamically stable.  Admitted for further evaluation.  Next morning, we transfused 2 units of blood.  Gastroenterology consulted.  Bleeding scan ordered and did not show evidence of active gastrointestinal bleeding.   Subjective: Still having intermittent bleeding. Went for flex sig today.  Hb 9.3 this am.   Assessment & Plan: Principal Problem:   Acute GI bleeding Active Problems:   Essential hypertension   Lupus (HCC)   ARF (acute renal failure) (HCC)  Hematochezia/symptomatic anemia: recent admit 2/17- 09/28/18 for GIB, had colonoscopy on 09/27/2018 showed nonbleeding diverticulosis.  Now admitted 2/24 on Hb here 7.2 >> now Hb is 9.3 this am after 2u prbc's.   2/25 Bleeding scan negative.  BP's stable. GI  - GI did flex sig this am, no obvious bleeding source, old blood - Monitor H&H bid for now - other rec's per GI  History of lupus: On Plaquenil.  Treated with low-dose steroid for mild exacerbation last admission.  Stable. -Continue home Plaquenil  Hypertension: Normotensive.  DVT prophylaxis: SCD in the setting of GI bleed Code Status: Full code Family Communication: None at bedside Disposition Plan: Remains inpatient.   Rob Tax adviser [facility] pgr [phone] 10/03/2018, 2:37 PM     Scheduled Meds: . citalopram  10 mg Oral Daily  . hydrocortisone  25 mg Rectal BID  .  hydroxychloroquine  200 mg Oral BID  . pantoprazole  40 mg Oral Daily   Continuous Infusions:  PRN Meds:.acetaminophen **OR** acetaminophen, hydrALAZINE, ondansetron **OR** ondansetron (ZOFRAN) IV, oxyCODONE, promethazine, tiZANidine  Consultants:  Gastroenterology  Procedures:   Colonoscopy on 09/27/2018 showed nonbleeding descending and sigmoid diverticulosis.  Antimicrobials:  None  Objective: Vitals:   10/03/18 0549 10/03/18 0940 10/03/18 1021 10/03/18 1036  BP: (!) 128/98 (!) 146/87  (!) 153/94  Pulse: 67 77  63  Resp: 19 14 17 14   Temp: 98 F (36.7 C) 97.8 F (36.6 C)    TempSrc: Oral     SpO2: 99% 99%  100%  Weight: 96.4 kg 96.4 kg    Height:  6' (1.829 m)      Intake/Output Summary (Last 24 hours) at 10/03/2018 1433 Last data filed at 10/02/2018 1722 Gross per 24 hour  Intake 1193.35 ml  Output -  Net 1193.35 ml   Filed Weights   10/03/18 0549 10/03/18 0940  Weight: 96.4 kg 96.4 kg    Examination:  GENERAL: Appears well. No acute distress.  EYES - vision grossly intact. Sclera anicteric.  Conjunctival pale. NOSE- no gross deformity or drainage MOUTH - no oral lesions noted THROAT- no swelling or erythema LUNGS:  No IWOB. Good air movement. CTAB.  HEART: RRR. Heart sounds normal. ABD: Bowel sounds present. Soft.  Mild tenderness over RLQ. MSK/EXT: Moves extremities. No obvious deformity. SKIN: no apparent skin lesion.  Pale skin. NEURO: Awake, alert and oriented appropriately.  No gross deficit.  PSYCH: Calm. Normal affect.    Data Reviewed: I have independently reviewed  following labs and imaging studies  CBC: Recent Labs  Lab 09/27/18 0442 09/28/18 0439 10/02/18 0114 10/02/18 0515 10/02/18 1913 10/03/18 0636  WBC 6.6 7.6 8.6 6.5  --  5.4  NEUTROABS  --   --  6.2  --   --   --   HGB 10.8* 9.5* 8.3* 7.2* 10.2* 9.3*  HCT 34.7* 31.4* 25.7* 22.6* 32.9* 30.2*  MCV 89.4 91.5 89.9 90.8  --  92.6  PLT 201 196 208 182  --  174   Basic  Metabolic Panel: Recent Labs  Lab 09/27/18 0442 09/28/18 0439 10/02/18 0114 10/02/18 0515 10/03/18 0636  NA 139 139 139 138 139  K 3.5 3.6 3.7 3.8 3.8  CL 105 109 108 108 110  CO2 26 24 24 25 22   GLUCOSE 81 104* 121* 107* 75  BUN 21* 18 19 20 12   CREATININE 1.38* 0.95 1.02* 0.99 0.92  CALCIUM 8.8* 8.5* 8.3* 7.9* 8.1*   GFR: Estimated Creatinine Clearance: 97.3 mL/min (by C-G formula based on SCr of 0.92 mg/dL). Liver Function Tests: Recent Labs  Lab 10/02/18 0114  AST 18  ALT 14  ALKPHOS 67  BILITOT 0.3  PROT 6.4*  ALBUMIN 3.7   No results for input(s): LIPASE, AMYLASE in the last 168 hours. No results for input(s): AMMONIA in the last 168 hours. Coagulation Profile: Recent Labs  Lab 10/02/18 0114  INR 1.0   Cardiac Enzymes: No results for input(s): CKTOTAL, CKMB, CKMBINDEX, TROPONINI in the last 168 hours. BNP (last 3 results) No results for input(s): PROBNP in the last 8760 hours. HbA1C: No results for input(s): HGBA1C in the last 72 hours. CBG: No results for input(s): GLUCAP in the last 168 hours. Lipid Profile: No results for input(s): CHOL, HDL, LDLCALC, TRIG, CHOLHDL, LDLDIRECT in the last 72 hours. Thyroid Function Tests: No results for input(s): TSH, T4TOTAL, FREET4, T3FREE, THYROIDAB in the last 72 hours. Anemia Panel: No results for input(s): VITAMINB12, FOLATE, FERRITIN, TIBC, IRON, RETICCTPCT in the last 72 hours. Urine analysis:    Component Value Date/Time   COLORURINE AMBER (A) 09/24/2018 1154   APPEARANCEUR HAZY (A) 09/24/2018 1154   LABSPEC 1.032 (H) 09/24/2018 1154   PHURINE 5.0 09/24/2018 1154   GLUCOSEU NEGATIVE 09/24/2018 1154   HGBUR NEGATIVE 09/24/2018 1154   BILIRUBINUR SMALL (A) 09/24/2018 1154   KETONESUR 5 (A) 09/24/2018 1154   PROTEINUR 30 (A) 09/24/2018 1154   NITRITE NEGATIVE 09/24/2018 1154   LEUKOCYTESUR NEGATIVE 09/24/2018 1154   Sepsis Labs: Invalid input(s): PROCALCITONIN, LACTICIDVEN  Recent Results (from the  past 240 hour(s))  MRSA PCR Screening     Status: None   Collection Time: 09/25/18 12:38 AM  Result Value Ref Range Status   MRSA by PCR NEGATIVE NEGATIVE Final    Comment:        The GeneXpert MRSA Assay (FDA approved for NASAL specimens only), is one component of a comprehensive MRSA colonization surveillance program. It is not intended to diagnose MRSA infection nor to guide or monitor treatment for MRSA infections. Performed at [facility], [address], [place], [place] [address]      If 7PM-7AM, please contact night-coverage [email or web address] Password [facility] 10/03/2018, 2:33 PM

## 2018-10-03 NOTE — Brief Op Note (Addendum)
10/02/2018 - 10/03/2018  10:41 AM  PATIENT:  Crystal Winters  48 y.o. female  PRE-OPERATIVE DIAGNOSIS:  rectal bleeding  POST-OPERATIVE DIAGNOSIS:  probable diverticula bleed  PROCEDURE:  Procedure(s): FLEXIBLE SIGMOIDOSCOPY (N/A)  SURGEON:  Surgeon(s) and Role:    * Jacqualyn Sedgwick, MD - Primary  Findings ---------- -Flexible sigmoidoscopy showed poor prep and evidence of recent bleeding with blood clots and fresh blood in the sigmoid colon.  likely diverticular bleed.  Recommendations ------------------------- -Advance diet to full liquid diet -Supportive care - Monitor  H&H -GI will follow  Kathi Der MD, FACP 10/03/2018, 10:42 AM  Contact #  [phone]

## 2018-10-03 NOTE — Progress Notes (Signed)
[facility] Progress Note  Crystal Winters 48 y.o. Nov 14, 1970  CC: Rectal bleeding   Subjective: Continues to have intermittent rectal bleeding although it has improved since admission.  She denies abdominal pain, nausea vomiting.  ROS : Negative for chest pain and shortness of breath.  Negative for fever.  Negative for fatigue.   Objective: Vital signs in last 24 hours: Vitals:   10/02/18 1956 10/03/18 0549  BP: (!) 126/93 (!) 128/98  Pulse: 71 67  Resp: 18 19  Temp: 97.9 F (36.6 C) 98 F (36.7 C)  SpO2: 100% 99%    Physical Exam:  General:  Alert, cooperative, no distress, appears stated age  Head:  Normocephalic, without obvious abnormality, atraumatic  Eyes:  , EOM's intact,   Lungs:   Clear to auscultation bilaterally, respirations unlabored  Heart:  Regular rate and rhythm, S1, S2 normal  Abdomen:   Soft, non-tender, bowel sounds present, nondistended, no peritoneal signs  Extremities: Extremities normal, atraumatic, no  edema  Pulses: 2+ and symmetric    Lab Results: Recent Labs    10/02/18 0515 10/03/18 0636  NA 138 139  K 3.8 3.8  CL 108 110  CO2 25 22  GLUCOSE 107* 75  BUN 20 12  CREATININE 0.99 0.92  CALCIUM 7.9* 8.1*   Recent Labs    10/02/18 0114  AST 18  ALT 14  ALKPHOS 67  BILITOT 0.3  PROT 6.4*  ALBUMIN 3.7   Recent Labs    10/02/18 0114 10/02/18 0515 10/02/18 1913 10/03/18 0636  WBC 8.6 6.5  --  5.4  NEUTROABS 6.2  --   --   --   HGB 8.3* 7.2* 10.2* 9.3*  HCT 25.7* 22.6* 32.9* 30.2*  MCV 89.9 90.8  --  92.6  PLT 208 182  --  174   Recent Labs    10/02/18 0114  LABPROT 13.3  INR 1.0      Assessment/Plan: - Rectal bleeding.  Most likely hemorrhoidal versus diverticular in nature.  Colonoscopy September 27, 2018 showed diverticulosis otherwise no evidence of active bleeding. - Acute blood loss anemia.  Recommendations ------------------------- -GI bleeding scan negative.  Continues to have intermittent  bleeding now mostly after bowel movements and  on the tissue paper.  Her bleeding most likely is from hemorrhoids but she would like to have a flexible sigmoidoscopy done today to rule out other etiology.  -Less likely to have upper GI bleed.  Normal BUN.  Change Protonix to p.o. once a day.  -Unsedated flexible sigmoidoscopy today. -Continue  Anusol HC suppositories. -Further plan based on flexible sigmoidoscopy findings.    Kathi Der MD, FACP 10/03/2018, 8:41 AM  Contact #  [phone]

## 2018-10-03 NOTE — Op Note (Signed)
[facility] Patient Name: Crystal Winters Procedure Date: 10/03/2018 MRN: [id number] Attending MD: Kathi Der , MD Date of Birth: [birth year] CSN: [id number] Age: 48 Admit Type: Inpatient Procedure:                Flexible Sigmoidoscopy Indications:              Rectal hemorrhage Providers:                Kathi Der, MD, Dwain Sarna, RN, Zoila Shutter, Technician Referring MD:              Medicines:                None Complications:            No immediate complications. Estimated Blood Loss:     Estimated blood loss: none. Procedure:                Pre-Anesthesia Assessment:                           - Prior to the procedure, a History and Physical                            was performed, and patient medications and                            allergies were reviewed. The patient's tolerance of                            previous anesthesia was also reviewed. The risks                            and benefits of the procedure and the sedation                            options and risks were discussed with the patient.                            All questions were answered, and informed consent                            was obtained. Prior Anticoagulants: The patient has                            taken no previous anticoagulant or antiplatelet                            agents. After reviewing the risks and benefits, the                            patient was deemed in satisfactory condition to                            undergo the procedure.  After obtaining informed consent, the scope was                            passed under direct vision. The GIF-H190 ([id number])                            Olympus gastroscope was introduced through the anus                            and advanced to the 40 cm from the anal verge. The                            flexible sigmoidoscopy was accomplished without                       difficulty. The patient tolerated the procedure                            well. The quality of the bowel preparation was                            inadequate. Scope In: Scope Out: Findings:      Skin tags were found on perianal exam.      Red blood was found in the sigmoid colon.      The retroflexed view of the distal rectum and anal verge was normal and       showed no anal or rectal abnormalities. Impression:               - Preparation of the colon was inadequate.                           - Perianal skin tags found on perianal exam.                           - Blood in the sigmoid colon.                           - No specimens collected. Moderate Sedation:      Not Applicable Recommendation:           - Return patient to hospital ward for ongoing care.                            Most likely Diverticular bleeding                           - Resume previous diet.                           - Continue present medications. Procedure Code(s):        --- Professional ---                           [phone], Sigmoidoscopy, flexible; diagnostic,  including collection of specimen(s) by brushing or                            washing, when performed (separate procedure) Diagnosis Code(s):        --- Professional ---                           K92.2, Gastrointestinal hemorrhage, unspecified                           K64.4, Residual hemorrhoidal skin tags                           K62.5, Hemorrhage of anus and rectum CPT copyright 2018 American Medical Association. All rights reserved. The codes documented in this report are preliminary and upon coder review may  be revised to meet current compliance requirements. Kathi Der, MD Kathi Der, MD 10/03/2018 10:38:20 AM Number of Addenda: 0

## 2018-10-04 ENCOUNTER — Encounter (HOSPITAL_COMMUNITY): Payer: Self-pay | Admitting: Gastroenterology

## 2018-10-04 DIAGNOSIS — D62 Acute posthemorrhagic anemia: Secondary | ICD-10-CM

## 2018-10-04 LAB — CBC
HCT: 27 % — ABNORMAL LOW (ref 36.0–46.0)
Hemoglobin: 8.3 g/dL — ABNORMAL LOW (ref 12.0–15.0)
MCH: 28 pg (ref 26.0–34.0)
MCHC: 30.7 g/dL (ref 30.0–36.0)
MCV: 91.2 fL (ref 80.0–100.0)
PLATELETS: 172 10*3/uL (ref 150–400)
RBC: 2.96 MIL/uL — ABNORMAL LOW (ref 3.87–5.11)
RDW: 13.4 % (ref 11.5–15.5)
WBC: 6 10*3/uL (ref 4.0–10.5)
nRBC: 0 % (ref 0.0–0.2)

## 2018-10-04 LAB — HEMOGLOBIN AND HEMATOCRIT, BLOOD
HCT: 28.7 % — ABNORMAL LOW (ref 36.0–46.0)
HCT: 26.7 % — ABNORMAL LOW (ref 36.0–46.0)
Hemoglobin: 8.9 g/dL — ABNORMAL LOW (ref 12.0–15.0)
Hemoglobin: 8.3 g/dL — ABNORMAL LOW (ref 12.0–15.0)

## 2018-10-04 NOTE — Progress Notes (Signed)
Patient lost IV after restroom and prefers not to get stuck again tonight. Wasted IV Zofran 2mg . Gave po. Will continue to monitor.

## 2018-10-04 NOTE — Progress Notes (Signed)
[facility] Progress Note  Crystal Winters 48 y.o. [birth year]  CC: Rectal bleeding   Subjective: Bleeding episode last night but no bowel movement this morning.  Complaining of left lower quadrant discomfort.  Denies nausea vomiting.  ROS : Negative for chest pain and shortness of breath.  Negative for fever.  Negative for fatigue.   Objective: Vital signs in last 24 hours: Vitals:   10/03/18 2035 10/04/18 0509  BP: 124/78 (!) 138/96  Pulse: 67 64  Resp: 19 18  Temp: 97.8 F (36.6 C) 98.1 F (36.7 C)  SpO2: 99% 99%    Physical Exam:  General:  Alert, cooperative, no distress, appears stated age  Head:  Normocephalic, without obvious abnormality, atraumatic  Eyes:  , EOM's intact,   Lungs:   Clear to auscultation bilaterally, respirations unlabored  Heart:  Regular rate and rhythm, S1, S2 normal  Abdomen:   Soft, non-tender, bowel sounds present, nondistended, mild left lower quadrant discomfort without tenderness no peritoneal signs  Extremities: Extremities normal, atraumatic, no  edema  Pulses: 2+ and symmetric    Lab Results: Recent Labs    10/02/18 0515 10/03/18 0636  NA 138 139  K 3.8 3.8  CL 108 110  CO2 25 22  GLUCOSE 107* 75  BUN 20 12  CREATININE 0.99 0.92  CALCIUM 7.9* 8.1*   Recent Labs    10/02/18 0114  AST 18  ALT 14  ALKPHOS 67  BILITOT 0.3  PROT 6.4*  ALBUMIN 3.7   Recent Labs    10/02/18 0114  10/03/18 0636 10/03/18 1516 10/04/18 0551  WBC 8.6   < > 5.4  --  6.0  NEUTROABS 6.2  --   --   --   --   HGB 8.3*   < > 9.3* 8.4* 8.3*  HCT 25.7*   < > 30.2* 27.3* 27.0*  MCV 89.9   < > 92.6  --  91.2  PLT 208   < > 174  --  172   < > = values in this interval not displayed.   Recent Labs    10/02/18 0114  LABPROT 13.3  INR 1.0      Assessment/Plan: - Rectal bleeding.  Most likely diverticular in nature.  Negative colonoscopy on 02/20/20202 for active bleeding.  Negative bleeding scan on October 02, 2018. - Acute  blood loss anemia.  Hgb stable  Recommendations ------------------------- - Flexible sigmoidoscopy 10/03/2018  showed poor prep and evidence of recent bleeding with blood clots and fresh blood in the sigmoid colon, consistent with most likely diverticular bleeding. -GI bleeding scan negative.   -Recommend supportive care.  Okay to stop Anusol suppository. -Advance diet.  Monitor H&H.  Hopefully discharge home tomorrow.     Kathi Der MD, FACP 10/04/2018, 9:40 AM  Contact #  [phone]

## 2018-10-04 NOTE — Progress Notes (Signed)
PROGRESS NOTE  Crystal Winters KPV:[id number] DOB: September 11, 1970 DOA: 10/02/2018 PCP: System, Pcp Not In   LOS: 1 day   Brief Narrative / Interim history: 48 year old female with history of hypertension, lupus and rectal bleed presented with hematochezia, fatigue and presyncope. Recently hospitalized 2/17- 09/28/18 for rectal bleed.  CT abdomen and colonoscopy revealed diverticulosis which was thought to be the source of her bleeding.   In ED on representation 2/24 pt had a large bloody bowel movement.  Hemoglobin dropped by 1 g (greater than two-point from discharge).  Hemodynamically stable.  Admitted for further evaluation.  Next morning, we transfused 2 units of blood.  Gastroenterology consulted.  Bleeding scan ordered and did not show evidence of active gastrointestinal bleeding.   Subjective: Had bleeding yest evening but today no bleeding so far.   Assessment & Plan: Principal Problem:   Acute GI bleeding Active Problems:   Essential hypertension   Lupus (HCC)   ARF (acute renal failure) (HCC)  Hematochezia/symptomatic anemia: recent admit 2/17- 09/28/18 for GIB, had colonoscopy on 09/27/2018 showed nonbleeding diverticulosis.  Now admitted again on 2/24 on Hb w Hb 7.2 >>  9.3 after 2u prbc's on 2/25.   2/25 Bleeding scan neg.  Flexible sigmoidoscopy 2/26 showed poor prep and evidence of recent bleeding with blood clots and fresh blood in the sigmoid colon, consistent with most likely diverticular bleeding -- last rectal bleeding episode was 2/26 evening, today has not had any bleeding -- Hb 8.3 this am and 8.9 this afternoon -- per GI supportive care, hopefully dc tomorrow   History of lupus: On Plaquenil.  Treated with low-dose steroid for mild exacerbation last admission.  Stable. -Continue home Plaquenil  Hypertension: Normotensive.  DVT prophylaxis: SCD in the setting of GI bleed Code Status: Full code Family Communication: None at bedside Disposition Plan: Remains  inpatient.   Rob Tax adviser [facility] pgr [phone] 10/04/2018, 4:20 PM     Scheduled Meds: . citalopram  10 mg Oral Daily  . hydroxychloroquine  200 mg Oral BID  . pantoprazole  40 mg Oral Daily   Continuous Infusions:  PRN Meds:.acetaminophen **OR** acetaminophen, hydrALAZINE, ondansetron **OR** ondansetron (ZOFRAN) IV, oxyCODONE, promethazine, tiZANidine  Consultants:  Gastroenterology  Procedures:   Colonoscopy on 09/27/2018 showed nonbleeding descending and sigmoid diverticulosis.  Antimicrobials:  None  Objective: Vitals:   10/03/18 2035 10/04/18 0509 10/04/18 0601 10/04/18 1452  BP: 124/78 (!) 138/96  140/89  Pulse: 67 64  68  Resp: 19 18  20   Temp: 97.8 F (36.6 C) 98.1 F (36.7 C)  97.6 F (36.4 C)  TempSrc: Oral     SpO2: 99% 99%  99%  Weight:   96.4 kg   Height:       No intake or output data in the 24 hours ending 10/04/18 1620 Filed Weights   10/03/18 0549 10/03/18 0940 10/04/18 0601  Weight: 96.4 kg 96.4 kg 96.4 kg    Examination:  GENERAL: Appears well. No acute distress.  EYES - vision grossly intact. Sclera anicteric.  Conjunctival pale. NOSE- no gross deformity or drainage MOUTH - no oral lesions noted THROAT- no swelling or erythema LUNGS:  No IWOB. Good air movement. CTAB.  HEART: RRR. Heart sounds normal. ABD: Bowel sounds present. Soft.  Mild tenderness over RLQ. MSK/EXT: Moves extremities. No obvious deformity. SKIN: no apparent skin lesion.  Pale skin. NEURO: Awake, alert and oriented appropriately.  No gross deficit.  PSYCH: Calm. Normal affect.    Data Reviewed: I have  independently reviewed following labs and imaging studies  CBC: Recent Labs  Lab 09/28/18 0439 10/02/18 0114 10/02/18 0515 10/02/18 1913 10/03/18 0636 10/03/18 1516 10/04/18 0551 10/04/18 1314  WBC 7.6 8.6 6.5  --  5.4  --  6.0  --   NEUTROABS  --  6.2  --   --   --   --   --   --   HGB 9.5* 8.3* 7.2* 10.2* 9.3* 8.4* 8.3* 8.9*    HCT 31.4* 25.7* 22.6* 32.9* 30.2* 27.3* 27.0* 28.7*  MCV 91.5 89.9 90.8  --  92.6  --  91.2  --   PLT 196 208 182  --  174  --  172  --    Basic Metabolic Panel: Recent Labs  Lab 09/28/18 0439 10/02/18 0114 10/02/18 0515 10/03/18 0636  NA 139 139 138 139  K 3.6 3.7 3.8 3.8  CL 109 108 108 110  CO2 24 24 25 22   GLUCOSE 104* 121* 107* 75  BUN 18 19 20 12   CREATININE 0.95 1.02* 0.99 0.92  CALCIUM 8.5* 8.3* 7.9* 8.1*   GFR: Estimated Creatinine Clearance: 97.3 mL/min (by C-G formula based on SCr of 0.92 mg/dL). Liver Function Tests: Recent Labs  Lab 10/02/18 0114  AST 18  ALT 14  ALKPHOS 67  BILITOT 0.3  PROT 6.4*  ALBUMIN 3.7   No results for input(s): LIPASE, AMYLASE in the last 168 hours. No results for input(s): AMMONIA in the last 168 hours. Coagulation Profile: Recent Labs  Lab 10/02/18 0114  INR 1.0   Cardiac Enzymes: No results for input(s): CKTOTAL, CKMB, CKMBINDEX, TROPONINI in the last 168 hours. BNP (last 3 results) No results for input(s): PROBNP in the last 8760 hours. HbA1C: No results for input(s): HGBA1C in the last 72 hours. CBG: No results for input(s): GLUCAP in the last 168 hours. Lipid Profile: No results for input(s): CHOL, HDL, LDLCALC, TRIG, CHOLHDL, LDLDIRECT in the last 72 hours. Thyroid Function Tests: No results for input(s): TSH, T4TOTAL, FREET4, T3FREE, THYROIDAB in the last 72 hours. Anemia Panel: No results for input(s): VITAMINB12, FOLATE, FERRITIN, TIBC, IRON, RETICCTPCT in the last 72 hours. Urine analysis:    Component Value Date/Time   COLORURINE AMBER (A) 09/24/2018 1154   APPEARANCEUR HAZY (A) 09/24/2018 1154   LABSPEC 1.032 (H) 09/24/2018 1154   PHURINE 5.0 09/24/2018 1154   GLUCOSEU NEGATIVE 09/24/2018 1154   HGBUR NEGATIVE 09/24/2018 1154   BILIRUBINUR SMALL (A) 09/24/2018 1154   KETONESUR 5 (A) 09/24/2018 1154   PROTEINUR 30 (A) 09/24/2018 1154   NITRITE NEGATIVE 09/24/2018 1154   LEUKOCYTESUR NEGATIVE  09/24/2018 1154   Sepsis Labs: Invalid input(s): PROCALCITONIN, LACTICIDVEN  Recent Results (from the past 240 hour(s))  MRSA PCR Screening     Status: None   Collection Time: 09/25/18 12:38 AM  Result Value Ref Range Status   MRSA by PCR NEGATIVE NEGATIVE Final    Comment:        The GeneXpert MRSA Assay (FDA approved for NASAL specimens only), is one component of a comprehensive MRSA colonization surveillance program. It is not intended to diagnose MRSA infection nor to guide or monitor treatment for MRSA infections. Performed at [facility], [address], [place], [place] [address]      If 7PM-7AM, please contact night-coverage [email or web address] Password [email or web address] 10/04/2018, 4:20 PM

## 2018-10-05 LAB — COMPREHENSIVE METABOLIC PANEL
ALT: 12 U/L (ref 0–44)
AST: 14 U/L — ABNORMAL LOW (ref 15–41)
Albumin: 3.4 g/dL — ABNORMAL LOW (ref 3.5–5.0)
Alkaline Phosphatase: 56 U/L (ref 38–126)
Anion gap: 4 — ABNORMAL LOW (ref 5–15)
BILIRUBIN TOTAL: 0.2 mg/dL — AB (ref 0.3–1.2)
BUN: 13 mg/dL (ref 6–20)
CO2: 27 mmol/L (ref 22–32)
Calcium: 8.3 mg/dL — ABNORMAL LOW (ref 8.9–10.3)
Chloride: 108 mmol/L (ref 98–111)
Creatinine, Ser: 1.05 mg/dL — ABNORMAL HIGH (ref 0.44–1.00)
GFR calc Af Amer: >60 mL/min (ref >60)
GFR calc non Af Amer: >60 mL/min (ref >60)
Glucose, Bld: 94 mg/dL (ref 70–99)
Potassium: 3.5 mmol/L (ref 3.5–5.1)
Sodium: 139 mmol/L (ref 135–145)
TOTAL PROTEIN: 5.7 g/dL — AB (ref 6.5–8.1)

## 2018-10-05 LAB — CBC
HEMATOCRIT: 26.9 % — AB (ref 36.0–46.0)
Hemoglobin: 8.2 g/dL — ABNORMAL LOW (ref 12.0–15.0)
MCH: 28.2 pg (ref 26.0–34.0)
MCHC: 30.5 g/dL (ref 30.0–36.0)
MCV: 92.4 fL (ref 80.0–100.0)
Platelets: 185 10*3/uL (ref 150–400)
RBC: 2.91 MIL/uL — ABNORMAL LOW (ref 3.87–5.11)
RDW: 13.3 % (ref 11.5–15.5)
WBC: 5.8 10*3/uL (ref 4.0–10.5)
nRBC: 0 % (ref 0.0–0.2)

## 2018-10-05 MED ORDER — PROMETHAZINE HCL 12.5 MG PO TABS: 12.5000 mg | ORAL_TABLET | Freq: Four times a day (QID) | ORAL | 0 refills | Status: DC | PRN

## 2018-10-05 MED ORDER — OXYCODONE HCL 5 MG PO TABS: 5.0000 mg | ORAL_TABLET | Freq: Four times a day (QID) | ORAL | 0 refills | Status: DC | PRN

## 2018-10-05 NOTE — Progress Notes (Signed)
[facility] Progress Note  Crystal Winters 48 y.o. 1970/08/26  CC: Rectal bleeding   Subjective: No further bleeding episodes.  Tolerating diet without abdominal pain.  Denies nausea or vomiting.  ROS : Negative for chest pain and shortness of breath.  Negative for fever.  Negative for fatigue.   Objective: Vital signs in last 24 hours: Vitals:   10/04/18 2006 10/05/18 0540  BP: 136/88 (!) 139/95  Pulse: 72 72  Resp: 20 20  Temp: 98.2 F (36.8 C) 97.9 F (36.6 C)  SpO2: 97% 96%    Physical Exam:  General:  Alert, cooperative, no distress, appears stated age  Head:  Normocephalic, without obvious abnormality, atraumatic  Eyes:  , EOM's intact,   Lungs:   Clear to auscultation bilaterally, respirations unlabored  Heart:  Regular rate and rhythm, S1, S2 normal  Abdomen:   Soft, non-tender, bowel sounds present, nondistended, no peritoneal signs.  Extremities: Extremities normal, atraumatic, no  edema  Pulses: 2+ and symmetric    Lab Results: Recent Labs    10/03/18 0636 10/05/18 0538  NA 139 139  K 3.8 3.5  CL 110 108  CO2 22 27  GLUCOSE 75 94  BUN 12 13  CREATININE 0.92 1.05*  CALCIUM 8.1* 8.3*   Recent Labs    10/05/18 0538  AST 14*  ALT 12  ALKPHOS 56  BILITOT 0.2*  PROT 5.7*  ALBUMIN 3.4*   Recent Labs    10/04/18 0551  10/04/18 2041 10/05/18 0538  WBC 6.0  --   --  5.8  HGB 8.3*   < > 8.3* 8.2*  HCT 27.0*   < > 26.7* 26.9*  MCV 91.2  --   --  92.4  PLT 172  --   --  185   < > = values in this interval not displayed.   No results for input(s): LABPROT, INR in the last 72 hours.    Assessment/Plan: - Rectal bleeding.  Most likely diverticular in nature.  Negative colonoscopy on 02/20/20202 for active bleeding.  Negative bleeding scan on October 02, 2018.  Bleeding resolved. - Acute blood loss anemia.  Hgb stable  Recommendations ------------------------- - Flexible sigmoidoscopy 10/03/2018  showed poor prep and evidence of  recent bleeding with blood clots and fresh blood in the sigmoid colon, consistent with most likely diverticular bleeding. -GI bleeding scan negative.    -No further bleeding episodes.  - Okay to discharge from GI standpoint.  GI will sign off.  Call us back if needed      Kathi Der MD, FACP 10/05/2018, 8:39 AM  Contact #  [phone]

## 2018-10-05 NOTE — Discharge Summary (Signed)
Physician Discharge Summary  Crystal Winters QDU:[id number] DOB: July 01, 1971 DOA: 10/02/2018  PCP: System, Pcp Not In  Admit date: 10/02/2018 Discharge date: 10/05/2018  Admitted From: Home Disposition: Home  Recommendations for Outpatient Follow-up:  1. Follow up with community health and wellness in 1 week 2. Please obtain BMP/CBC in one week to follow-up on the hemoglobin   Home Health none Equipment/Devices none  Discharge Condition: Stable and improved CODE STATUS: Full code Diet recommendation: Cardiac  Brief/Interim Summary:48 year old female with history of hypertension, lupus and rectal bleed presented with hematochezia, fatigue and presyncope. Recently hospitalized 2/17- 09/28/18 for rectal bleed.  CT abdomen and colonoscopy revealed diverticulosis which was thought to be the source of her bleeding.   In ED on representation 2/24 pt had a large bloody bowel movement.  Hemoglobin dropped by 1 g (greater than two-point from discharge).  Hemodynamically stable.  Admitted for further evaluation.  Next morning, we transfused 2 units of blood.  Gastroenterology consulted.  Bleeding scan ordered and did not show evidence of active gastrointestinal bleeding.     Discharge Diagnoses:  Principal Problem:   Acute GI bleeding Active Problems:   Essential hypertension   Lupus (HCC)   ARF (acute renal failure) (HCC)   Acute blood loss anemia   Hematochezia/symptomatic anemia: recent admit 2/17- 09/28/18 for GIB, had colonoscopy on 09/27/2018 showed nonbleeding diverticulosis.  Now admitted again on 2/24 on Hb w Hb 7.2 >>  9.3 after 2u prbc's on 2/25.   2/25 Bleeding scan neg.  Flexible sigmoidoscopy 2/26 showed poor prep and evidence of recent bleeding with blood clots and fresh blood in the sigmoid colon,consistent with most likely diverticular bleeding -- last rectal bleeding episode was 2/26 evening, today has not had any bleeding -- Hb 8.2 this am -- per GI supportive  care  History of lupus: On Plaquenil.  Treated with low-dose steroid for mild exacerbation last admission.  Stable. -Continue home Plaquenil  Hypertension: Normotensive.  Estimated body mass index is 28.83 kg/m as calculated from the following:   Height as of this encounter: 6' (1.829 m).   Weight as of this encounter: 96.4 kg.  Discharge Instructions  Discharge Instructions    Call MD for:  difficulty breathing, headache or visual disturbances   Complete by:  As directed    Call MD for:  persistant dizziness or light-headedness   Complete by:  As directed    Call MD for:  persistant nausea and vomiting   Complete by:  As directed    Diet - low sodium heart healthy   Complete by:  As directed    Increase activity slowly   Complete by:  As directed      Allergies as of 10/05/2018      Reactions   Prochlorperazine Other (See Comments)   Muscle stiffness      Medication List    STOP taking these medications   predniSONE 2.5 MG tablet Commonly known as:  DELTASONE     TAKE these medications   amLODipine 10 MG tablet Commonly known as:  NORVASC Take 10 mg by mouth daily.   citalopram 10 MG tablet Commonly known as:  CELEXA Take 10 mg by mouth daily.   dimenhyDRINATE 50 MG tablet Commonly known as:  DRAMAMINE Take 50 mg by mouth daily as needed for nausea or dizziness.   hydroxychloroquine 200 MG tablet Commonly known as:  PLAQUENIL Take 1 tablet (200 mg total) by mouth 2 (two) times daily.   losartan 50 MG tablet  Commonly known as:  COZAAR Take 50 mg by mouth daily.   Melatonin 3 MG Caps Take 3 mg by mouth at bedtime.   oxyCODONE 5 MG immediate release tablet Commonly known as:  Oxy IR/ROXICODONE Take 1 tablet (5 mg total) by mouth every 6 (six) hours as needed for moderate pain, severe pain or breakthrough pain.   promethazine 12.5 MG tablet Commonly known as:  PHENERGAN Take 1 tablet (12.5 mg total) by mouth every 6 (six) hours as needed for nausea  or vomiting.   tiZANidine 2 MG tablet Commonly known as:  ZANAFLEX Take 2 mg by mouth every 8 (eight) hours as needed for muscle spasms.      Follow-up Information    [facility] Follow up.   Contact information: [address] [place] [id number] [phone]         Allergies  Allergen Reactions  . Prochlorperazine Other (See Comments)    Muscle stiffness    Consultations:gi  Procedures/Studies: Nm Gi Blood Loss  Result Date: 10/02/2018 CLINICAL DATA:  RIGHT red blood per rectum.  GI bleeding. EXAM: NUCLEAR MEDICINE GASTROINTESTINAL BLEEDING SCAN TECHNIQUE: Sequential abdominal images were obtained following intravenous administration of Tc-5953m labeled red blood cells. RADIOPHARMACEUTICALS:  23.2 mCi Tc-4153m pertechnetate in-vitro labeled red cells. COMPARISON:  CT 09/24/2018 FINDINGS: No accumulation of tagged red blood cells within the abdomen pelvis to localize active gastrointestinal bleeding. Dynamic imaging over 2 hours. Physiologic activity noted in the blood pool, solid organs and GU tract. IMPRESSION: No scintigraphic evidence of active gastrointestinal bleeding Electronically Signed   By: Genevive BiStewart  Edmunds M.D.   On: 10/02/2018 14:07   Ct Abdomen Pelvis W Contrast  Result Date: 09/24/2018 CLINICAL DATA:  Abdominal distention, pain, rectal bleeding EXAM: CT ABDOMEN AND PELVIS WITH CONTRAST TECHNIQUE: Multidetector CT imaging of the abdomen and pelvis was performed using the standard protocol following bolus administration of intravenous contrast. CONTRAST:  100mL ISOVUE-300 IOPAMIDOL (ISOVUE-300) INJECTION 61% COMPARISON:  None. FINDINGS: Lower chest: No acute abnormality. Hepatobiliary: No focal liver abnormality is seen. Status post cholecystectomy. No biliary dilatation. Pancreas: Unremarkable. No pancreatic ductal dilatation or surrounding inflammatory changes. Spleen: Normal in size without focal abnormality.  Adrenals/Urinary Tract: Adrenal glands are unremarkable. Kidneys are normal, without renal calculi, focal lesion, or hydronephrosis. Bladder is unremarkable. Stomach/Bowel: Stomach is within normal limits. Appendix appears normal. No evidence of bowel wall thickening, distention, or inflammatory changes. Descending and sigmoid diverticulosis. Vascular/Lymphatic: No significant vascular findings are present. No enlarged abdominal or pelvic lymph nodes. Reproductive: No mass or other abnormality. Status post hysterectomy. Other: No abdominal wall hernia or abnormality. No abdominopelvic ascites. Musculoskeletal: No acute or significant osseous findings. IMPRESSION: 1. No definite CT findings to explain abdominal pain and rectal bleeding. 2. Descending and sigmoid diverticulosis without intraluminal contrast or other findings to specifically localize rectal bleeding on this non tailored examination for evaluation of GI bleeding. Electronically Signed   By: Lauralyn PrimesAlex  Bibbey M.D.   On: 09/24/2018 16:10    (Echo, Carotid, EGD, Colonoscopy, ERCP)    Subjective:  Patient resting in bed in no acute distress has not had any further bleed tolerating p.o. intake complains of abdominal cramps after eating Discharge Exam: Vitals:   10/04/18 2006 10/05/18 0540  BP: 136/88 (!) 139/95  Pulse: 72 72  Resp: 20 20  Temp: 98.2 F (36.8 C) 97.9 F (36.6 C)  SpO2: 97% 96%   Vitals:   10/04/18 0601 10/04/18 1452 10/04/18 2006 10/05/18 0540  BP:  140/89 136/88 (!) 139/95  Pulse:  68 72 72  Resp:  20 20 20   Temp:  97.6 F (36.4 C) 98.2 F (36.8 C) 97.9 F (36.6 C)  TempSrc:      SpO2:  99% 97% 96%  Weight: 96.4 kg     Height:        General: Pt is alert, awake, not in acute distress Cardiovascular: RRR, S1/S2 +, no rubs, no gallops Respiratory: CTA bilaterally, no wheezing, no rhonchi Abdominal: Soft, NT, ND, bowel sounds + Extremities: no edema, no cyanosis    The results of significant diagnostics  from this hospitalization (including imaging, microbiology, ancillary and laboratory) are listed below for reference.     Microbiology: No results found for this or any previous visit (from the past 240 hour(s)).   Labs: BNP (last 3 results) No results for input(s): BNP in the last 8760 hours. Basic Metabolic Panel: Recent Labs  Lab 10/02/18 0114 10/02/18 0515 10/03/18 0636 10/05/18 0538  NA 139 138 139 139  K 3.7 3.8 3.8 3.5  CL 108 108 110 108  CO2 24 25 22 27   GLUCOSE 121* 107* 75 94  BUN 19 20 12 13   CREATININE 1.02* 0.99 0.92 1.05*  CALCIUM 8.3* 7.9* 8.1* 8.3*   Liver Function Tests: Recent Labs  Lab 10/02/18 0114 10/05/18 0538  AST 18 14*  ALT 14 12  ALKPHOS 67 56  BILITOT 0.3 0.2*  PROT 6.4* 5.7*  ALBUMIN 3.7 3.4*   No results for input(s): LIPASE, AMYLASE in the last 168 hours. No results for input(s): AMMONIA in the last 168 hours. CBC: Recent Labs  Lab 10/02/18 0114 10/02/18 0515  10/03/18 0636 10/03/18 1516 10/04/18 0551 10/04/18 1314 10/04/18 2041 10/05/18 0538  WBC 8.6 6.5  --  5.4  --  6.0  --   --  5.8  NEUTROABS 6.2  --   --   --   --   --   --   --   --   HGB 8.3* 7.2*   < > 9.3* 8.4* 8.3* 8.9* 8.3* 8.2*  HCT 25.7* 22.6*   < > 30.2* 27.3* 27.0* 28.7* 26.7* 26.9*  MCV 89.9 90.8  --  92.6  --  91.2  --   --  92.4  PLT 208 182  --  174  --  172  --   --  185   < > = values in this interval not displayed.   Cardiac Enzymes: No results for input(s): CKTOTAL, CKMB, CKMBINDEX, TROPONINI in the last 168 hours. BNP: Invalid input(s): POCBNP CBG: No results for input(s): GLUCAP in the last 168 hours. D-Dimer No results for input(s): DDIMER in the last 72 hours. Hgb A1c No results for input(s): HGBA1C in the last 72 hours. Lipid Profile No results for input(s): CHOL, HDL, LDLCALC, TRIG, CHOLHDL, LDLDIRECT in the last 72 hours. Thyroid function studies No results for input(s): TSH, T4TOTAL, T3FREE, THYROIDAB in the last 72 hours.  Invalid  input(s): FREET3 Anemia work up No results for input(s): VITAMINB12, FOLATE, FERRITIN, TIBC, IRON, RETICCTPCT in the last 72 hours. Urinalysis    Component Value Date/Time   COLORURINE AMBER (A) 09/24/2018 1154   APPEARANCEUR HAZY (A) 09/24/2018 1154   LABSPEC 1.032 (H) 09/24/2018 1154   PHURINE 5.0 09/24/2018 1154   GLUCOSEU NEGATIVE 09/24/2018 1154   HGBUR NEGATIVE 09/24/2018 1154   BILIRUBINUR SMALL (A) 09/24/2018 1154   KETONESUR 5 (A) 09/24/2018 1154   PROTEINUR 30 (A)  09/24/2018 1154   NITRITE NEGATIVE 09/24/2018 1154   LEUKOCYTESUR NEGATIVE 09/24/2018 1154   Sepsis Labs Invalid input(s): PROCALCITONIN,  WBC,  LACTICIDVEN Microbiology No results found for this or any previous visit (from the past 240 hour(s)).   Time coordinating discharge: 33 minutes  SIGNED:   Alwyn Ren, MD  [facility] 10/05/2018, 8:52 AM Pager   If 7PM-7AM, please contact night-coverage [email or web address] Password [email or web address]

## 2018-10-12 ENCOUNTER — Encounter: Payer: Self-pay | Admitting: Family Medicine

## 2018-10-12 ENCOUNTER — Ambulatory Visit (INDEPENDENT_AMBULATORY_CARE_PROVIDER_SITE_OTHER): Payer: Self-pay | Admitting: Family Medicine

## 2018-10-12 VITALS — BP 158/82 | HR 75 | Temp 98.0°F | Ht 72.0 in | Wt 207.0 lb

## 2018-10-12 DIAGNOSIS — K91840 Postprocedural hemorrhage and hematoma of a digestive system organ or structure following a digestive system procedure: Secondary | ICD-10-CM

## 2018-10-12 DIAGNOSIS — Z0000 Encounter for general adult medical examination without abnormal findings: Secondary | ICD-10-CM

## 2018-10-12 DIAGNOSIS — Z7689 Persons encountering health services in other specified circumstances: Secondary | ICD-10-CM

## 2018-10-12 DIAGNOSIS — Z09 Encounter for follow-up examination after completed treatment for conditions other than malignant neoplasm: Secondary | ICD-10-CM

## 2018-10-12 LAB — POCT URINALYSIS DIP (MANUAL ENTRY)
Bilirubin, UA: NEGATIVE
Blood, UA: NEGATIVE
Glucose, UA: NEGATIVE mg/dL
Ketones, POC UA: NEGATIVE mg/dL
Leukocytes, UA: NEGATIVE
Nitrite, UA: NEGATIVE
Protein Ur, POC: NEGATIVE mg/dL
Spec Grav, UA: 1.015 (ref 1.010–1.025)
Urobilinogen, UA: 0.2 E.U./dL
pH, UA: 7 (ref 5.0–8.0)

## 2018-10-12 MED ORDER — MECLIZINE HCL 25 MG PO TABS: 25.0000 mg | ORAL_TABLET | Freq: Three times a day (TID) | ORAL | 3 refills | Status: AC | PRN

## 2018-10-12 MED ORDER — PROMETHAZINE HCL 12.5 MG PO TABS: 12.5000 mg | ORAL_TABLET | Freq: Four times a day (QID) | ORAL | 1 refills | Status: DC | PRN

## 2018-10-12 MED ORDER — AMLODIPINE BESYLATE 10 MG PO TABS: 10.0000 mg | ORAL_TABLET | Freq: Every day | ORAL | 3 refills | Status: DC

## 2018-10-12 MED ORDER — HYDROXYCHLOROQUINE SULFATE 200 MG PO TABS: 200.0000 mg | ORAL_TABLET | Freq: Two times a day (BID) | ORAL | 3 refills | Status: DC

## 2018-10-12 MED ORDER — LOSARTAN POTASSIUM 50 MG PO TABS: 50.0000 mg | ORAL_TABLET | Freq: Every day | ORAL | 3 refills | Status: DC

## 2018-10-12 MED ORDER — CITALOPRAM HYDROBROMIDE 10 MG PO TABS: 10.0000 mg | ORAL_TABLET | Freq: Every day | ORAL | 3 refills | Status: DC

## 2018-10-12 MED ORDER — TIZANIDINE HCL 2 MG PO TABS: 2.0000 mg | ORAL_TABLET | Freq: Three times a day (TID) | ORAL | 2 refills | Status: DC | PRN

## 2018-10-12 NOTE — Progress Notes (Addendum)
[facility]  New Patient--Hospital Follow Up--Establish Care  Subjective:  Patient ID: Crystal Winters, female    DOB: 01-18-1971  Age: 48 y.o. MRN: [id number]  CC:  Chief Complaint  Patient presents with  . Establish Care  . Hospitalization Follow-up    HPI Crystal Winters is a 48 year old female who presents for Hospital Follow Up and to Establish care today.   Past Medical History:  Diagnosis Date  . Anemia   . Diverticulitis   . H/O total vaginal hysterectomy 20 years ago  . Hypertension   . Hypocalcemia   . Lupus (HCC)   . Nausea   . Vertigo    Current Status: Since her last Hospital Admission for GI bleed on 10/02/2018-10/05/2018, she is doing well with no complaints. She has history of Hysterectomy. She is due for Pap. She has history of Lupus and would like to be referred to Rheumatologist. She is recently relocated from [place], [place]. She is currently in applying disability, Medicaid is pending. No reports of GI problems such as vomiting, diarrhea, and constipation. She has no reports of dysuria and hematuria.   She denies fevers, chills, fatigue, recent infections, weight loss, and night sweats. She has not had any headaches, visual changes, dizziness, and falls. No chest pain, heart palpitations, cough and shortness of breath reported.  No depression or anxiety reported. She denies pain today.   Past Surgical History:  Procedure Laterality Date  . ABDOMINAL HYSTERECTOMY    . BREAST REDUCTION SURGERY    . CHOLECYSTECTOMY    . COLONOSCOPY WITH PROPOFOL N/A 09/27/2018   Procedure: COLONOSCOPY WITH PROPOFOL;  Surgeon: Graylin Shiver, MD;  Location: [facility];  Service: Endoscopy;  Laterality: N/A;  . FLEXIBLE SIGMOIDOSCOPY N/A 10/03/2018   Procedure: FLEXIBLE SIGMOIDOSCOPY;  Surgeon: Kathi Der, MD;  Location: [facility];  Service: Gastroenterology;  Laterality: N/A;  . TOE SURGERY Right   . TUBAL LIGATION       Family History  Problem Relation Age of Onset  . Rectal cancer Other   . Hypertension Other   . Diabetes Other   . Breast cancer Other   . Colon cancer Other   . Prostate cancer Other   . Stroke Other   . CAD Other   . Coronary artery disease Other     Social History   Socioeconomic History  . Marital status: Divorced    Spouse name: Not on file  . Number of children: Not on file  . Years of education: Not on file  . Highest education level: Not on file  Occupational History  . Not on file  Social Needs  . Financial resource strain: Not on file  . Food insecurity:    Worry: Not on file    Inability: Not on file  . Transportation needs:    Medical: Not on file    Non-medical: Not on file  Tobacco Use  . Smoking status: Never Smoker  . Smokeless tobacco: Never Used  Substance and Sexual Activity  . Alcohol use: Not Currently  . Drug use: Never  . Sexual activity: Not on file  Lifestyle  . Physical activity:    Days per week: Not on file    Minutes per session: Not on file  . Stress: Not on file  Relationships  . Social connections:    Talks on phone: Not on file    Gets together: Not on file    Attends religious  service: Not on file    Active member of club or organization: Not on file    Attends meetings of clubs or organizations: Not on file    Relationship status: Not on file  . Intimate partner violence:    Fear of current or ex partner: Not on file    Emotionally abused: Not on file    Physically abused: Not on file    Forced sexual activity: Not on file  Other Topics Concern  . Not on file  Social History Narrative  . Not on file    Outpatient Medications Prior to Visit  Medication Sig Dispense Refill  . Melatonin 3 MG CAPS Take 3 mg by mouth at bedtime.    Marland Kitchen oxyCODONE (OXY IR/ROXICODONE) 5 MG immediate release tablet Take 1 tablet (5 mg total) by mouth every 6 (six) hours as needed for moderate pain, severe pain or breakthrough pain. 30  tablet 0  . amLODipine (NORVASC) 10 MG tablet Take 10 mg by mouth daily.    . citalopram (CELEXA) 10 MG tablet Take 10 mg by mouth daily.    Marland Kitchen dimenhyDRINATE (DRAMAMINE) 50 MG tablet Take 50 mg by mouth daily as needed for nausea or dizziness.    . hydroxychloroquine (PLAQUENIL) 200 MG tablet Take 1 tablet (200 mg total) by mouth 2 (two) times daily. 60 tablet 0  . losartan (COZAAR) 50 MG tablet Take 50 mg by mouth daily.    . promethazine (PHENERGAN) 12.5 MG tablet Take 1 tablet (12.5 mg total) by mouth every 6 (six) hours as needed for nausea or vomiting. 30 tablet 0  . tiZANidine (ZANAFLEX) 2 MG tablet Take 2 mg by mouth every 8 (eight) hours as needed for muscle spasms.     No facility-administered medications prior to visit.     Allergies  Allergen Reactions  . Prochlorperazine Other (See Comments)    Muscle stiffness    ROS Review of Systems  Constitutional: Negative.   HENT: Negative.   Eyes: Negative.   Respiratory: Negative.   Cardiovascular: Negative.   Gastrointestinal: Positive for anal bleeding.  Endocrine: Negative.   Genitourinary: Negative.   Musculoskeletal: Negative.        Lupus  Skin: Negative.   Allergic/Immunologic: Negative.   Neurological: Negative.   Hematological: Negative.   Psychiatric/Behavioral: Negative.    Objective:    Physical Exam  Constitutional: She is oriented to person, place, and time. She appears well-developed and well-nourished.  HENT:  Head: Normocephalic and atraumatic.  Eyes: Conjunctivae are normal.  Neck: Normal range of motion. Neck supple.  Cardiovascular: Normal rate, regular rhythm, normal heart sounds and intact distal pulses.  Pulmonary/Chest: Effort normal and breath sounds normal.  Abdominal: Soft. Bowel sounds are normal.  Musculoskeletal: Normal range of motion.  Neurological: She is alert and oriented to person, place, and time. She has normal reflexes.  Skin: Skin is warm and dry.  Psychiatric: She has a  normal mood and affect. Her behavior is normal. Judgment and thought content normal.  Nursing note and vitals reviewed.   BP (!) 158/82 (BP Location: Left Arm, Patient Position: Sitting, Cuff Size: Large)   Pulse 75   Temp 98 F (36.7 C) (Oral)   Ht 6' (1.829 m)   Wt 207 lb (93.9 kg)   SpO2 100%   BMI 28.07 kg/m  Wt Readings from Last 3 Encounters:  10/12/18 207 lb (93.9 kg)  10/04/18 212 lb 9.6 oz (96.4 kg)  09/24/18 195 lb (88.5 kg)  Health Maintenance Due  Topic Date Due  . HIV Screening  09/26/1985  . PAP SMEAR-Modifier  09/27/1991    There are no preventive care reminders to display for this patient.  Lab Results  Component Value Date   TSH 1.375 09/25/2018   Lab Results  Component Value Date   WBC 5.8 10/05/2018   HGB 8.2 (L) 10/05/2018   HCT 26.9 (L) 10/05/2018   MCV 92.4 10/05/2018   PLT 185 10/05/2018   Lab Results  Component Value Date   NA 139 10/05/2018   K 3.5 10/05/2018   CO2 27 10/05/2018   GLUCOSE 94 10/05/2018   BUN 13 10/05/2018   CREATININE 1.05 (H) 10/05/2018   BILITOT 0.2 (L) 10/05/2018   ALKPHOS 56 10/05/2018   AST 14 (L) 10/05/2018   ALT 12 10/05/2018   PROT 5.7 (L) 10/05/2018   ALBUMIN 3.4 (L) 10/05/2018   CALCIUM 8.3 (L) 10/05/2018   ANIONGAP 4 (L) 10/05/2018   No results found for: CHOL No results found for: HDL No results found for: LDLCALC No results found for: TRIG No results found for: CHOLHDL No results found for: SHFW2O    Assessment & Plan:   1. Hospital discharge follow-up  2. Encounter to establish care - POCT urinalysis dipstick  3. Hemorrhage of colon following colonoscopy Mild anal bleeding.  - Ambulatory referral to Gastroenterology  4. Healthcare maintenance - CBC with Differential - Comprehensive metabolic panel - TSH - Lipid Panel - Vitamin B12 - Vitamin D, 25-hydroxy - amLODipine (NORVASC) 10 MG tablet; Take 1 tablet (10 mg total) by mouth daily.  Dispense: 30 tablet; Refill: 3 - citalopram  (CELEXA) 10 MG tablet; Take 1 tablet (10 mg total) by mouth daily.  Dispense: 30 tablet; Refill: 3 - hydroxychloroquine (PLAQUENIL) 200 MG tablet; Take 1 tablet (200 mg total) by mouth 2 (two) times daily.  Dispense: 60 tablet; Refill: 3 - losartan (COZAAR) 50 MG tablet; Take 1 tablet (50 mg total) by mouth daily.  Dispense: 30 tablet; Refill: 3 - promethazine (PHENERGAN) 12.5 MG tablet; Take 1 tablet (12.5 mg total) by mouth every 6 (six) hours as needed for nausea or vomiting.  Dispense: 30 tablet; Refill: 1 - tiZANidine (ZANAFLEX) 2 MG tablet; Take 1 tablet (2 mg total) by mouth every 8 (eight) hours as needed for muscle spasms.  Dispense: 30 tablet; Refill: 2 - meclizine (ANTIVERT) 25 MG tablet; Take 1 tablet (25 mg total) by mouth 3 (three) times daily as needed for dizziness.  Dispense: 30 tablet; Refill: 3  5. Follow up She will follow up in 1 month or Pap Smear.   Meds ordered this encounter  Medications  . amLODipine (NORVASC) 10 MG tablet    Sig: Take 1 tablet (10 mg total) by mouth daily.    Dispense:  30 tablet    Refill:  3  . citalopram (CELEXA) 10 MG tablet    Sig: Take 1 tablet (10 mg total) by mouth daily.    Dispense:  30 tablet    Refill:  3  . hydroxychloroquine (PLAQUENIL) 200 MG tablet    Sig: Take 1 tablet (200 mg total) by mouth 2 (two) times daily.    Dispense:  60 tablet    Refill:  3  . losartan (COZAAR) 50 MG tablet    Sig: Take 1 tablet (50 mg total) by mouth daily.    Dispense:  30 tablet    Refill:  3  . promethazine (PHENERGAN) 12.5 MG tablet  Sig: Take 1 tablet (12.5 mg total) by mouth every 6 (six) hours as needed for nausea or vomiting.    Dispense:  30 tablet    Refill:  1  . tiZANidine (ZANAFLEX) 2 MG tablet    Sig: Take 1 tablet (2 mg total) by mouth every 8 (eight) hours as needed for muscle spasms.    Dispense:  30 tablet    Refill:  2  . meclizine (ANTIVERT) 25 MG tablet    Sig: Take 1 tablet (25 mg total) by mouth 3 (three) times daily  as needed for dizziness.    Dispense:  30 tablet    Refill:  3    Orders Placed This Encounter  Procedures  . CBC with Differential  . Comprehensive metabolic panel  . TSH  . Lipid Panel  . Vitamin B12  . Vitamin D, 25-hydroxy  . Ambulatory referral to Gastroenterology  . POCT urinalysis dipstick     Referral Orders     Ambulatory referral to Gastroenterology   Raliegh Ip,  MSN, FNP-C [facility] [address] [place], [place] [address] [phone]   Problem List Items Addressed This Visit    None    Visit Diagnoses    Hospital discharge follow-up    -  Primary   Encounter to establish care       Relevant Orders   POCT urinalysis dipstick (Completed)   Hemorrhage of colon following colonoscopy       Relevant Orders   Ambulatory referral to Gastroenterology   Healthcare maintenance       Relevant Medications   amLODipine (NORVASC) 10 MG tablet   citalopram (CELEXA) 10 MG tablet   hydroxychloroquine (PLAQUENIL) 200 MG tablet   losartan (COZAAR) 50 MG tablet   promethazine (PHENERGAN) 12.5 MG tablet   tiZANidine (ZANAFLEX) 2 MG tablet   meclizine (ANTIVERT) 25 MG tablet   Other Relevant Orders   CBC with Differential   Comprehensive metabolic panel   TSH   Lipid Panel   Vitamin B12   Vitamin D, 25-hydroxy   Follow up          Meds ordered this encounter  Medications  . amLODipine (NORVASC) 10 MG tablet    Sig: Take 1 tablet (10 mg total) by mouth daily.    Dispense:  30 tablet    Refill:  3  . citalopram (CELEXA) 10 MG tablet    Sig: Take 1 tablet (10 mg total) by mouth daily.    Dispense:  30 tablet    Refill:  3  . hydroxychloroquine (PLAQUENIL) 200 MG tablet    Sig: Take 1 tablet (200 mg total) by mouth 2 (two) times daily.    Dispense:  60 tablet    Refill:  3  . losartan (COZAAR) 50 MG tablet    Sig: Take 1 tablet (50 mg total) by mouth daily.    Dispense:  30 tablet    Refill:  3  . promethazine  (PHENERGAN) 12.5 MG tablet    Sig: Take 1 tablet (12.5 mg total) by mouth every 6 (six) hours as needed for nausea or vomiting.    Dispense:  30 tablet    Refill:  1  . tiZANidine (ZANAFLEX) 2 MG tablet    Sig: Take 1 tablet (2 mg total) by mouth every 8 (eight) hours as needed for muscle spasms.    Dispense:  30 tablet    Refill:  2  . meclizine (  ANTIVERT) 25 MG tablet    Sig: Take 1 tablet (25 mg total) by mouth 3 (three) times daily as needed for dizziness.    Dispense:  30 tablet    Refill:  3    Follow-up: No follow-ups on file.    [name], FNP

## 2018-10-13 LAB — CBC WITH DIFFERENTIAL/PLATELET
Basophils Absolute: 0 10*3/uL (ref 0.0–0.2)
Basos: 1 %
EOS (ABSOLUTE): 0.1 10*3/uL (ref 0.0–0.4)
Eos: 1 %
Hematocrit: 33.7 % — ABNORMAL LOW (ref 34.0–46.6)
Hemoglobin: 11.2 g/dL (ref 11.1–15.9)
Immature Grans (Abs): 0 10*3/uL (ref 0.0–0.1)
Immature Granulocytes: 0 %
Lymphocytes Absolute: 1.6 10*3/uL (ref 0.7–3.1)
Lymphs: 24 %
MCH: 28.4 pg (ref 26.6–33.0)
MCHC: 33.2 g/dL (ref 31.5–35.7)
MCV: 86 fL (ref 79–97)
Monocytes Absolute: 0.3 10*3/uL (ref 0.1–0.9)
Monocytes: 4 %
Neutrophils Absolute: 4.7 10*3/uL (ref 1.4–7.0)
Neutrophils: 70 %
Platelets: 277 10*3/uL (ref 150–450)
RBC: 3.94 x10E6/uL (ref 3.77–5.28)
RDW: 12.9 % (ref 11.7–15.4)
WBC: 6.6 10*3/uL (ref 3.4–10.8)

## 2018-10-13 LAB — COMPREHENSIVE METABOLIC PANEL
ALT: 11 IU/L (ref 0–32)
AST: 16 IU/L (ref 0–40)
Albumin/Globulin Ratio: 1.8 (ref 1.2–2.2)
Albumin: 5.1 g/dL — ABNORMAL HIGH (ref 3.8–4.8)
Alkaline Phosphatase: 92 IU/L (ref 39–117)
BUN/Creatinine Ratio: 9 (ref 9–23)
BUN: 9 mg/dL (ref 6–24)
Bilirubin Total: 0.3 mg/dL (ref 0.0–1.2)
CO2: 20 mmol/L (ref 20–29)
Calcium: 9.6 mg/dL (ref 8.7–10.2)
Chloride: 100 mmol/L (ref 96–106)
Creatinine, Ser: 0.97 mg/dL (ref 0.57–1.00)
GFR calc Af Amer: 80 mL/min/{1.73_m2} (ref >59)
GFR calc non Af Amer: 69 mL/min/{1.73_m2} (ref >59)
Globulin, Total: 2.9 g/dL (ref 1.5–4.5)
Glucose: 72 mg/dL (ref 65–99)
Potassium: 4.1 mmol/L (ref 3.5–5.2)
Sodium: 136 mmol/L (ref 134–144)
Total Protein: 8 g/dL (ref 6.0–8.5)

## 2018-10-13 LAB — LIPID PANEL
Chol/HDL Ratio: 3.1 ratio (ref 0.0–4.4)
Cholesterol, Total: 226 mg/dL — ABNORMAL HIGH (ref 100–199)
HDL: 74 mg/dL (ref >39)
LDL Calculated: 119 mg/dL — ABNORMAL HIGH (ref 0–99)
Triglycerides: 167 mg/dL — ABNORMAL HIGH (ref 0–149)
VLDL Cholesterol Cal: 33 mg/dL (ref 5–40)

## 2018-10-13 LAB — VITAMIN B12: Vitamin B-12: 575 pg/mL (ref 232–1245)

## 2018-10-13 LAB — TSH: TSH: 1.19 u[IU]/mL (ref 0.450–4.500)

## 2018-10-13 LAB — VITAMIN D 25 HYDROXY (VIT D DEFICIENCY, FRACTURES): Vit D, 25-Hydroxy: 31.6 ng/mL (ref 30.0–100.0)

## 2018-10-18 ENCOUNTER — Telehealth: Payer: Self-pay

## 2018-11-07 ENCOUNTER — Encounter: Payer: Self-pay | Admitting: Family Medicine

## 2018-11-07 ENCOUNTER — Other Ambulatory Visit: Payer: Self-pay

## 2018-11-07 ENCOUNTER — Ambulatory Visit (INDEPENDENT_AMBULATORY_CARE_PROVIDER_SITE_OTHER): Payer: Self-pay | Admitting: Family Medicine

## 2018-11-07 DIAGNOSIS — K91840 Postprocedural hemorrhage and hematoma of a digestive system organ or structure following a digestive system procedure: Secondary | ICD-10-CM | POA: Insufficient documentation

## 2018-11-07 DIAGNOSIS — IMO0002 Reserved for concepts with insufficient information to code with codable children: Secondary | ICD-10-CM

## 2018-11-07 DIAGNOSIS — M329 Systemic lupus erythematosus, unspecified: Secondary | ICD-10-CM

## 2018-11-07 DIAGNOSIS — Z09 Encounter for follow-up examination after completed treatment for conditions other than malignant neoplasm: Secondary | ICD-10-CM

## 2018-11-07 DIAGNOSIS — G8929 Other chronic pain: Secondary | ICD-10-CM | POA: Insufficient documentation

## 2018-11-07 DIAGNOSIS — I10 Essential (primary) hypertension: Secondary | ICD-10-CM

## 2018-11-07 DIAGNOSIS — M5442 Lumbago with sciatica, left side: Secondary | ICD-10-CM

## 2018-11-07 HISTORY — DX: Postprocedural hemorrhage of a digestive system organ or structure following a digestive system procedure: K91.840

## 2018-11-07 MED ORDER — CYCLOBENZAPRINE HCL 10 MG PO TABS: 10.0000 mg | ORAL_TABLET | Freq: Three times a day (TID) | ORAL | 2 refills | Status: DC | PRN

## 2018-11-07 MED ORDER — GABAPENTIN 300 MG PO CAPS: 300.0000 mg | ORAL_CAPSULE | Freq: Three times a day (TID) | ORAL | 3 refills | Status: DC

## 2018-11-07 NOTE — Progress Notes (Signed)
Virtual Visit via Telephone Note  I connected with Crystal Winters on 11/07/18 at  3:20 PM EDT by telephone and verified that I am speaking with the correct person using two identifiers.   I discussed the limitations, risks, security and privacy concerns of performing an evaluation and management service by telephone and the availability of in person appointments. I also discussed with the patient that there may be a patient responsible charge related to this service. The patient expressed understanding and agreed to proceed.   History of Present Illness:  Past Medical History:  Diagnosis Date  . Anemia   . Back pain   . Diverticulitis   . H/O total vaginal hysterectomy 20 years ago  . Hypertension   . Hypocalcemia   . Lupus (HCC)   . Nausea   . Vertigo    Current Outpatient Medications on File Prior to Visit  Medication Sig Dispense Refill  . amLODipine (NORVASC) 10 MG tablet Take 1 tablet (10 mg total) by mouth daily. 30 tablet 3  . citalopram (CELEXA) 10 MG tablet Take 1 tablet (10 mg total) by mouth daily. 30 tablet 3  . hydroxychloroquine (PLAQUENIL) 200 MG tablet Take 1 tablet (200 mg total) by mouth 2 (two) times daily. 60 tablet 3  . losartan (COZAAR) 50 MG tablet Take 1 tablet (50 mg total) by mouth daily. 30 tablet 3  . meclizine (ANTIVERT) 25 MG tablet Take 1 tablet (25 mg total) by mouth 3 (three) times daily as needed for dizziness. 30 tablet 3  . Melatonin 3 MG CAPS Take 3 mg by mouth at bedtime.    . promethazine (PHENERGAN) 12.5 MG tablet Take 1 tablet (12.5 mg total) by mouth every 6 (six) hours as needed for nausea or vomiting. 30 tablet 1   No current facility-administered medications on file prior to visit.    Current Status: Since her last office visit, she has had followed up with GI. No reports of anal bleeding. No reports of GI problems such as nausea, vomiting, diarrhea, and constipation. She has no reports of blood in stools, dysuria and hematuria. She  states that her blood pressures are about the same at 150s/90's. She continues to take Amlodipine and Losartan as prescribed. She denies visual changes, chest pain, cough, shortness of breath, heart palpitations, and falls. She has occasional headaches and dizziness with position changes. Denies severe headaches, confusion, seizures, double vision, and blurred vision, nausea and vomiting. She has a history of Lupus and is doing well.  She has c/o of lower back pain and lower joint pain.   She denies fevers, chills, fatigue, recent infections, weight loss, and night sweats. She has not had any headaches, visual changes, dizziness, and falls. No chest pain, heart palpitations, cough and shortness of breath reported. No depression or anxiety reported today.   Observations/Objective:  Telephone Virtual Visit   Assessment and Plan:  1. Chronic bilateral low back pain with left-sided sciatica We will initiate muscle relaxant and medication for sciatica today.  - cyclobenzaprine (FLEXERIL) 10 MG tablet; Take 1 tablet (10 mg total) by mouth 3 (three) times daily as needed for muscle spasms.  Dispense: 30 tablet; Refill: 2 - gabapentin (NEURONTIN) 300 MG capsule; Take 1 capsule (300 mg total) by mouth 3 (three) times daily.  Dispense: 90 capsule; Refill: 3  2. Essential hypertension She records blood pressure at home. She states that readings are stable. She will continue Amlodipine and Losartan as prescribed. She will continue to decrease high sodium  intake, excessive alcohol intake, increase potassium intake, smoking cessation, and increase physical activity of at least 30 minutes of cardio activity daily. She will continue to follow Heart Healthy or DASH diet.  3. Lupus (HCC) Stable.   4. Hemorrhage of colon following colonoscopy Stable. No reports of recent anal bleeding.   Follow Up Instructions:  She will follow up in 3 months.     I discussed the assessment and treatment plan with the  patient. The patient was provided an opportunity to ask questions and all were answered. The patient agreed with the plan and demonstrated an understanding of the instructions.   The patient was advised to call back or seek an in-person evaluation if the symptoms worsen or if the condition fails to improve as anticipated.  I provided 15-20 minutes of non-face-to-face time during this encounter.   [name], FNP

## 2018-11-12 ENCOUNTER — Ambulatory Visit: Payer: Self-pay | Admitting: Family Medicine

## 2018-12-04 NOTE — Telephone Encounter (Signed)
Message sent to provider 

## 2018-12-25 ENCOUNTER — Ambulatory Visit (INDEPENDENT_AMBULATORY_CARE_PROVIDER_SITE_OTHER): Payer: Self-pay | Admitting: Family Medicine

## 2018-12-25 ENCOUNTER — Encounter: Payer: Self-pay | Admitting: Family Medicine

## 2018-12-25 ENCOUNTER — Other Ambulatory Visit: Payer: Self-pay

## 2018-12-25 VITALS — BP 144/82 | HR 98 | Temp 98.3°F | Ht 72.0 in | Wt 213.6 lb

## 2018-12-25 DIAGNOSIS — IMO0002 Reserved for concepts with insufficient information to code with codable children: Secondary | ICD-10-CM

## 2018-12-25 DIAGNOSIS — G8929 Other chronic pain: Secondary | ICD-10-CM

## 2018-12-25 DIAGNOSIS — Z09 Encounter for follow-up examination after completed treatment for conditions other than malignant neoplasm: Secondary | ICD-10-CM

## 2018-12-25 DIAGNOSIS — M329 Systemic lupus erythematosus, unspecified: Secondary | ICD-10-CM

## 2018-12-25 DIAGNOSIS — M5442 Lumbago with sciatica, left side: Secondary | ICD-10-CM

## 2018-12-25 DIAGNOSIS — I10 Essential (primary) hypertension: Secondary | ICD-10-CM

## 2018-12-25 HISTORY — DX: Systemic lupus erythematosus, unspecified: M32.9

## 2018-12-25 LAB — POCT URINALYSIS DIP (MANUAL ENTRY)
Bilirubin, UA: NEGATIVE
Blood, UA: NEGATIVE
Glucose, UA: NEGATIVE mg/dL
Leukocytes, UA: NEGATIVE
Nitrite, UA: NEGATIVE
Spec Grav, UA: 1.02 (ref 1.010–1.025)
Urobilinogen, UA: 0.2 E.U./dL
pH, UA: 6 (ref 5.0–8.0)

## 2018-12-25 MED ORDER — PREDNISONE 10 MG PO TABS: ORAL_TABLET | ORAL | 0 refills | Status: DC

## 2018-12-25 MED ORDER — METHYLPREDNISOLONE SODIUM SUCC 125 MG IJ SOLR
125.0000 mg | Freq: Once | INTRAMUSCULAR | Status: AC
  Administered 2018-12-25: 125 mg via INTRAMUSCULAR

## 2018-12-25 MED ORDER — CYCLOBENZAPRINE HCL 10 MG PO TABS: 10.0000 mg | ORAL_TABLET | Freq: Three times a day (TID) | ORAL | 3 refills | Status: DC | PRN

## 2018-12-25 NOTE — Patient Instructions (Signed)
Prednisone tablets What is this medicine? PREDNISONE (PRED ni sone) is a corticosteroid. It is commonly used to treat inflammation of the skin, joints, lungs, and other organs. Common conditions treated include asthma, allergies, and arthritis. It is also used for other conditions, such as blood disorders and diseases of the adrenal glands. This medicine may be used for other purposes; ask your health care provider or pharmacist if you have questions. COMMON BRAND NAME(S): Deltasone, Predone, Sterapred, Sterapred DS What should I tell my health care provider before I take this medicine? They need to know if you have any of these conditions: -Cushing's syndrome -diabetes -glaucoma -heart disease -high blood pressure -infection (especially a virus infection such as chickenpox, cold sores, or herpes) -kidney disease -liver disease -mental illness -myasthenia gravis -osteoporosis -seizures -stomach or intestine problems -thyroid disease -an unusual or allergic reaction to lactose, prednisone, other medicines, foods, dyes, or preservatives -pregnant or trying to get pregnant -breast-feeding How should I use this medicine? Take this medicine by mouth with a glass of water. Follow the directions on the prescription label. Take this medicine with food. If you are taking this medicine once a day, take it in the morning. Do not take more medicine than you are told to take. Do not suddenly stop taking your medicine because you may develop a severe reaction. Your doctor will tell you how much medicine to take. If your doctor wants you to stop the medicine, the dose may be slowly lowered over time to avoid any side effects. Talk to your pediatrician regarding the use of this medicine in children. Special care may be needed. Overdosage: If you think you have taken too much of this medicine contact a poison control center or emergency room at once. NOTE: This medicine is only for you. Do not share this  medicine with others. What if I miss a dose? If you miss a dose, take it as soon as you can. If it is almost time for your next dose, talk to your doctor or health care professional. You may need to miss a dose or take an extra dose. Do not take double or extra doses without advice. What may interact with this medicine? Do not take this medicine with any of the following medications: -metyrapone -mifepristone This medicine may also interact with the following medications: -aminoglutethimide -amphotericin B -aspirin and aspirin-like medicines -barbiturates -certain medicines for diabetes, like glipizide or glyburide -cholestyramine -cholinesterase inhibitors -cyclosporine -digoxin -diuretics -ephedrine -female hormones, like estrogens and birth control pills -isoniazid -ketoconazole -NSAIDS, medicines for pain and inflammation, like ibuprofen or naproxen -phenytoin -rifampin -toxoids -vaccines -warfarin This list may not describe all possible interactions. Give your health care provider a list of all the medicines, herbs, non-prescription drugs, or dietary supplements you use. Also tell them if you smoke, drink alcohol, or use illegal drugs. Some items may interact with your medicine. What should I watch for while using this medicine? Visit your doctor or health care professional for regular checks on your progress. If you are taking this medicine over a prolonged period, carry an identification card with your name and address, the type and dose of your medicine, and your doctor's name and address. This medicine may increase your risk of getting an infection. Tell your doctor or health care professional if you are around anyone with measles or chickenpox, or if you develop sores or blisters that do not heal properly. If you are going to have surgery, tell your doctor or health care professional that  you have taken this medicine within the last twelve months. Ask your doctor or health  care professional about your diet. You may need to lower the amount of salt you eat. This medicine may increase blood sugar. Ask your healthcare provider if changes in diet or medicines are needed if you have diabetes. What side effects may I notice from receiving this medicine? Side effects that you should report to your doctor or health care professional as soon as possible: -allergic reactions like skin rash, itching or hives, swelling of the face, lips, or tongue -changes in emotions or moods -changes in vision -depressed mood -eye pain -fever or chills, cough, sore throat, pain or difficulty passing urine -signs and symptoms of high blood sugar such as being more thirsty or hungry or having to urinate more than normal. You may also feel very tired or have blurry vision. -swelling of ankles, feet Side effects that usually do not require medical attention (report to your doctor or health care professional if they continue or are bothersome): -confusion, excitement, restlessness -headache -nausea, vomiting -skin problems, acne, thin and shiny skin -trouble sleeping -weight gain This list may not describe all possible side effects. Call your doctor for medical advice about side effects. You may report side effects to FDA at 1-800-FDA-1088. Where should I keep my medicine? Keep out of the reach of children. Store at room temperature between 15 and 30 degrees C (59 and 86 degrees F). Protect from light. Keep container tightly closed. Throw away any unused medicine after the expiration date. NOTE: This sheet is a summary. It may not cover all possible information. If you have questions about this medicine, talk to your doctor, pharmacist, or health care provider.  2019 [employer] (2018-04-24 10:54:22) Methotrexate injection What is this medicine? METHOTREXATE (METH oh TREX ate) is a chemotherapy drug used to treat cancer including breast cancer, leukemia, and lymphoma. This  medicine can also be used to treat psoriasis and certain kinds of arthritis. This medicine may be used for other purposes; ask your health care provider or pharmacist if you have questions. What should I tell my health care provider before I take this medicine? They need to know if you have any of these conditions: -fluid in the stomach area or lungs -if you often drink alcohol -infection or immune system problems -kidney disease -liver disease -low blood counts, like low white cell, platelet, or red cell counts -lung disease -radiation therapy -stomach ulcers -ulcerative colitis -an unusual or allergic reaction to methotrexate, other medicines, foods, dyes, or preservatives -pregnant or trying to get pregnant -breast-feeding How should I use this medicine? This medicine is for infusion into a vein or for injection into muscle or into the spinal fluid (whichever applies). It is usually given by a health care professional in a hospital or clinic setting. In rare cases, you might get this medicine at home. You will be taught how to give this medicine. Use exactly as directed. Take your medicine at regular intervals. Do not take your medicine more often than directed. If this medicine is used for arthritis or psoriasis, it should be taken weekly, NOT daily. It is important that you put your used needles and syringes in a special sharps container. Do not put them in a trash can. If you do not have a sharps container, call your pharmacist or healthcare provider to get one. Talk to your pediatrician regarding the use of this medicine in children. While this drug may be prescribed for children  as young as 2 years for selected conditions, precautions do apply. Overdosage: If you think you have taken too much of this medicine contact a poison control center or emergency room at once. NOTE: This medicine is only for you. Do not share this medicine with others. What if I miss a dose? It is important  not to miss your dose. Call your doctor or health care professional if you are unable to keep an appointment. If you give yourself the medicine and you miss a dose, talk with your doctor or health care professional. Do not take double or extra doses. What may interact with this medicine? This medicine may interact with the following medications: -acitretin -aspirin or aspirin-like medicines including salicylates -azathioprine -certain antibiotics like chloramphenicol, penicillin, tetracycline -certain medicines for stomach problems like esomeprazole, omeprazole, pantoprazole -cyclosporine -gold -hydroxychloroquine -live virus vaccines -mercaptopurine -NSAIDs, medicines for pain and inflammation, like ibuprofen or naproxen -other cytotoxic agents -penicillamine -phenylbutazone -phenytoin -probenacid -retinoids such as isotretinoin and tretinoin -steroid medicines like prednisone or cortisone -sulfonamides like sulfasalazine and trimethoprim/sulfamethoxazole -theophylline This list may not describe all possible interactions. Give your health care provider a list of all the medicines, herbs, non-prescription drugs, or dietary supplements you use. Also tell them if you smoke, drink alcohol, or use illegal drugs. Some items may interact with your medicine. What should I watch for while using this medicine? Avoid alcoholic drinks. In some cases, you may be given additional medicines to help with side effects. Follow all directions for their use. This medicine can make you more sensitive to the sun. Keep out of the sun. If you cannot avoid being in the sun, wear protective clothing and use sunscreen. Do not use sun lamps or tanning beds/booths. You may get drowsy or dizzy. Do not drive, use machinery, or do anything that needs mental alertness until you know how this medicine affects you. Do not stand or sit up quickly, especially if you are an older patient. This reduces the risk of dizzy or  fainting spells. You may need blood work done while you are taking this medicine. Call your doctor or health care professional for advice if you get a fever, chills or sore throat, or other symptoms of a cold or flu. Do not treat yourself. This drug decreases your body's ability to fight infections. Try to avoid being around people who are sick. This medicine may increase your risk to bruise or bleed. Call your doctor or health care professional if you notice any unusual bleeding. Check with your doctor or health care professional if you get an attack of severe diarrhea, nausea and vomiting, or if you sweat a lot. The loss of too much body fluid can make it dangerous for you to take this medicine. Talk to your doctor about your risk of cancer. You may be more at risk for certain types of cancers if you take this medicine. Both men and women must use effective birth control with this medicine. Do not become pregnant while taking this medicine or until at least 1 normal menstrual cycle has occurred after stopping it. Women should inform their doctor if they wish to become pregnant or think they might be pregnant. Men should not father a child while taking this medicine and for 3 months after stopping it. There is a potential for serious side effects to an unborn child. Talk to your health care professional or pharmacist for more information. Do not breast-feed an infant while taking this medicine. What side effects may  I notice from receiving this medicine? Side effects that you should report to your doctor or health care professional as soon as possible: -allergic reactions like skin rash, itching or hives, swelling of the face, lips, or tongue -back pain -breathing problems or shortness of breath -confusion -diarrhea -dry, nonproductive cough -low blood counts - this medicine may decrease the number of white blood cells, red blood cells and platelets. You may be at increased risk of infections and  bleeding -mouth sores -redness, blistering, peeling or loosening of the skin, including inside the mouth -seizures -severe headaches -signs of infection - fever or chills, cough, sore throat, pain or difficulty passing urine -signs and symptoms of bleeding such as bloody or black, tarry stools; red or dark-brown urine; spitting up blood or brown material that looks like coffee grounds; red spots on the skin; unusual bruising or bleeding from the eye, gums, or nose -signs and symptoms of kidney injury like trouble passing urine or change in the amount of urine -signs and symptoms of liver injury like dark yellow or brown urine; general ill feeling or flu-like symptoms; light-colored stools; loss of appetite; nausea; right upper belly pain; unusually weak or tired; yellowing of the eyes or skin -stiff neck -vomiting Side effects that usually do not require medical attention (report to your doctor or health care professional if they continue or are bothersome): -dizziness -hair loss -headache -stomach pain -upset stomach This list may not describe all possible side effects. Call your doctor for medical advice about side effects. You may report side effects to FDA at 1-800-FDA-1088. Where should I keep my medicine? If you are using this medicine at home, you will be instructed on how to store this medicine. Throw away any unused medicine after the expiration date on the label. NOTE: This sheet is a summary. It may not cover all possible information. If you have questions about this medicine, talk to your doctor, pharmacist, or health care provider.  2019 [employer] (2017-03-16 13:31:42)

## 2018-12-25 NOTE — Progress Notes (Signed)
[facility]   Sick Visit  Subjective:  Patient ID: Crystal Winters, female    DOB: 1971-05-14  Age: 48 y.o. MRN: [id number]  CC:  Chief Complaint  Patient presents with  . Back Pain  . Joint pain    HPI Crystal Winters is a 48 year old female who presents for Sick Visit today.   Past Medical History:  Diagnosis Date  . Anemia   . Back pain   . Diverticulitis   . H/O total vaginal hysterectomy 20 years ago  . Hypertension   . Hypocalcemia   . Lupus (HCC)   . Nausea   . Vertigo    Current Status: Since her last office visit, she is doing well with no complaints. She has been having increasing joint pain, swollen shoulders and lower extremities almost 2 weeks. She has been taking Gabapentin, Flexeril, and ice, and heating pads as prescribed with no relief. She states that she believes that this is r/t a Lupus flare-up. She has not followed up with Rheumatologist as of yet. She denies visual changes, chest pain, cough, shortness of breath, heart palpitations, and falls. She has occasional headaches and dizziness with position changes. Denies severe headaches, confusion, seizures, double vision, and blurred vision, nausea and vomiting.  She denies fevers, chills, fatigue, recent infections, weight loss, and night sweats. No reports of GI problems such as diarrhea, and constipation. She has no reports of blood in stools, dysuria and hematuria. No depression or anxiety reported.   Past Surgical History:  Procedure Laterality Date  . ABDOMINAL HYSTERECTOMY    . BREAST REDUCTION SURGERY    . CHOLECYSTECTOMY    . COLONOSCOPY WITH PROPOFOL N/A 09/27/2018   Procedure: COLONOSCOPY WITH PROPOFOL;  Surgeon: Graylin Shiver, MD;  Location: [facility];  Service: Endoscopy;  Laterality: N/A;  . FLEXIBLE SIGMOIDOSCOPY N/A 10/03/2018   Procedure: FLEXIBLE SIGMOIDOSCOPY;  Surgeon: Kathi Der, MD;  Location: [facility];  Service:  Gastroenterology;  Laterality: N/A;  . TOE SURGERY Right   . TUBAL LIGATION      Family History  Problem Relation Age of Onset  . Rectal cancer Other   . Hypertension Other   . Diabetes Other   . Breast cancer Other   . Colon cancer Other   . Prostate cancer Other   . Stroke Other   . CAD Other   . Coronary artery disease Other     Social History   Socioeconomic History  . Marital status: Divorced    Spouse name: Not on file  . Number of children: Not on file  . Years of education: Not on file  . Highest education level: Not on file  Occupational History  . Not on file  Social Needs  . Financial resource strain: Not on file  . Food insecurity:    Worry: Not on file    Inability: Not on file  . Transportation needs:    Medical: Not on file    Non-medical: Not on file  Tobacco Use  . Smoking status: Never Smoker  . Smokeless tobacco: Never Used  Substance and Sexual Activity  . Alcohol use: Not Currently  . Drug use: Never  . Sexual activity: Not on file  Lifestyle  . Physical activity:    Days per week: Not on file    Minutes per session: Not on file  . Stress: Not on file  Relationships  . Social connections:    Talks on phone:  Not on file    Gets together: Not on file    Attends religious service: Not on file    Active member of club or organization: Not on file    Attends meetings of clubs or organizations: Not on file    Relationship status: Not on file  . Intimate partner violence:    Fear of current or ex partner: Not on file    Emotionally abused: Not on file    Physically abused: Not on file    Forced sexual activity: Not on file  Other Topics Concern  . Not on file  Social History Narrative  . Not on file    Outpatient Medications Prior to Visit  Medication Sig Dispense Refill  . amLODipine (NORVASC) 10 MG tablet Take 1 tablet (10 mg total) by mouth daily. 30 tablet 3  . citalopram (CELEXA) 10 MG tablet Take 1 tablet (10 mg total) by mouth  daily. 30 tablet 3  . cyclobenzaprine (FLEXERIL) 10 MG tablet Take 1 tablet (10 mg total) by mouth 3 (three) times daily as needed for muscle spasms. 30 tablet 2  . gabapentin (NEURONTIN) 300 MG capsule Take 1 capsule (300 mg total) by mouth 3 (three) times daily. 90 capsule 3  . hydroxychloroquine (PLAQUENIL) 200 MG tablet Take 1 tablet (200 mg total) by mouth 2 (two) times daily. 60 tablet 3  . losartan (COZAAR) 50 MG tablet Take 1 tablet (50 mg total) by mouth daily. 30 tablet 3  . meclizine (ANTIVERT) 25 MG tablet Take 1 tablet (25 mg total) by mouth 3 (three) times daily as needed for dizziness. 30 tablet 3  . Melatonin 3 MG CAPS Take 3 mg by mouth at bedtime.    . promethazine (PHENERGAN) 12.5 MG tablet Take 1 tablet (12.5 mg total) by mouth every 6 (six) hours as needed for nausea or vomiting. 30 tablet 1   No facility-administered medications prior to visit.     Allergies  Allergen Reactions  . Prochlorperazine Other (See Comments)    Muscle stiffness    ROS Review of Systems  Constitutional: Negative.   HENT: Negative.   Eyes: Negative.   Respiratory: Negative.   Cardiovascular: Negative.   Gastrointestinal: Negative.   Endocrine: Negative.   Genitourinary: Negative.   Musculoskeletal: Positive for joint swelling and myalgias (shoulders, ankles. ).  Skin: Negative.   Neurological: Positive for dizziness and headaches.  Hematological: Negative.   Psychiatric/Behavioral: Negative.       Objective:    Physical Exam  Constitutional: She is oriented to person, place, and time. She appears well-developed and well-nourished.  HENT:  Head: Normocephalic and atraumatic.  Eyes: Conjunctivae are normal.  Neck: Normal range of motion. Neck supple.  Cardiovascular: Normal rate, regular rhythm, normal heart sounds and intact distal pulses.  Pulmonary/Chest: Effort normal and breath sounds normal.  Abdominal: Soft. Bowel sounds are normal.  Musculoskeletal:        General:  Tenderness and edema present.     Comments: Swollen joints.   Neurological: She is alert and oriented to person, place, and time. She has normal reflexes.  Skin: Skin is warm and dry.  Psychiatric: She has a normal mood and affect. Her behavior is normal. Thought content normal.  Nursing note and vitals reviewed.   BP (!) 144/82 (BP Location: Right Arm, Patient Position: Sitting, Cuff Size: Large)   Pulse 98   Temp 98.3 F (36.8 C) (Oral)   Ht 6' (1.829 m)   Wt 213 lb 9.6  oz (96.9 kg)   SpO2 97%   BMI 28.97 kg/m  Wt Readings from Last 3 Encounters:  12/25/18 213 lb 9.6 oz (96.9 kg)  10/12/18 207 lb (93.9 kg)  10/04/18 212 lb 9.6 oz (96.4 kg)     Health Maintenance Due  Topic Date Due  . PAP SMEAR-Modifier  09/27/1991    There are no preventive care reminders to display for this patient.  Lab Results  Component Value Date   TSH 1.190 10/12/2018   Lab Results  Component Value Date   WBC 6.6 10/12/2018   HGB 11.2 10/12/2018   HCT 33.7 (L) 10/12/2018   MCV 86 10/12/2018   PLT 277 10/12/2018   Lab Results  Component Value Date   NA 136 10/12/2018   K 4.1 10/12/2018   CO2 20 10/12/2018   GLUCOSE 72 10/12/2018   BUN 9 10/12/2018   CREATININE 0.97 10/12/2018   BILITOT 0.3 10/12/2018   ALKPHOS 92 10/12/2018   AST 16 10/12/2018   ALT 11 10/12/2018   PROT 8.0 10/12/2018   ALBUMIN 5.1 (H) 10/12/2018   CALCIUM 9.6 10/12/2018   ANIONGAP 4 (L) 10/05/2018   Lab Results  Component Value Date   CHOL 226 (H) 10/12/2018   Lab Results  Component Value Date   HDL 74 10/12/2018   Lab Results  Component Value Date   LDLCALC 119 (H) 10/12/2018   Lab Results  Component Value Date   TRIG 167 (H) 10/12/2018   Lab Results  Component Value Date   CHOLHDL 3.1 10/12/2018   No results found for: HGBA1C   Assessment & Plan:   1. Exacerbation of systemic lupus (HCC) Swollen, tender joints in multiple sites today.  - methylPREDNISolone sodium succinate (SOLU-MEDROL)  125 mg/2 mL injection 125 mg - predniSONE (DELTASONE) 10 MG tablet; Day #1:Take 6 tablets.  Day #2:Take 5 tablets. Day #3:Take 4 tablets. Day #4:Take 3 tablets. Day #5:Take 2 tablets. Day #6:Take 1 tablet, then complete.  Dispense: 21 tablet; Refill: 0  2. Lupus (HCC) We will initiate Prednisone Taper today. She received injection in office today.  - Ambulatory referral to Rheumatology - methylPREDNISolone sodium succinate (SOLU-MEDROL) 125 mg/2 mL injection 125 mg - predniSONE (DELTASONE) 10 MG tablet; Day #1:Take 6 tablets.  Day #2:Take 5 tablets. Day #3:Take 4 tablets. Day #4:Take 3 tablets. Day #5:Take 2 tablets. Day #6:Take 1 tablet, then complete.  Dispense: 21 tablet; Refill: 0  3. Chronic bilateral low back pain with left-sided sciatica We will refill Flexeril today.  - POCT urinalysis dipstick  4. Essential hypertension The current medical regimen is effective; blood pressure is stable at 144/82 today; continue present plan and medications. She will continue to decrease high sodium intake, excessive alcohol intake, increase potassium intake, smoking cessation, and increase physical activity of at least 30 minutes of cardio activity daily. She will continue to follow Heart Healthy or DASH diet.  5. Follow up She will keep previously scheduled follow up appointment.   Meds ordered this encounter  Medications  . methylPREDNISolone sodium succinate (SOLU-MEDROL) 125 mg/2 mL injection 125 mg  . predniSONE (DELTASONE) 10 MG tablet    Sig: Day #1:Take 6 tablets.  Day #2:Take 5 tablets. Day #3:Take 4 tablets. Day #4:Take 3 tablets. Day #5:Take 2 tablets. Day #6:Take 1 tablet, then complete.    Dispense:  21 tablet    Refill:  0    Orders Placed This Encounter  Procedures  . Ambulatory referral to Rheumatology  . POCT urinalysis  dipstick     Referral Orders     Ambulatory referral to Rheumatology   Raliegh Ip,  MSN, FNP-C [facility] [address] [place], [place] [address] [phone]   Problem List Items Addressed This Visit      Cardiovascular and Mediastinum   Essential hypertension     Nervous and Auditory   Chronic bilateral low back pain with left-sided sciatica   Relevant Medications   methylPREDNISolone sodium succinate (SOLU-MEDROL) 125 mg/2 mL injection 125 mg (Completed)   predniSONE (DELTASONE) 10 MG tablet   Other Relevant Orders   POCT urinalysis dipstick (Completed)     Other   Lupus (HCC)   Relevant Medications   methylPREDNISolone sodium succinate (SOLU-MEDROL) 125 mg/2 mL injection 125 mg (Completed)   predniSONE (DELTASONE) 10 MG tablet   Other Relevant Orders   Ambulatory referral to Rheumatology    Other Visit Diagnoses    Exacerbation of systemic lupus (HCC)    -  Primary   Relevant Medications   methylPREDNISolone sodium succinate (SOLU-MEDROL) 125 mg/2 mL injection 125 mg (Completed)   predniSONE (DELTASONE) 10 MG tablet   Follow up          Meds ordered this encounter  Medications  . methylPREDNISolone sodium succinate (SOLU-MEDROL) 125 mg/2 mL injection 125 mg  . predniSONE (DELTASONE) 10 MG tablet    Sig: Day #1:Take 6 tablets.  Day #2:Take 5 tablets. Day #3:Take 4 tablets. Day #4:Take 3 tablets. Day #5:Take 2 tablets. Day #6:Take 1 tablet, then complete.    Dispense:  21 tablet    Refill:  0    Follow-up: No follow-ups on file.    [name], FNP

## 2019-01-24 ENCOUNTER — Telehealth: Payer: Self-pay

## 2019-01-24 NOTE — Telephone Encounter (Signed)
Left a vm for patient to callback 

## 2019-01-25 ENCOUNTER — Ambulatory Visit: Payer: Self-pay | Admitting: Family Medicine

## 2019-02-11 ENCOUNTER — Other Ambulatory Visit: Payer: Self-pay | Admitting: Family Medicine

## 2019-02-11 ENCOUNTER — Ambulatory Visit: Payer: Self-pay | Admitting: Family Medicine

## 2019-02-11 DIAGNOSIS — Z0000 Encounter for general adult medical examination without abnormal findings: Secondary | ICD-10-CM

## 2019-02-19 ENCOUNTER — Other Ambulatory Visit: Payer: Self-pay

## 2019-02-19 ENCOUNTER — Ambulatory Visit (INDEPENDENT_AMBULATORY_CARE_PROVIDER_SITE_OTHER): Payer: Self-pay | Admitting: Family Medicine

## 2019-02-19 ENCOUNTER — Other Ambulatory Visit: Payer: Self-pay | Admitting: Family Medicine

## 2019-02-19 ENCOUNTER — Encounter: Payer: Self-pay | Admitting: Family Medicine

## 2019-02-19 VITALS — BP 140/86 | HR 92 | Temp 98.6°F | Ht 72.0 in | Wt 210.4 lb

## 2019-02-19 DIAGNOSIS — F419 Anxiety disorder, unspecified: Secondary | ICD-10-CM

## 2019-02-19 DIAGNOSIS — G4700 Insomnia, unspecified: Secondary | ICD-10-CM | POA: Insufficient documentation

## 2019-02-19 DIAGNOSIS — N644 Mastodynia: Secondary | ICD-10-CM

## 2019-02-19 DIAGNOSIS — Z09 Encounter for follow-up examination after completed treatment for conditions other than malignant neoplasm: Secondary | ICD-10-CM

## 2019-02-19 DIAGNOSIS — I10 Essential (primary) hypertension: Secondary | ICD-10-CM | POA: Insufficient documentation

## 2019-02-19 HISTORY — DX: Essential (primary) hypertension: I10

## 2019-02-19 HISTORY — DX: Anxiety disorder, unspecified: F41.9

## 2019-02-19 LAB — POCT URINALYSIS DIP (MANUAL ENTRY)
Bilirubin, UA: NEGATIVE
Blood, UA: NEGATIVE
Glucose, UA: NEGATIVE mg/dL
Ketones, POC UA: NEGATIVE mg/dL
Leukocytes, UA: NEGATIVE
Nitrite, UA: NEGATIVE
Protein Ur, POC: 100 mg/dL — AB
Spec Grav, UA: 1.025 (ref 1.010–1.025)
Urobilinogen, UA: 0.2 E.U./dL
pH, UA: 6 (ref 5.0–8.0)

## 2019-02-19 MED ORDER — TRAZODONE HCL 50 MG PO TABS: 50.0000 mg | ORAL_TABLET | Freq: Every evening | ORAL | 2 refills | Status: DC | PRN

## 2019-02-19 MED ORDER — CLONIDINE HCL 0.1 MG PO TABS
0.1000 mg | ORAL_TABLET | Freq: Once | ORAL | Status: AC
  Administered 2019-02-19: 0.1 mg via ORAL

## 2019-02-19 NOTE — Progress Notes (Signed)
[facility]   Established Patient Office Visit  Subjective:  Patient ID: Crystal Winters, female    DOB: [birth year]  Age: 48 y.o. MRN: [id number]  CC:  Chief Complaint  Patient presents with  . Follow-up    Chronic condition   . Shortness of Breath  . Arm Pain    under arm pit    HPI Crystal Winters is a 48 year old female who presents for follow up today.   Past Medical History:  Diagnosis Date  . Anemia   . Back pain   . Diverticulitis   . H/O total vaginal hysterectomy 20 years ago  . Hypertension   . Hypocalcemia   . Lupus (HCC)   . Nausea   . Vertigo    Current Status: Since her last office visit, she is doing well with no complaints. She has occasional shortness of breath, r/t her anxiety. She states that her home blood pressure readings have been elevated at 165/112, and 174/120. She denies visual changes, chest pain, cough, heart palpitations, and falls. She has occasional headaches and dizziness with position changes. Denies severe headaches, confusion, seizures, double vision, and blurred vision, nausea and vomiting. She denies fevers, chills, fatigue, recent infections, weight loss, and night sweats. No reports of GI problems such as diarrhea, and constipation. She has no reports of blood in stools, dysuria and hematuria. Her anxiety has been increase lately, which she r/t her increased blood pressure readings tooday. She reports outer left breast/axilla pain and tenderness X 1 week, which is also painful when she wears her bra. She denies any injury or trauma to the area. She is unsure of her last Mammogram.   Past Surgical History:  Procedure Laterality Date  . ABDOMINAL HYSTERECTOMY    . BREAST REDUCTION SURGERY    . CHOLECYSTECTOMY    . COLONOSCOPY WITH PROPOFOL N/A 09/27/2018   Procedure: COLONOSCOPY WITH PROPOFOL;  Surgeon: Graylin ShiverGanem, Salem F, MD;  Location: [facility];  Service: Endoscopy;  Laterality: N/A;  .  FLEXIBLE SIGMOIDOSCOPY N/A 10/03/2018   Procedure: FLEXIBLE SIGMOIDOSCOPY;  Surgeon: Kathi DerBrahmbhatt, Parag, MD;  Location: [facility];  Service: Gastroenterology;  Laterality: N/A;  . TOE SURGERY Right   . TUBAL LIGATION      Family History  Problem Relation Age of Onset  . Rectal cancer Other   . Hypertension Other   . Diabetes Other   . Breast cancer Other   . Colon cancer Other   . Prostate cancer Other   . Stroke Other   . CAD Other   . Coronary artery disease Other     Social History   Socioeconomic History  . Marital status: Divorced    Spouse name: Not on file  . Number of children: Not on file  . Years of education: Not on file  . Highest education level: Not on file  Occupational History  . Not on file  Social Needs  . Financial resource strain: Not on file  . Food insecurity    Worry: Not on file    Inability: Not on file  . Transportation needs    Medical: Not on file    Non-medical: Not on file  Tobacco Use  . Smoking status: Never Smoker  . Smokeless tobacco: Never Used  Substance and Sexual Activity  . Alcohol use: Not Currently  . Drug use: Never  . Sexual activity: Not on file  Lifestyle  . Physical activity    Days  per week: Not on file    Minutes per session: Not on file  . Stress: Not on file  Relationships  . Social Herbalist on phone: Not on file    Gets together: Not on file    Attends religious service: Not on file    Active member of club or organization: Not on file    Attends meetings of clubs or organizations: Not on file    Relationship status: Not on file  . Intimate partner violence    Fear of current or ex partner: Not on file    Emotionally abused: Not on file    Physically abused: Not on file    Forced sexual activity: Not on file  Other Topics Concern  . Not on file  Social History Narrative  . Not on file    Outpatient Medications Prior to Visit  Medication Sig Dispense Refill  . amLODipine (NORVASC) 10  MG tablet Take 1 tablet (10 mg total) by mouth daily. 30 tablet 3  . citalopram (CELEXA) 10 MG tablet Take 1 tablet (10 mg total) by mouth daily. 30 tablet 3  . cyclobenzaprine (FLEXERIL) 10 MG tablet Take 1 tablet (10 mg total) by mouth 3 (three) times daily as needed for muscle spasms. 30 tablet 3  . gabapentin (NEURONTIN) 300 MG capsule Take 1 capsule (300 mg total) by mouth 3 (three) times daily. 90 capsule 3  . hydroxychloroquine (PLAQUENIL) 200 MG tablet Take 1 tablet (200 mg total) by mouth 2 (two) times daily. 60 tablet 3  . losartan (COZAAR) 50 MG tablet Take 1 tablet (50 mg total) by mouth daily. 30 tablet 3  . meclizine (ANTIVERT) 25 MG tablet Take 1 tablet (25 mg total) by mouth 3 (three) times daily as needed for dizziness. 30 tablet 3  . Melatonin 3 MG CAPS Take 3 mg by mouth at bedtime.    . promethazine (PHENERGAN) 12.5 MG tablet Take 1 tablet (12.5 mg total) by mouth every 6 (six) hours as needed for nausea or vomiting. (Patient not taking: Reported on 02/19/2019) 30 tablet 1  . predniSONE (DELTASONE) 10 MG tablet Day #1:Take 6 tablets.  Day #2:Take 5 tablets. Day #3:Take 4 tablets. Day #4:Take 3 tablets. Day #5:Take 2 tablets. Day #6:Take 1 tablet, then complete. (Patient not taking: Reported on 02/19/2019) 21 tablet 0   No facility-administered medications prior to visit.     Allergies  Allergen Reactions  . Prochlorperazine Other (See Comments)    Muscle stiffness    ROS Review of Systems  Constitutional: Negative.   HENT: Negative.   Eyes: Negative.   Respiratory: Positive for shortness of breath.   Cardiovascular: Negative.   Gastrointestinal: Negative.   Endocrine: Negative.   Genitourinary: Negative.   Musculoskeletal: Positive for back pain (chronic).  Skin: Negative.   Allergic/Immunologic: Negative.   Neurological: Positive for dizziness (occasional ) and headaches (Occasional ).  Hematological: Negative.   Psychiatric/Behavioral: Negative.        Objective:    Physical Exam  Constitutional: She is oriented to person, place, and time. She appears well-developed and well-nourished.  HENT:  Head: Normocephalic and atraumatic.  Eyes: Conjunctivae are normal.  Neck: Normal range of motion. Neck supple.  Cardiovascular: Normal rate, regular rhythm, normal heart sounds and intact distal pulses.  Pulmonary/Chest: Effort normal and breath sounds normal.    Abdominal: Soft. Bowel sounds are normal.  Musculoskeletal: Normal range of motion.  Neurological: She is alert and oriented to person,  place, and time. She has normal reflexes.  Skin: Skin is warm and dry.  Psychiatric: She has a normal mood and affect. Her behavior is normal. Judgment and thought content normal.  Nursing note and vitals reviewed.   BP (!) 155/90   Pulse 92   Temp 98.6 F (37 C) (Oral)   Ht 6' (1.829 m)   Wt 210 lb 6.4 oz (95.4 kg)   SpO2 98%   BMI 28.54 kg/m  Wt Readings from Last 3 Encounters:  02/19/19 210 lb 6.4 oz (95.4 kg)  12/25/18 213 lb 9.6 oz (96.9 kg)  10/12/18 207 lb (93.9 kg)    BP Readings from Last 3 Encounters:  02/19/19 (!) 155/90  12/25/18 (!) 144/82  10/12/18 (!) 158/82    Health Maintenance Due  Topic Date Due  . PAP SMEAR-Modifier  09/27/1991    There are no preventive care reminders to display for this patient.  Lab Results  Component Value Date   TSH 1.190 10/12/2018   Lab Results  Component Value Date   WBC 6.6 10/12/2018   HGB 11.2 10/12/2018   HCT 33.7 (L) 10/12/2018   MCV 86 10/12/2018   PLT 277 10/12/2018   Lab Results  Component Value Date   NA 136 10/12/2018   K 4.1 10/12/2018   CO2 20 10/12/2018   GLUCOSE 72 10/12/2018   BUN 9 10/12/2018   CREATININE 0.97 10/12/2018   BILITOT 0.3 10/12/2018   ALKPHOS 92 10/12/2018   AST 16 10/12/2018   ALT 11 10/12/2018   PROT 8.0 10/12/2018   ALBUMIN 5.1 (H) 10/12/2018   CALCIUM 9.6 10/12/2018   ANIONGAP 4 (L) 10/05/2018   Lab Results  Component Value  Date   CHOL 226 (H) 10/12/2018   Lab Results  Component Value Date   HDL 74 10/12/2018   Lab Results  Component Value Date   LDLCALC 119 (H) 10/12/2018   Lab Results  Component Value Date   TRIG 167 (H) 10/12/2018   Lab Results  Component Value Date   CHOLHDL 3.1 10/12/2018   No results found for: HGBA1C  Assessment & Plan:   1. Essential hypertension Blood pressures are elevated today. Clonidine 0.2 mg given to patient in office and blood pressures remain mil elevated. We referred him to ED via ambulance at this time. Patient refused and signed AMA form at discharge. He denies severe headaches, confusion, seizures, double vision, and blurred vision, nausea and vomiting. He will report to ED if he experiences these symptoms. Patient verbalized understanding.   - POCT urinalysis dipstick  2. Elevated blood pressure reading with diagnosis of hypertension - cloNIDine (CATAPRES) tablet 0.2 mg  3. Insomnia, unspecified type We will initiate Trazodone today.  - traZODone (DESYREL) 50 MG tablet; Take 1 tablet (50 mg total) by mouth at bedtime as needed for sleep.  Dispense: 30 tablet; Refill: 2  4. Anxiety Moderate today. She r/t insomnia.   5. Left breast pain We will refer her for Mammogram today.   6. Follow up Follow up in 1 week for blood pressure check only. Follow up in 1 month for office visit.   Meds ordered this encounter  Medications  . cloNIDine (CATAPRES) tablet 0.1 mg  . traZODone (DESYREL) 50 MG tablet    Sig: Take 1 tablet (50 mg total) by mouth at bedtime as needed for sleep.    Dispense:  30 tablet    Refill:  2   Orders Placed This Encounter  Procedures  .  POCT urinalysis dipstick    Referral Orders  No referral(s) requested today    Raliegh IpNatalie Christelle Igoe,  MSN, FNP-BC [facility] [address]  [place], [place] [address] [phone]   Problem List Items Addressed This Visit      Cardiovascular and  Mediastinum   Essential hypertension - Primary   Relevant Orders   POCT urinalysis dipstick (Completed)    Other Visit Diagnoses    Elevated blood pressure reading with diagnosis of hypertension       Relevant Medications   cloNIDine (CATAPRES) tablet 0.1 mg (Completed)   Insomnia, unspecified type       Relevant Medications   traZODone (DESYREL) 50 MG tablet   Anxiety       Relevant Medications   traZODone (DESYREL) 50 MG tablet   Follow up          Meds ordered this encounter  Medications  . cloNIDine (CATAPRES) tablet 0.1 mg  . traZODone (DESYREL) 50 MG tablet    Sig: Take 1 tablet (50 mg total) by mouth at bedtime as needed for sleep.    Dispense:  30 tablet    Refill:  2    Follow-up: No follow-ups on file.    Kallie LocksNatalie M Emmarose Klinke, FNP

## 2019-02-19 NOTE — Patient Instructions (Addendum)
Managing Your Hypertension Hypertension is commonly called high blood pressure. This is when the force of your blood pressing against the walls of your arteries is too strong. Arteries are blood vessels that carry blood from your heart throughout your body. Hypertension forces the heart to work harder to pump blood, and may cause the arteries to become narrow or stiff. Having untreated or uncontrolled hypertension can cause heart attack, stroke, kidney disease, and other problems. What are blood pressure readings? A blood pressure reading consists of a higher number over a lower number. Ideally, your blood pressure should be below 120/80. The first ("top") number is called the systolic pressure. It is a measure of the pressure in your arteries as your heart beats. The second ("bottom") number is called the diastolic pressure. It is a measure of the pressure in your arteries as the heart relaxes. What does my blood pressure reading mean? Blood pressure is classified into four stages. Based on your blood pressure reading, your health care provider may use the following stages to determine what type of treatment you need, if any. Systolic pressure and diastolic pressure are measured in a unit called mm Hg. Normal  Systolic pressure: below 078.  Diastolic pressure: below 80. Elevated  Systolic pressure: 675-449.  Diastolic pressure: below 80. Hypertension stage 1  Systolic pressure: 201-007.  Diastolic pressure: 12-19. Hypertension stage 2  Systolic pressure: 758 or above.  Diastolic pressure: 90 or above. What health risks are associated with hypertension? Managing your hypertension is an important responsibility. Uncontrolled hypertension can lead to:  A heart attack.  A stroke.  A weakened blood vessel (aneurysm).  Heart failure.  Kidney damage.  Eye damage.  Metabolic syndrome.  Memory and concentration problems. What changes can I make to manage my  hypertension? Hypertension can be managed by making lifestyle changes and possibly by taking medicines. Your health care provider will help you make a plan to bring your blood pressure within a normal range. Eating and drinking   Eat a diet that is high in fiber and potassium, and low in salt (sodium), added sugar, and fat. An example eating plan is called the DASH (Dietary Approaches to Stop Hypertension) diet. To eat this way: ? Eat plenty of fresh fruits and vegetables. Try to fill half of your plate at each meal with fruits and vegetables. ? Eat whole grains, such as whole wheat pasta, brown rice, or whole grain bread. Fill about one quarter of your plate with whole grains. ? Eat low-fat diary products. ? Avoid fatty cuts of meat, processed or cured meats, and poultry with skin. Fill about one quarter of your plate with lean proteins such as fish, chicken without skin, beans, eggs, and tofu. ? Avoid premade and processed foods. These tend to be higher in sodium, added sugar, and fat.  Reduce your daily sodium intake. Most people with hypertension should eat less than 1,500 mg of sodium a day.  Limit alcohol intake to no more than 1 drink a day for nonpregnant women and 2 drinks a day for men. One drink equals 12 oz of beer, 5 oz of wine, or 1 oz of hard liquor. Lifestyle  Work with your health care provider to maintain a healthy body weight, or to lose weight. Ask what an ideal weight is for you.  Get at least 30 minutes of exercise that causes your heart to beat faster (aerobic exercise) most days of the week. Activities may include walking, swimming, or biking.  Include exercise  to strengthen your muscles (resistance exercise), such as weight lifting, as part of your weekly exercise routine. Try to do these types of exercises for 30 minutes at least 3 days a week.  Do not use any products that contain nicotine or tobacco, such as cigarettes and e-cigarettes. If you need help quitting,  ask your health care provider.  Control any long-term (chronic) conditions you have, such as high cholesterol or diabetes. Monitoring  Monitor your blood pressure at home as told by your health care provider. Your personal target blood pressure may vary depending on your medical conditions, your age, and other factors.  Have your blood pressure checked regularly, as often as told by your health care provider. Working with your health care provider  Review all the medicines you take with your health care provider because there may be side effects or interactions.  Talk with your health care provider about your diet, exercise habits, and other lifestyle factors that may be contributing to hypertension.  Visit your health care provider regularly. Your health care provider can help you create and adjust your plan for managing hypertension. Will I need medicine to control my blood pressure? Your health care provider may prescribe medicine if lifestyle changes are not enough to get your blood pressure under control, and if:  Your systolic blood pressure is 130 or higher.  Your diastolic blood pressure is 80 or higher. Take medicines only as told by your health care provider. Follow the directions carefully. Blood pressure medicines must be taken as prescribed. The medicine does not work as well when you skip doses. Skipping doses also puts you at risk for problems. Contact a health care provider if:  You think you are having a reaction to medicines you have taken.  You have repeated (recurrent) headaches.  You feel dizzy.  You have swelling in your ankles.  You have trouble with your vision. Get help right away if:  You develop a severe headache or confusion.  You have unusual weakness or numbness, or you feel faint.  You have severe pain in your chest or abdomen.  You vomit repeatedly.  You have trouble breathing. Summary  Hypertension is when the force of blood pumping  through your arteries is too strong. If this condition is not controlled, it may put you at risk for serious complications.  Your personal target blood pressure may vary depending on your medical conditions, your age, and other factors. For most people, a normal blood pressure is less than 120/80.  Hypertension is managed by lifestyle changes, medicines, or both. Lifestyle changes include weight loss, eating a healthy, low-sodium diet, exercising more, and limiting alcohol. This information is not intended to replace advice given to you by your health care provider. Make sure you discuss any questions you have with your health care provider. Document Released: 04/18/2012 Document Revised: 11/16/2018 Document Reviewed: 06/22/2016 Elsevier Patient Education  2020 [employer].   Trazodone tablets What is this medicine? TRAZODONE (TRAZ oh done) is used to treat depression. This medicine may be used for other purposes; ask your health care provider or pharmacist if you have questions. COMMON BRAND NAME(S): Desyrel What should I tell my health care provider before I take this medicine? They need to know if you have any of these conditions:  attempted suicide or thinking about it  bipolar disorder  bleeding problems  glaucoma  heart disease, or previous heart attack  irregular heart beat  kidney or liver disease  low levels of sodium  in the blood  an unusual or allergic reaction to trazodone, other medicines, foods, dyes or preservatives  pregnant or trying to get pregnant  breast-feeding How should I use this medicine? Take this medicine by mouth with a glass of water. Follow the directions on the prescription label. Take this medicine shortly after a meal or a light snack. Take your medicine at regular intervals. Do not take your medicine more often than directed. Do not stop taking this medicine suddenly except upon the advice of your doctor. Stopping this medicine too quickly  may cause serious side effects or your condition may worsen. A special MedGuide will be given to you by the pharmacist with each prescription and refill. Be sure to read this information carefully each time. Talk to your pediatrician regarding the use of this medicine in children. Special care may be needed. Overdosage: If you think you have taken too much of this medicine contact a poison control center or emergency room at once. NOTE: This medicine is only for you. Do not share this medicine with others. What if I miss a dose? If you miss a dose, take it as soon as you can. If it is almost time for your next dose, take only that dose. Do not take double or extra doses. What may interact with this medicine? Do not take this medicine with any of the following medications:  certain medicines for fungal infections like fluconazole, itraconazole, ketoconazole, posaconazole, voriconazole  cisapride  dronedarone  linezolid  MAOIs like Carbex, Eldepryl, Marplan, Nardil, and Parnate  mesoridazine  methylene blue (injected into a vein)  pimozide  saquinavir  thioridazine This medicine may also interact with the following medications:  alcohol  antiviral medicines for HIV or AIDS  aspirin and aspirin-like medicines  barbiturates like phenobarbital  certain medicines for blood pressure, heart disease, irregular heart beat  certain medicines for depression, anxiety, or psychotic disturbances  certain medicines for migraine headache like almotriptan, eletriptan, frovatriptan, naratriptan, rizatriptan, sumatriptan, zolmitriptan  certain medicines for seizures like carbamazepine and phenytoin  certain medicines for sleep  certain medicines that treat or prevent blood clots like dalteparin, enoxaparin, warfarin  digoxin  fentanyl  lithium  NSAIDS, medicines for pain and inflammation, like ibuprofen or naproxen  other medicines that prolong the QT interval (cause an  abnormal heart rhythm) like dofetilide  rasagiline  supplements like St. John's wort, kava kava, valerian  tramadol  tryptophan This list may not describe all possible interactions. Give your health care provider a list of all the medicines, herbs, non-prescription drugs, or dietary supplements you use. Also tell them if you smoke, drink alcohol, or use illegal drugs. Some items may interact with your medicine. What should I watch for while using this medicine? Tell your doctor if your symptoms do not get better or if they get worse. Visit your doctor or health care professional for regular checks on your progress. Because it may take several weeks to see the full effects of this medicine, it is important to continue your treatment as prescribed by your doctor. Patients and their families should watch out for new or worsening thoughts of suicide or depression. Also watch out for sudden changes in feelings such as feeling anxious, agitated, panicky, irritable, hostile, aggressive, impulsive, severely restless, overly excited and hyperactive, or not being able to sleep. If this happens, especially at the beginning of treatment or after a change in dose, call your health care professional. Crystal QuinYou may get drowsy or dizzy. Do not drive,  use machinery, or do anything that needs mental alertness until you know how this medicine affects you. Do not stand or sit up quickly, especially if you are an older patient. This reduces the risk of dizzy or fainting spells. Alcohol may interfere with the effect of this medicine. Avoid alcoholic drinks. This medicine may cause dry eyes and blurred vision. If you wear contact lenses you may feel some discomfort. Lubricating drops may help. See your eye doctor if the problem does not go away or is severe. Your mouth may get dry. Chewing sugarless gum, sucking hard candy and drinking plenty of water may help. Contact your doctor if the problem does not go away or is  severe. What side effects may I notice from receiving this medicine? Side effects that you should report to your doctor or health care professional as soon as possible:  allergic reactions like skin rash, itching or hives, swelling of the face, lips, or tongue  elevated mood, decreased need for sleep, racing thoughts, impulsive behavior  confusion  fast, irregular heartbeat  feeling faint or lightheaded, falls  feeling agitated, angry, or irritable  loss of balance or coordination  painful or prolonged erections  restlessness, pacing, inability to keep still  suicidal thoughts or other mood changes  tremors  trouble sleeping  seizures  unusual bleeding or bruising Side effects that usually do not require medical attention (report to your doctor or health care professional if they continue or are bothersome):  change in sex drive or performance  change in appetite or weight  constipation  headache  muscle aches or pains  nausea This list may not describe all possible side effects. Call your doctor for medical advice about side effects. You may report side effects to FDA at 1-800-FDA-1088. Where should I keep my medicine? Keep out of the reach of children. Store at room temperature between 15 and 30 degrees C (59 to 86 degrees F). Protect from light. Keep container tightly closed. Throw away any unused medicine after the expiration date. NOTE: This sheet is a summary. It may not cover all possible information. If you have questions about this medicine, talk to your doctor, pharmacist, or health care provider.  2020 Elsevier/Gold Standard (2018-07-17 11:46:46)  Insomnia Insomnia is a sleep disorder that makes it difficult to fall asleep or stay asleep. Insomnia can cause fatigue, low energy, difficulty concentrating, mood swings, and poor performance at work or school. There are three different ways to classify insomnia:  Difficulty falling asleep.  Difficulty  staying asleep.  Waking up too early in the morning. Any type of insomnia can be long-term (chronic) or short-term (acute). Both are common. Short-term insomnia usually lasts for three months or less. Chronic insomnia occurs at least three times a week for longer than three months. What are the causes? Insomnia may be caused by another condition, situation, or substance, such as:  Anxiety.  Certain medicines.  Gastroesophageal reflux disease (GERD) or other gastrointestinal conditions.  Asthma or other breathing conditions.  Restless legs syndrome, sleep apnea, or other sleep disorders.  Chronic pain.  Menopause.  Stroke.  Abuse of alcohol, tobacco, or illegal drugs.  Mental health conditions, such as depression.  Caffeine.  Neurological disorders, such as Alzheimer's disease.  An overactive thyroid (hyperthyroidism). Sometimes, the cause of insomnia may not be known. What increases the risk? Risk factors for insomnia include:  Gender. Women are affected more often than men.  Age. Insomnia is more common as you get older.  Stress.  Lack of exercise.  Irregular work schedule or working night shifts.  Traveling between different time zones.  Certain medical and mental health conditions. What are the signs or symptoms? If you have insomnia, the main symptom is having trouble falling asleep or having trouble staying asleep. This may lead to other symptoms, such as:  Feeling fatigued or having low energy.  Feeling nervous about going to sleep.  Not feeling rested in the morning.  Having trouble concentrating.  Feeling irritable, anxious, or depressed. How is this diagnosed? This condition may be diagnosed based on:  Your symptoms and medical history. Your health care provider may ask about: ? Your sleep habits. ? Any medical conditions you have. ? Your mental health.  A physical exam. How is this treated? Treatment for insomnia depends on the cause.  Treatment may focus on treating an underlying condition that is causing insomnia. Treatment may also include:  Medicines to help you sleep.  Counseling or therapy.  Lifestyle adjustments to help you sleep better. Follow these instructions at home: Eating and drinking   Limit or avoid alcohol, caffeinated beverages, and cigarettes, especially close to bedtime. These can disrupt your sleep.  Do not eat a large meal or eat spicy foods right before bedtime. This can lead to digestive discomfort that can make it hard for you to sleep. Sleep habits   Keep a sleep diary to help you and your health care provider figure out what could be causing your insomnia. Write down: ? When you sleep. ? When you wake up during the night. ? How well you sleep. ? How rested you feel the next day. ? Any side effects of medicines you are taking. ? What you eat and drink.  Make your bedroom a dark, comfortable place where it is easy to fall asleep. ? Put up shades or blackout curtains to block light from outside. ? Use a white noise machine to block noise. ? Keep the temperature cool.  Limit screen use before bedtime. This includes: ? Watching TV. ? Using your smartphone, tablet, or computer.  Stick to a routine that includes going to bed and waking up at the same times every day and night. This can help you fall asleep faster. Consider making a quiet activity, such as reading, part of your nighttime routine.  Try to avoid taking naps during the day so that you sleep better at night.  Get out of bed if you are still awake after 15 minutes of trying to sleep. Keep the lights down, but try reading or doing a quiet activity. When you feel sleepy, go back to bed. General instructions  Take over-the-counter and prescription medicines only as told by your health care provider.  Exercise regularly, as told by your health care provider. Avoid exercise starting several hours before bedtime.  Use relaxation  techniques to manage stress. Ask your health care provider to suggest some techniques that may work well for you. These may include: ? Breathing exercises. ? Routines to release muscle tension. ? Visualizing peaceful scenes.  Make sure that you drive carefully. Avoid driving if you feel very sleepy.  Keep all follow-up visits as told by your health care provider. This is important. Contact a health care provider if:  You are tired throughout the day.  You have trouble in your daily routine due to sleepiness.  You continue to have sleep problems, or your sleep problems get worse. Get help right away if:  You have serious thoughts about hurting yourself  or someone else. If you ever feel like you may hurt yourself or others, or have thoughts about taking your own life, get help right away. You can go to your nearest emergency department or call:  Your local emergency services (911 in the [place]  A suicide crisis helpline, such as the National Suicide Prevention Lifeline at (424) 130-71891-715-683-9729. This is open 24 hours a day. Summary  Insomnia is a sleep disorder that makes it difficult to fall asleep or stay asleep.  Insomnia can be long-term (chronic) or short-term (acute).  Treatment for insomnia depends on the cause. Treatment may focus on treating an underlying condition that is causing insomnia.  Keep a sleep diary to help you and your health care provider figure out what could be causing your insomnia. This information is not intended to replace advice given to you by your health care provider. Make sure you discuss any questions you have with your health care provider. Document Released: 07/22/2000 Document Revised: 07/07/2017 Document Reviewed: 05/04/2017 Elsevier Patient Education  2020 [employer]

## 2019-02-26 ENCOUNTER — Other Ambulatory Visit: Payer: Self-pay

## 2019-02-26 ENCOUNTER — Ambulatory Visit (INDEPENDENT_AMBULATORY_CARE_PROVIDER_SITE_OTHER): Payer: Self-pay | Admitting: Family Medicine

## 2019-02-26 VITALS — BP 138/90 | HR 70 | Ht 72.0 in | Wt 210.0 lb

## 2019-02-26 DIAGNOSIS — Z0130 Encounter for examination of blood pressure without abnormal findings: Secondary | ICD-10-CM

## 2019-02-26 DIAGNOSIS — I10 Essential (primary) hypertension: Secondary | ICD-10-CM

## 2019-02-26 NOTE — Progress Notes (Signed)
Patient advise to continue her medication and to write down here readings and keep follow up appointment.

## 2019-03-20 ENCOUNTER — Ambulatory Visit: Payer: Medicaid Other | Admitting: Family Medicine

## 2019-05-06 ENCOUNTER — Ambulatory Visit (INDEPENDENT_AMBULATORY_CARE_PROVIDER_SITE_OTHER): Payer: Self-pay | Admitting: Family Medicine

## 2019-05-06 ENCOUNTER — Other Ambulatory Visit: Payer: Self-pay

## 2019-05-06 ENCOUNTER — Encounter: Payer: Self-pay | Admitting: Family Medicine

## 2019-05-06 VITALS — BP 179/113 | HR 99 | Temp 98.1°F | Ht 72.0 in | Wt 210.5 lb

## 2019-05-06 DIAGNOSIS — Z09 Encounter for follow-up examination after completed treatment for conditions other than malignant neoplasm: Secondary | ICD-10-CM

## 2019-05-06 DIAGNOSIS — I10 Essential (primary) hypertension: Secondary | ICD-10-CM

## 2019-05-06 DIAGNOSIS — R350 Frequency of micturition: Secondary | ICD-10-CM

## 2019-05-06 DIAGNOSIS — M5442 Lumbago with sciatica, left side: Secondary | ICD-10-CM

## 2019-05-06 DIAGNOSIS — J302 Other seasonal allergic rhinitis: Secondary | ICD-10-CM

## 2019-05-06 DIAGNOSIS — G4700 Insomnia, unspecified: Secondary | ICD-10-CM

## 2019-05-06 DIAGNOSIS — G8929 Other chronic pain: Secondary | ICD-10-CM

## 2019-05-06 DIAGNOSIS — M329 Systemic lupus erythematosus, unspecified: Secondary | ICD-10-CM

## 2019-05-06 DIAGNOSIS — F419 Anxiety disorder, unspecified: Secondary | ICD-10-CM

## 2019-05-06 DIAGNOSIS — Z20822 Contact with and (suspected) exposure to covid-19: Secondary | ICD-10-CM

## 2019-05-06 DIAGNOSIS — IMO0002 Reserved for concepts with insufficient information to code with codable children: Secondary | ICD-10-CM

## 2019-05-06 LAB — POCT URINALYSIS DIPSTICK
Bilirubin, UA: NEGATIVE
Blood, UA: NEGATIVE
Glucose, UA: NEGATIVE
Ketones, UA: NEGATIVE
Leukocytes, UA: NEGATIVE
Nitrite, UA: NEGATIVE
Protein, UA: POSITIVE — AB
Spec Grav, UA: 1.025 (ref 1.010–1.025)
Urobilinogen, UA: 1 E.U./dL
pH, UA: 6 (ref 5.0–8.0)

## 2019-05-06 MED ORDER — FLUTICASONE PROPIONATE 50 MCG/ACT NA SUSP: 2.0000 | Freq: Every day | NASAL | 6 refills | Status: DC

## 2019-05-06 MED ORDER — PREDNISONE 10 MG PO TABS: ORAL_TABLET | ORAL | 0 refills | Status: DC

## 2019-05-06 MED ORDER — LORATADINE 10 MG PO TABS: 10.0000 mg | ORAL_TABLET | Freq: Every day | ORAL | 11 refills | Status: AC

## 2019-05-06 MED ORDER — TRAZODONE HCL 100 MG PO TABS: 100.0000 mg | ORAL_TABLET | Freq: Every day | ORAL | 3 refills | Status: DC

## 2019-05-06 NOTE — Progress Notes (Addendum)
[facility]   Established Patient Office Visit  Subjective:  Patient ID: Crystal Winters, female    DOB: 1971-06-07  Age: 48 y.o. MRN: [id number]  CC:  Chief Complaint  Patient presents with  . Follow-up    bodyaches, urinne freq,lower back pain     HPI Crystal Winters is a 48 year old female who presents for Follow Up today.   Past Medical History:  Diagnosis Date  . Anemia   . Back pain   . Diverticulitis   . H/O total vaginal hysterectomy 20 years ago  . Hypertension   . Hypocalcemia   . Lupus (HCC)   . Nausea   . Vertigo    Current Status: Since her last office visit, she has had congestion, cough, sneezing, increased fatigue, vertigo, headaches, and body aches. She does have diagnosis of Lupus. Her last follow up with Rheumatologist in 03/2018. She does report a low grade fever of 99.0. She also reports shortness of breath, occasional heart palpitation, and lower back pain. She has had these symptoms X 3 days now. She denies any contact with persons who have infections. She denies visual changes, chest pain,. and falls. She has occasional headaches and dizziness with position changes. Denies severe headaches, confusion, seizures, double vision, and blurred vision, nausea and vomiting. Her anxiety is moderate today. She denies suicidal ideations, homicidal ideations, or auditory hallucinations. She denies fevers, chills, weight loss, and night sweats. She has not had and visual changes, and falls. No chest pain. No reports of GI problems such as nausea, vomiting, diarrhea, and constipation. She has no reports of blood in stools, dysuria and hematuria.   Past Surgical History:  Procedure Laterality Date  . ABDOMINAL HYSTERECTOMY    . BREAST REDUCTION SURGERY    . CHOLECYSTECTOMY    . COLONOSCOPY WITH PROPOFOL N/A 09/27/2018   Procedure: COLONOSCOPY WITH PROPOFOL;  Surgeon: Graylin ShiverGanem, Salem F, MD;  Location: [facility];  Service:  Endoscopy;  Laterality: N/A;  . FLEXIBLE SIGMOIDOSCOPY N/A 10/03/2018   Procedure: FLEXIBLE SIGMOIDOSCOPY;  Surgeon: Kathi DerBrahmbhatt, Parag, MD;  Location: [facility];  Service: Gastroenterology;  Laterality: N/A;  . TOE SURGERY Right   . TUBAL LIGATION      Family History  Problem Relation Age of Onset  . Rectal cancer Other   . Hypertension Other   . Diabetes Other   . Breast cancer Other   . Colon cancer Other   . Prostate cancer Other   . Stroke Other   . CAD Other   . Coronary artery disease Other     Social History   Socioeconomic History  . Marital status: Divorced    Spouse name: Not on file  . Number of children: Not on file  . Years of education: Not on file  . Highest education level: Not on file  Occupational History  . Not on file  Social Needs  . Financial resource strain: Not on file  . Food insecurity    Worry: Not on file    Inability: Not on file  . Transportation needs    Medical: Not on file    Non-medical: Not on file  Tobacco Use  . Smoking status: Never Smoker  . Smokeless tobacco: Never Used  Substance and Sexual Activity  . Alcohol use: Not Currently  . Drug use: Never  . Sexual activity: Not on file  Lifestyle  . Physical activity    Days per week: Not on file  Minutes per session: Not on file  . Stress: Not on file  Relationships  . Social Musician on phone: Not on file    Gets together: Not on file    Attends religious service: Not on file    Active member of club or organization: Not on file    Attends meetings of clubs or organizations: Not on file    Relationship status: Not on file  . Intimate partner violence    Fear of current or ex partner: Not on file    Emotionally abused: Not on file    Physically abused: Not on file    Forced sexual activity: Not on file  Other Topics Concern  . Not on file  Social History Narrative  . Not on file    Outpatient Medications Prior to Visit  Medication Sig Dispense  Refill  . amLODipine (NORVASC) 10 MG tablet Take 1 tablet (10 mg total) by mouth daily. 30 tablet 3  . citalopram (CELEXA) 10 MG tablet Take 1 tablet (10 mg total) by mouth daily. 30 tablet 3  . gabapentin (NEURONTIN) 300 MG capsule Take 1 capsule (300 mg total) by mouth 3 (three) times daily. 90 capsule 3  . hydroxychloroquine (PLAQUENIL) 200 MG tablet Take 1 tablet (200 mg total) by mouth 2 (two) times daily. 60 tablet 3  . losartan (COZAAR) 50 MG tablet Take 1 tablet (50 mg total) by mouth daily. 30 tablet 3  . meclizine (ANTIVERT) 25 MG tablet Take 1 tablet (25 mg total) by mouth 3 (three) times daily as needed for dizziness. 30 tablet 3  . traZODone (DESYREL) 50 MG tablet Take 1 tablet (50 mg total) by mouth at bedtime as needed for sleep. 30 tablet 2  . cyclobenzaprine (FLEXERIL) 10 MG tablet Take 1 tablet (10 mg total) by mouth 3 (three) times daily as needed for muscle spasms. (Patient not taking: Reported on 05/06/2019) 30 tablet 3  . Melatonin 3 MG CAPS Take 3 mg by mouth at bedtime.    . promethazine (PHENERGAN) 12.5 MG tablet Take 1 tablet (12.5 mg total) by mouth every 6 (six) hours as needed for nausea or vomiting. (Patient not taking: Reported on 02/19/2019) 30 tablet 1   No facility-administered medications prior to visit.     Allergies  Allergen Reactions  . Prochlorperazine Other (See Comments)    Muscle stiffness    ROS Review of Systems  Constitutional: Negative.   HENT: Negative.   Eyes: Negative.   Respiratory: Negative.   Cardiovascular: Negative.   Gastrointestinal: Positive for abdominal distention.  Endocrine: Negative.   Genitourinary: Negative.   Musculoskeletal: Positive for arthralgias (generalized joint pain).  Skin: Negative.   Allergic/Immunologic: Negative.   Neurological: Positive for dizziness (occasional ) and headaches (occasional).  Hematological: Negative.   Psychiatric/Behavioral: Negative.     Objective:    Physical Exam   Constitutional: She is oriented to person, place, and time. She appears well-developed and well-nourished.  HENT:  Head: Normocephalic and atraumatic.  Eyes: Conjunctivae are normal.  Neck: Normal range of motion. Neck supple.  Cardiovascular: Normal rate, regular rhythm, normal heart sounds and intact distal pulses.  Pulmonary/Chest: Effort normal and breath sounds normal.  Abdominal: Soft. Bowel sounds are normal. She exhibits distension (obese).  Musculoskeletal: Normal range of motion.  Neurological: She is alert and oriented to person, place, and time. She has normal reflexes.  Skin: Skin is warm and dry.  Psychiatric: She has a normal mood and affect.  Her behavior is normal. Judgment and thought content normal.  Nursing note and vitals reviewed.   BP (!) 179/113 (BP Location: Right Arm, Patient Position: Sitting, Cuff Size: Normal)   Pulse 99   Temp 98.1 F (36.7 C) (Oral)   Ht 6' (1.829 m)   Wt 210 lb 8 oz (95.5 kg)   SpO2 98%   BMI 28.55 kg/m  Wt Readings from Last 3 Encounters:  05/06/19 210 lb 8 oz (95.5 kg)  02/26/19 210 lb (95.3 kg)  02/19/19 210 lb 6.4 oz (95.4 kg)     Health Maintenance Due  Topic Date Due  . PAP SMEAR-Modifier  09/27/1991  . INFLUENZA VACCINE  03/09/2019    There are no preventive care reminders to display for this patient.  Lab Results  Component Value Date   TSH 1.190 10/12/2018   Lab Results  Component Value Date   WBC 6.6 10/12/2018   HGB 11.2 10/12/2018   HCT 33.7 (L) 10/12/2018   MCV 86 10/12/2018   PLT 277 10/12/2018   Lab Results  Component Value Date   NA 136 10/12/2018   K 4.1 10/12/2018   CO2 20 10/12/2018   GLUCOSE 72 10/12/2018   BUN 9 10/12/2018   CREATININE 0.97 10/12/2018   BILITOT 0.3 10/12/2018   ALKPHOS 92 10/12/2018   AST 16 10/12/2018   ALT 11 10/12/2018   PROT 8.0 10/12/2018   ALBUMIN 5.1 (H) 10/12/2018   CALCIUM 9.6 10/12/2018   ANIONGAP 4 (L) 10/05/2018   Lab Results  Component Value Date    CHOL 226 (H) 10/12/2018   Lab Results  Component Value Date   HDL 74 10/12/2018   Lab Results  Component Value Date   LDLCALC 119 (H) 10/12/2018   Lab Results  Component Value Date   TRIG 167 (H) 10/12/2018   Lab Results  Component Value Date   CHOLHDL 3.1 10/12/2018   No results found for: HGBA1C    Assessment & Plan:   1. Seasonal allergies We will initiate Loratadine today.  - loratadine (CLARITIN) 10 MG tablet; Take 1 tablet (10 mg total) by mouth daily.  Dispense: 30 tablet; Refill: 11 - fluticasone (FLONASE) 50 MCG/ACT nasal spray; Place 2 sprays into both nostrils daily.  Dispense: 16 g; Refill: 6  2. Insomnia, unspecified type - traZODone (DESYREL) 100 MG tablet; Take 1 tablet (100 mg total) by mouth at bedtime.  Dispense: 30 tablet; Refill: 3  3. Essential hypertension Blood pressure is elevated today, which she r/t flare up of Lupus. She will continue to decrease high sodium intake, excessive alcohol intake, increase potassium intake, smoking cessation, and increase physical activity of at least 30 minutes of cardio activity daily. She will continue to follow Heart Healthy or DASH diet. We will recheck blood pressure in 1 week.   4. Chronic bilateral low back pain with left-sided sciatica  5. Lupus (HCC) We will initiate Prednisone tape today. She will follow up with Rheumatologist asap.  - predniSONE (DELTASONE) 10 MG tablet; Day #1: Take 6 tablets (Total = 60 mg), by mouth.  Day #2: Take 5 tablets (Total = 50 mg), by mouth.  Day #3: Take 4 tablets (Total = 40 mg), by mouth.  Day #4: Take 3 tablets (Total = 30 mg), by mouth. Day #5: Take 2 tablets (Total = 20 mg), by mouth.  Day #6: Take 1 tablet (Total = 10 mg), by mouth, then complete.  Dispense: 21 tablet; Refill: 0  6. Anxiety Stable.  7. Urine frequency - POCT Urinalysis Dipstick  8. Follow up She will follow up in 3 months.  She will follow up in 1 week for blood pressure check only.   Meds  ordered this encounter  Medications  . traZODone (DESYREL) 100 MG tablet    Sig: Take 1 tablet (100 mg total) by mouth at bedtime.    Dispense:  30 tablet    Refill:  3  . loratadine (CLARITIN) 10 MG tablet    Sig: Take 1 tablet (10 mg total) by mouth daily.    Dispense:  30 tablet    Refill:  11  . fluticasone (FLONASE) 50 MCG/ACT nasal spray    Sig: Place 2 sprays into both nostrils daily.    Dispense:  16 g    Refill:  6  . predniSONE (DELTASONE) 10 MG tablet    Sig: Day #1: Take 6 tablets (Total = 60 mg), by mouth.  Day #2: Take 5 tablets (Total = 50 mg), by mouth.  Day #3: Take 4 tablets (Total = 40 mg), by mouth.  Day #4: Take 3 tablets (Total = 30 mg), by mouth. Day #5: Take 2 tablets (Total = 20 mg), by mouth.  Day #6: Take 1 tablet (Total = 10 mg), by mouth, then complete.    Dispense:  21 tablet    Refill:  0    Orders Placed This Encounter  Procedures  . POCT Urinalysis Dipstick    Referral Orders  No referral(s) requested today    Raliegh Ip,  MSN, FNP-BC [facility] [address] [place], [place] [address] [phone]   Problem List Items Addressed This Visit      Cardiovascular and Mediastinum   Essential hypertension     Nervous and Auditory   Chronic bilateral low back pain with left-sided sciatica   Relevant Medications   traZODone (DESYREL) 100 MG tablet   predniSONE (DELTASONE) 10 MG tablet     Other   Anxiety   Relevant Medications   traZODone (DESYREL) 100 MG tablet   Insomnia   Relevant Medications   traZODone (DESYREL) 100 MG tablet   Lupus (HCC)   Relevant Medications   predniSONE (DELTASONE) 10 MG tablet    Other Visit Diagnoses    Seasonal allergies    -  Primary   Relevant Medications   loratadine (CLARITIN) 10 MG tablet   fluticasone (FLONASE) 50 MCG/ACT nasal spray   Urine frequency       Relevant Orders   POCT Urinalysis  Dipstick (Completed)   Follow up          Meds ordered this encounter  Medications  . traZODone (DESYREL) 100 MG tablet    Sig: Take 1 tablet (100 mg total) by mouth at bedtime.    Dispense:  30 tablet    Refill:  3  . loratadine (CLARITIN) 10 MG tablet    Sig: Take 1 tablet (10 mg total) by mouth daily.    Dispense:  30 tablet    Refill:  11  . fluticasone (FLONASE) 50 MCG/ACT nasal spray    Sig: Place 2 sprays into both nostrils daily.    Dispense:  16 g    Refill:  6  . predniSONE (DELTASONE) 10 MG tablet    Sig: Day #1: Take 6 tablets (Total = 60 mg), by mouth.  Day #2: Take 5 tablets (Total = 50 mg), by mouth.  Day #3: Take 4 tablets (Total = 40 mg), by mouth.  Day #4: Take 3 tablets (Total = 30 mg), by mouth. Day #5: Take 2 tablets (Total = 20 mg), by mouth.  Day #6: Take 1 tablet (Total = 10 mg), by mouth, then complete.    Dispense:  21 tablet    Refill:  0    Follow-up: Return in about 3 months (around 08/05/2019).    Azzie Glatter, FNP

## 2019-05-06 NOTE — Patient Instructions (Signed)
Allergies, Adult An allergy is when your body's defense system (immune system) overreacts to an otherwise harmless substance (allergen) that you breathe in or eat or something that touches your skin. When you come into contact with something that you are allergic to, your immune system produces certain proteins (antibodies). These proteins cause cells to release chemicals (histamines) that trigger the symptoms of an allergic reaction. Allergies often affect the nasal passages (allergic rhinitis), eyes (allergic conjunctivitis), skin (atopic dermatitis), and stomach. Allergies can be mild or severe. Allergies cannot spread from person to person (are not contagious). They can develop at any age and may be outgrown. What increases the risk? You may be at greater risk of allergies if other people in your family have allergies. What are the signs or symptoms? Symptoms depend on what type of allergy you have. They may include:  Runny, stuffy nose.  Sneezing.  Itchy mouth, ears, or throat.  Postnasal drip.  Sore throat.  Itchy, red, watery, or puffy eyes.  Skin rash or hives.  Stomach pain.  Vomiting.  Diarrhea.  Bloating.  Wheezing or coughing. People with a severe allergy to food, medicine, or an insect bite may have a life-threatening allergic reaction (anaphylaxis). Symptoms of anaphylaxis include:  Hives.  Itching.  Flushed face.  Swollen lips, tongue, or mouth.  Tight or swollen throat.  Chest pain or tightness in the chest.  Trouble breathing or shortness of breath.  Rapid heartbeat.  Dizziness or fainting.  Vomiting.  Diarrhea.  Pain in the abdomen. How is this diagnosed? This condition is diagnosed based on:  Your symptoms.  Your family and medical history.  A physical exam. You may need to see a health care provider who specializes in treating allergies (allergist). You may also have tests, including:  Skin tests to see which allergens are causing  your symptoms, such as: ? Skin prick test. In this test, your skin is pricked with a tiny needle and exposed to small amounts of possible allergens to see if your skin reacts. ? Intradermal skin test. In this test, a small amount of allergen is injected under your skin to see if your skin reacts. ? Patch test. In this test, a small amount of allergen is placed on your skin and then your skin is covered with a bandage. Your health care provider will check your skin after a couple of days to see if a rash has developed.  Blood tests.  Challenges tests. In this test, you inhale a small amount of allergen by mouth to see if you have an allergic reaction. You may also be asked to:  Keep a food diary. A food diary is a record of all the foods and drinks you have in a day and any symptoms you experience.  Practice an elimination diet. An elimination diet involves eliminating specific foods from your diet and then adding them back in one by one to find out if a certain food causes an allergic reaction. How is this treated? Treatment for allergies depends on your symptoms. Treatment may include:  Cold compresses to soothe itching and swelling.  Eye drops.  Nasal sprays.  Using a saline spray or container (neti pot) to flush out the nose (nasal irrigation). These methods can help clear away mucus and keep the nasal passages moist.  Using a humidifier.  Oral antihistamines or other medicines to block allergic reaction and inflammation.  Skin creams to treat rashes or itching.  Diet changes to eliminate food allergy triggers.    Repeated exposure to tiny amounts of allergens to build up a tolerance and prevent future allergic reactions (immunotherapy). These include: ? Allergy shots. ? Oral treatment. This involves taking small doses of an allergen under the tongue (sublingual immunotherapy).  Emergency epinephrine injection (auto-injector) in case of an allergic emergency. This is a  self-injectable, pre-measured medicine that must be given within the first few minutes of a serious allergic reaction. Follow these instructions at home:         Avoid known allergens whenever possible.  If you suffer from airborne allergens, wash out your nose daily. You can do this with a saline spray or a neti pot to flush out your nose (nasal irrigation).  Take over-the-counter and prescription medicines only as told by your health care provider.  Keep all follow-up visits as told by your health care provider. This is important.  If you are at risk of a severe allergic reaction (anaphylaxis), keep your auto-injector with you at all times.  If you have ever had anaphylaxis, wear a medical alert bracelet or necklace that states you have a severe allergy. Contact a health care provider if:  Your symptoms do not improve with treatment. Get help right away if:  You have symptoms of anaphylaxis, such as: ? Swollen mouth, tongue, or throat. ? Pain or tightness in your chest. ? Trouble breathing or shortness of breath. ? Dizziness or fainting. ? Severe abdominal pain, vomiting, or diarrhea. This information is not intended to replace advice given to you by your health care provider. Make sure you discuss any questions you have with your health care provider. Document Released: 10/18/2002 Document Revised: 10/18/2017 Document Reviewed: 02/10/2016 Elsevier Patient Education  2020 [employer] Loratadine capsules or tablets What is this medicine? LORATADINE (lor AT a deen) is an antihistamine. It helps to relieve sneezing, runny nose, and itchy, watery eyes. This medicine is used to treat the symptoms of allergies. It is also used to treat itchy skin rash and hives. This medicine may be used for other purposes; ask your health care provider or pharmacist if you have questions. COMMON BRAND NAME(S): Alavert, Allergy Relief, Claritin, Claritin Hives Relief, Claritin Liqui-Gel,  Claritin-D 24 Hour, Clear-Atadine, QlearQuil All Day & All Night Allergy Relief, Tavist ND What should I tell my health care provider before I take this medicine? They need to know if you have any of these conditions:  asthma  kidney disease  liver disease  an unusual or allergic reaction to loratadine, other antihistamines, other medicines, foods, dyes, or preservatives  pregnant or trying to get pregnant  breast-feeding How should I use this medicine? Take this medicine by mouth with a glass of water. Follow the directions on the label. You may take this medicine with food or on an empty stomach. Take your medicine at regular intervals. Do not take your medicine more often than directed. Talk to your pediatrician regarding the use of this medicine in children. While this medicine may be used in children as young as 6 years for selected conditions, precautions do apply. Overdosage: If you think you have taken too much of this medicine contact a poison control center or emergency room at once. NOTE: This medicine is only for you. Do not share this medicine with others. What if I miss a dose? If you miss a dose, take it as soon as you can. If it is almost time for your next dose, take only that dose. Do not take double or extra doses. What may  interact with this medicine?  other medicines for colds or allergies This list may not describe all possible interactions. Give your health care provider a list of all the medicines, herbs, non-prescription drugs, or dietary supplements you use. Also tell them if you smoke, drink alcohol, or use illegal drugs. Some items may interact with your medicine. What should I watch for while using this medicine? Tell your doctor or healthcare professional if your symptoms do not start to get better or if they get worse. Your mouth may get dry. Chewing sugarless gum or sucking hard candy, and drinking plenty of water may help. Contact your doctor if the  problem does not go away or is severe. You may get drowsy or dizzy. Do not drive, use machinery, or do anything that needs mental alertness until you know how this medicine affects you. Do not stand or sit up quickly, especially if you are an older patient. This reduces the risk of dizzy or fainting spells. What side effects may I notice from receiving this medicine? Side effects that you should report to your doctor or health care professional as soon as possible:  allergic reactions like skin rash, itching or hives, swelling of the face, lips, or tongue  breathing problems  unusually restless or nervous Side effects that usually do not require medical attention (report to your doctor or health care professional if they continue or are bothersome):  drowsiness  dry or irritated mouth or throat  headache This list may not describe all possible side effects. Call your doctor for medical advice about side effects. You may report side effects to FDA at 1-800-FDA-1088. Where should I keep my medicine? Keep out of the reach of children. Store at room temperature between 2 and 30 degrees C (36 and 86 degrees F). Protect from moisture. Throw away any unused medicine after the expiration date. NOTE: This sheet is a summary. It may not cover all possible information. If you have questions about this medicine, talk to your doctor, pharmacist, or health care provider.  2020 Elsevier/Gold Standard (2008-01-28 17:17:24) Fluticasone nasal spray What is this medicine? FLUTICASONE (floo TIK a sone) is a corticosteroid. This medicine is used to treat the symptoms of allergies like sneezing, itchy red eyes, and itchy, runny, or stuffy nose. This medicine is also used to treat nasal polyps. This medicine may be used for other purposes; ask your health care provider or pharmacist if you have questions. COMMON BRAND NAME(S): ClariSpray, Flonase, Flonase Allergy Relief, Flonase Sensimist, Veramyst, XHANCE  What should I tell my health care provider before I take this medicine? They need to know if you have any of these conditions:  eye disease, vision problems  infection, like tuberculosis, herpes, or fungal infection  recent surgery on nose or sinuses  taking a corticosteroid by mouth  an unusual or allergic reaction to fluticasone, steroids, other medicines, foods, dyes, or preservatives  pregnant or trying to get pregnant  breast-feeding How should I use this medicine? This medicine is for use in the nose. Follow the directions on your product or prescription label. This medicine works best if used at regular intervals. Do not use more often than directed. Make sure that you are using your nasal spray correctly. After 6 months of daily use for allergies, talk to your doctor or health care professional before using it for a longer time. Ask your doctor or health care professional if you have any questions. Talk to your pediatrician regarding the use of this medicine  in children. Special care may be needed. Some products have been used for allergies in children as young as 2 years. After 2 months of daily use without a prescription in a child, talk to your pediatrician before using it for a longer time. Use of this medicine for nasal polyps is not approved in children. Overdosage: If you think you have taken too much of this medicine contact a poison control center or emergency room at once. NOTE: This medicine is only for you. Do not share this medicine with others. What if I miss a dose? If you miss a dose, use it as soon as you remember. If it is almost time for your next dose, use only that dose and continue with your regular schedule. Do not use double or extra doses. What may interact with this medicine?  certain antibiotics like clarithromycin and telithromycin  certain medicines for fungal infections like ketoconazole, itraconazole, and voriconazole  conivaptan  nefazodone   some medicines for HIV  vaccines This list may not describe all possible interactions. Give your health care provider a list of all the medicines, herbs, non-prescription drugs, or dietary supplements you use. Also tell them if you smoke, drink alcohol, or use illegal drugs. Some items may interact with your medicine. What should I watch for while using this medicine? Visit your healthcare professional for regular checks on your progress. Tell your healthcare professional if your symptoms do not start to get better or if they get worse. This medicine may increase your risk of getting an infection. Tell your doctor or health care professional if you are around anyone with measles or chickenpox, or if you develop sores or blisters that do not heal properly. What side effects may I notice from receiving this medicine? Side effects that you should report to your doctor or health care professional as soon as possible:  allergic reactions like skin rash, itching or hives, swelling of the face, lips, or tongue  changes in vision  crusting or sores in the nose  nosebleed  signs and symptoms of infection like fever or chills; cough; sore throat  white patches or sores in the mouth or nose Side effects that usually do not require medical attention (report to your doctor or health care professional if they continue or are bothersome):  burning or irritation inside the nose or throat  changes in taste or smell  cough  headache This list may not describe all possible side effects. Call your doctor for medical advice about side effects. You may report side effects to FDA at 1-800-FDA-1088. Where should I keep my medicine? Keep out of the reach of children. Store at room temperature between 15 and 30 degrees C (59 and 86 degrees F). Avoid exposure to extreme heat, cold, or light. Throw away any unused medicine after the expiration date. NOTE: This sheet is a summary. It may not cover all possible  information. If you have questions about this medicine, talk to your doctor, pharmacist, or health care provider.  2020 Elsevier/[employer] Standard (2017-08-17 14:10:08)

## 2019-05-07 LAB — NOVEL CORONAVIRUS, NAA: SARS-CoV-2, NAA: NOT DETECTED

## 2019-05-08 DIAGNOSIS — J302 Other seasonal allergic rhinitis: Secondary | ICD-10-CM | POA: Insufficient documentation

## 2019-05-09 DIAGNOSIS — R809 Proteinuria, unspecified: Secondary | ICD-10-CM

## 2019-05-09 HISTORY — DX: Proteinuria, unspecified: R80.9

## 2019-05-10 DIAGNOSIS — Z5321 Procedure and treatment not carried out due to patient leaving prior to being seen by health care provider: Secondary | ICD-10-CM | POA: Insufficient documentation

## 2019-05-11 ENCOUNTER — Encounter (HOSPITAL_COMMUNITY): Payer: Self-pay

## 2019-05-11 ENCOUNTER — Other Ambulatory Visit: Payer: Self-pay

## 2019-05-11 ENCOUNTER — Emergency Department (HOSPITAL_COMMUNITY)
Admission: EM | Admit: 2019-05-11 | Discharge: 2019-05-11 | Disposition: A | Discharge status: Home / Self Care | Payer: Medicaid Other | Attending: Emergency Medicine | Admitting: Emergency Medicine

## 2019-05-11 NOTE — ED Notes (Signed)
Pt ambulated out of the ED d/t wait time.

## 2019-05-11 NOTE — ED Triage Notes (Signed)
Pt reports that she is having severe body aches. Reprots a hx of lupus and a negative COVID test 4 days ago. Denies SOB or fever. States pain is worst in her lower back.

## 2019-05-13 ENCOUNTER — Ambulatory Visit: Payer: Medicaid Other

## 2019-05-13 ENCOUNTER — Other Ambulatory Visit: Payer: Self-pay

## 2019-05-13 VITALS — BP 140/100

## 2019-05-13 DIAGNOSIS — I10 Essential (primary) hypertension: Secondary | ICD-10-CM

## 2019-05-15 ENCOUNTER — Emergency Department (HOSPITAL_COMMUNITY): Payer: Medicaid Other

## 2019-05-15 ENCOUNTER — Encounter (HOSPITAL_COMMUNITY): Payer: Self-pay | Admitting: Emergency Medicine

## 2019-05-15 ENCOUNTER — Other Ambulatory Visit: Payer: Self-pay

## 2019-05-15 ENCOUNTER — Emergency Department (HOSPITAL_COMMUNITY)
Admission: EM | Admit: 2019-05-15 | Discharge: 2019-05-15 | Disposition: A | Discharge status: Home / Self Care | Payer: Medicaid Other | Attending: Emergency Medicine | Admitting: Emergency Medicine

## 2019-05-15 DIAGNOSIS — I10 Essential (primary) hypertension: Secondary | ICD-10-CM | POA: Insufficient documentation

## 2019-05-15 DIAGNOSIS — Y999 Unspecified external cause status: Secondary | ICD-10-CM | POA: Insufficient documentation

## 2019-05-15 DIAGNOSIS — W01190A Fall on same level from slipping, tripping and stumbling with subsequent striking against furniture, initial encounter: Secondary | ICD-10-CM | POA: Insufficient documentation

## 2019-05-15 DIAGNOSIS — Y929 Unspecified place or not applicable: Secondary | ICD-10-CM | POA: Insufficient documentation

## 2019-05-15 DIAGNOSIS — Y939 Activity, unspecified: Secondary | ICD-10-CM | POA: Insufficient documentation

## 2019-05-15 DIAGNOSIS — M25511 Pain in right shoulder: Secondary | ICD-10-CM | POA: Insufficient documentation

## 2019-05-15 DIAGNOSIS — M79641 Pain in right hand: Secondary | ICD-10-CM | POA: Insufficient documentation

## 2019-05-15 DIAGNOSIS — Z79899 Other long term (current) drug therapy: Secondary | ICD-10-CM | POA: Insufficient documentation

## 2019-05-15 DIAGNOSIS — M25521 Pain in right elbow: Secondary | ICD-10-CM | POA: Insufficient documentation

## 2019-05-15 DIAGNOSIS — M79631 Pain in right forearm: Secondary | ICD-10-CM | POA: Insufficient documentation

## 2019-05-15 DIAGNOSIS — W19XXXA Unspecified fall, initial encounter: Secondary | ICD-10-CM

## 2019-05-15 MED ORDER — ACETAMINOPHEN 500 MG PO TABS: 500.0000 mg | ORAL_TABLET | Freq: Four times a day (QID) | ORAL | 0 refills | Status: DC | PRN

## 2019-05-15 NOTE — ED Notes (Signed)
No respiratory or acute distress noted alert and oriented x 3 call light in reach. 

## 2019-05-15 NOTE — ED Provider Notes (Signed)
[facility]-EMERGENCY DEPT Provider Note   CSN: [id number] Arrival date & time: 05/15/19  1548     History   Chief Complaint Chief Complaint  Patient presents with   Fall   Hand Pain   shoulder pain    HPI Crystal Winters is a 48 y.o. female with no relevant past medical history presents to the ED for ongoing pain discomfort after sustaining a mechanical fall yesterday.  Patient states that she tripped over her dog and fell forward into her nightstand and braced her fall with 2 outstretched arms.  There were no prodromal symptoms.  She did not lose her consciousness or hit her head when she remembers the event in its entirety.  On examination, she states that she is having pain in her right shoulder, right elbow, right forearm, and right hand.  She denies any chest pain, shortness of breath, dizziness, lightheadedness, abdominal discomfort, nausea, vomiting, or any other symptoms.  She has taken gabapentin for discomfort with little relief.     HPI  Past Medical History:  Diagnosis Date   Anemia    Back pain    Diverticulitis    H/O total vaginal hysterectomy 20 years ago   Hypertension    Hypocalcemia    Lupus (HCC)    Nausea    Vertigo     Patient Active Problem List   Diagnosis Date Noted   Seasonal allergies 05/08/2019   Breast pain, left 02/19/2019   Anxiety 02/19/2019   Insomnia 02/19/2019   Elevated blood pressure reading with diagnosis of hypertension 02/19/2019   Exacerbation of systemic lupus (HCC) 12/25/2018   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   Hemorrhage of colon following colonoscopy 11/07/2018   Acute blood loss anemia    Acute GI bleeding 10/02/2018   ARF (acute renal failure) (HCC) 10/02/2018   Lower GI bleed 09/24/2018   Essential hypertension 09/24/2018   Syncope 09/24/2018   Lupus (HCC) 09/24/2018   Hypokalemia 09/24/2018    Past Surgical History:  Procedure  Laterality Date   ABDOMINAL HYSTERECTOMY     BREAST REDUCTION SURGERY     CHOLECYSTECTOMY     COLONOSCOPY WITH PROPOFOL N/A 09/27/2018   Procedure: COLONOSCOPY WITH PROPOFOL;  Surgeon: Graylin Shiver, MD;  Location: [facility];  Service: Endoscopy;  Laterality: N/A;   FLEXIBLE SIGMOIDOSCOPY N/A 10/03/2018   Procedure: FLEXIBLE SIGMOIDOSCOPY;  Surgeon: Kathi Der, MD;  Location: [facility];  Service: Gastroenterology;  Laterality: N/A;   TOE SURGERY Right    TUBAL LIGATION       OB History   No obstetric history on file.      Home Medications    Prior to Admission medications   Medication Sig Start Date End Date Taking? Authorizing Provider  acetaminophen (TYLENOL) 500 MG tablet Take 1 tablet (500 mg total) by mouth every 6 (six) hours as needed. 05/15/19   Lorelee New, PA-C  amLODipine (NORVASC) 10 MG tablet Take 1 tablet (10 mg total) by mouth daily. 10/12/18   [name], FNP  citalopram (CELEXA) 10 MG tablet Take 1 tablet (10 mg total) by mouth daily. 10/12/18   [name], FNP  fluticasone (FLONASE) 50 MCG/ACT nasal spray Place 2 sprays into both nostrils daily. 05/06/19   [name], FNP  gabapentin (NEURONTIN) 300 MG capsule Take 1 capsule (300 mg total) by mouth 3 (three) times daily. 11/07/18   [name], FNP  hydroxychloroquine (PLAQUENIL) 200 MG tablet Take  1 tablet (200 mg total) by mouth 2 (two) times daily. 10/12/18   [name], FNP  loratadine (CLARITIN) 10 MG tablet Take 1 tablet (10 mg total) by mouth daily. 05/06/19   [name], FNP  losartan (COZAAR) 50 MG tablet Take 1 tablet (50 mg total) by mouth daily. 10/12/18   [name], FNP  meclizine (ANTIVERT) 25 MG tablet Take 1 tablet (25 mg total) by mouth 3 (three) times daily as needed for dizziness. 10/12/18   [name], FNP  predniSONE (DELTASONE) 10 MG tablet Day #1: Take 6 tablets (Total = 60 mg), by mouth.  Day #2: Take 5 tablets (Total = 50 mg),  by mouth.  Day #3: Take 4 tablets (Total = 40 mg), by mouth.  Day #4: Take 3 tablets (Total = 30 mg), by mouth. Day #5: Take 2 tablets (Total = 20 mg), by mouth.  Day #6: Take 1 tablet (Total = 10 mg), by mouth, then complete. 05/06/19   [name], FNP  traZODone (DESYREL) 100 MG tablet Take 1 tablet (100 mg total) by mouth at bedtime. 05/06/19   [name], FNP    Family History Family History  Problem Relation Age of Onset   Rectal cancer Other    Hypertension Other    Diabetes Other    Breast cancer Other    Colon cancer Other    Prostate cancer Other    Stroke Other    CAD Other    Coronary artery disease Other     Social History Social History   Tobacco Use   Smoking status: Never Smoker   Smokeless tobacco: Never Used  Substance Use Topics   Alcohol use: Not Currently   Drug use: Never     Allergies   Prochlorperazine   Review of Systems Review of Systems  All other systems reviewed and are negative.    Physical Exam Updated Vital Signs BP (!) 147/107    Pulse 71    Temp 99 F (37.2 C) (Oral)    Resp 14    SpO2 98%   Physical Exam Vitals signs and nursing note reviewed. Exam conducted with a chaperone present.  Constitutional:      Appearance: Normal appearance.  HENT:     Head: Normocephalic and atraumatic.  Eyes:     General: No scleral icterus.    Conjunctiva/sclera: Conjunctivae normal.  Neck:     Musculoskeletal: Normal range of motion and neck supple. No neck rigidity.  Cardiovascular:     Rate and Rhythm: Normal rate and regular rhythm.     Pulses: Normal pulses.     Heart sounds: Normal heart sounds.  Pulmonary:     Effort: Pulmonary effort is normal.     Breath sounds: Normal breath sounds.  Skin:    General: Skin is dry.  Neurological:     Mental Status: She is alert.     GCS: GCS eye subscore is 4. GCS verbal subscore is 5. GCS motor subscore is 6.  Psychiatric:        Mood and Affect: Mood normal.          Behavior: Behavior normal.        Thought Content: Thought content normal.      ED Treatments / Results  Labs (all labs ordered are listed, but only abnormal results are displayed) Labs Reviewed - No data to display  EKG None  Radiology Dg Shoulder Right  Result Date: 05/15/2019 CLINICAL  DATA:  Fall, pain EXAM: RIGHT SHOULDER - 2+ VIEW; RIGHT HUMERUS - 2+ VIEW; RIGHT WRIST - COMPLETE 3+ VIEW; RIGHT FOREARM - 2 VIEW; RIGHT ELBOW - COMPLETE 3+ VIEW; RIGHT HAND - COMPLETE 3+ VIEW COMPARISON:  None. FINDINGS: No fracture or dislocation of the right shoulder. Mild glenohumeral and acromioclavicular arthrosis. The partially imaged chest is unremarkable. No fracture or dislocation of the right humerus. No fracture or dislocation of the right elbow. Joint spaces are preserved. No elbow joint effusion to suggest radiographically occult fracture. No fracture or dislocation of the right radius or ulna. No fracture or dislocation of the right wrist. The carpus is normally aligned. Joint spaces are well preserved. No fracture or dislocation of the right hand. There may be a partial contracture of the right fifth digit. Joint spaces are well preserved. Soft tissues are unremarkable throughout. IMPRESSION: 1. No fracture or dislocation of the right shoulder. Mild glenohumeral and acromioclavicular arthrosis. 2.  No fracture or dislocation of the right humerus. 3. No fracture or dislocation of the right elbow. Joint spaces are preserved. No elbow joint effusion to suggest radiographically occult fracture. 4.  No fracture or dislocation of the right radius or ulna. 5. No fracture or dislocation of the right wrist. The carpus is normally aligned. Joint spaces are well preserved. 6. No fracture or dislocation of the right hand. There may be a partial contracture of the right fifth digit. Joint spaces are well preserved. Electronically Signed   By: Eddie Candle M.D.   On: 05/15/2019 16:58   Dg Elbow Complete  Right  Result Date: 05/15/2019 CLINICAL DATA:  Fall, pain EXAM: RIGHT SHOULDER - 2+ VIEW; RIGHT HUMERUS - 2+ VIEW; RIGHT WRIST - COMPLETE 3+ VIEW; RIGHT FOREARM - 2 VIEW; RIGHT ELBOW - COMPLETE 3+ VIEW; RIGHT HAND - COMPLETE 3+ VIEW COMPARISON:  None. FINDINGS: No fracture or dislocation of the right shoulder. Mild glenohumeral and acromioclavicular arthrosis. The partially imaged chest is unremarkable. No fracture or dislocation of the right humerus. No fracture or dislocation of the right elbow. Joint spaces are preserved. No elbow joint effusion to suggest radiographically occult fracture. No fracture or dislocation of the right radius or ulna. No fracture or dislocation of the right wrist. The carpus is normally aligned. Joint spaces are well preserved. No fracture or dislocation of the right hand. There may be a partial contracture of the right fifth digit. Joint spaces are well preserved. Soft tissues are unremarkable throughout. IMPRESSION: 1. No fracture or dislocation of the right shoulder. Mild glenohumeral and acromioclavicular arthrosis. 2.  No fracture or dislocation of the right humerus. 3. No fracture or dislocation of the right elbow. Joint spaces are preserved. No elbow joint effusion to suggest radiographically occult fracture. 4.  No fracture or dislocation of the right radius or ulna. 5. No fracture or dislocation of the right wrist. The carpus is normally aligned. Joint spaces are well preserved. 6. No fracture or dislocation of the right hand. There may be a partial contracture of the right fifth digit. Joint spaces are well preserved. Electronically Signed   By: Eddie Candle M.D.   On: 05/15/2019 16:58   Dg Forearm Right  Result Date: 05/15/2019 CLINICAL DATA:  Fall, pain EXAM: RIGHT SHOULDER - 2+ VIEW; RIGHT HUMERUS - 2+ VIEW; RIGHT WRIST - COMPLETE 3+ VIEW; RIGHT FOREARM - 2 VIEW; RIGHT ELBOW - COMPLETE 3+ VIEW; RIGHT HAND - COMPLETE 3+ VIEW COMPARISON:  None. FINDINGS: No fracture or  dislocation of the right  shoulder. Mild glenohumeral and acromioclavicular arthrosis. The partially imaged chest is unremarkable. No fracture or dislocation of the right humerus. No fracture or dislocation of the right elbow. Joint spaces are preserved. No elbow joint effusion to suggest radiographically occult fracture. No fracture or dislocation of the right radius or ulna. No fracture or dislocation of the right wrist. The carpus is normally aligned. Joint spaces are well preserved. No fracture or dislocation of the right hand. There may be a partial contracture of the right fifth digit. Joint spaces are well preserved. Soft tissues are unremarkable throughout. IMPRESSION: 1. No fracture or dislocation of the right shoulder. Mild glenohumeral and acromioclavicular arthrosis. 2.  No fracture or dislocation of the right humerus. 3. No fracture or dislocation of the right elbow. Joint spaces are preserved. No elbow joint effusion to suggest radiographically occult fracture. 4.  No fracture or dislocation of the right radius or ulna. 5. No fracture or dislocation of the right wrist. The carpus is normally aligned. Joint spaces are well preserved. 6. No fracture or dislocation of the right hand. There may be a partial contracture of the right fifth digit. Joint spaces are well preserved. Electronically Signed   By: Lauralyn Primes M.D.   On: 05/15/2019 16:58   Dg Wrist Complete Right  Result Date: 05/15/2019 CLINICAL DATA:  Fall, pain EXAM: RIGHT SHOULDER - 2+ VIEW; RIGHT HUMERUS - 2+ VIEW; RIGHT WRIST - COMPLETE 3+ VIEW; RIGHT FOREARM - 2 VIEW; RIGHT ELBOW - COMPLETE 3+ VIEW; RIGHT HAND - COMPLETE 3+ VIEW COMPARISON:  None. FINDINGS: No fracture or dislocation of the right shoulder. Mild glenohumeral and acromioclavicular arthrosis. The partially imaged chest is unremarkable. No fracture or dislocation of the right humerus. No fracture or dislocation of the right elbow. Joint spaces are preserved. No elbow joint  effusion to suggest radiographically occult fracture. No fracture or dislocation of the right radius or ulna. No fracture or dislocation of the right wrist. The carpus is normally aligned. Joint spaces are well preserved. No fracture or dislocation of the right hand. There may be a partial contracture of the right fifth digit. Joint spaces are well preserved. Soft tissues are unremarkable throughout. IMPRESSION: 1. No fracture or dislocation of the right shoulder. Mild glenohumeral and acromioclavicular arthrosis. 2.  No fracture or dislocation of the right humerus. 3. No fracture or dislocation of the right elbow. Joint spaces are preserved. No elbow joint effusion to suggest radiographically occult fracture. 4.  No fracture or dislocation of the right radius or ulna. 5. No fracture or dislocation of the right wrist. The carpus is normally aligned. Joint spaces are well preserved. 6. No fracture or dislocation of the right hand. There may be a partial contracture of the right fifth digit. Joint spaces are well preserved. Electronically Signed   By: Lauralyn Primes M.D.   On: 05/15/2019 16:58   Dg Humerus Right  Result Date: 05/15/2019 CLINICAL DATA:  Fall, pain EXAM: RIGHT SHOULDER - 2+ VIEW; RIGHT HUMERUS - 2+ VIEW; RIGHT WRIST - COMPLETE 3+ VIEW; RIGHT FOREARM - 2 VIEW; RIGHT ELBOW - COMPLETE 3+ VIEW; RIGHT HAND - COMPLETE 3+ VIEW COMPARISON:  None. FINDINGS: No fracture or dislocation of the right shoulder. Mild glenohumeral and acromioclavicular arthrosis. The partially imaged chest is unremarkable. No fracture or dislocation of the right humerus. No fracture or dislocation of the right elbow. Joint spaces are preserved. No elbow joint effusion to suggest radiographically occult fracture. No fracture or dislocation of the right radius or  ulna. No fracture or dislocation of the right wrist. The carpus is normally aligned. Joint spaces are well preserved. No fracture or dislocation of the right hand. There may  be a partial contracture of the right fifth digit. Joint spaces are well preserved. Soft tissues are unremarkable throughout. IMPRESSION: 1. No fracture or dislocation of the right shoulder. Mild glenohumeral and acromioclavicular arthrosis. 2.  No fracture or dislocation of the right humerus. 3. No fracture or dislocation of the right elbow. Joint spaces are preserved. No elbow joint effusion to suggest radiographically occult fracture. 4.  No fracture or dislocation of the right radius or ulna. 5. No fracture or dislocation of the right wrist. The carpus is normally aligned. Joint spaces are well preserved. 6. No fracture or dislocation of the right hand. There may be a partial contracture of the right fifth digit. Joint spaces are well preserved. Electronically Signed   By: Lauralyn Primes M.D.   On: 05/15/2019 16:58   Dg Hand Complete Right  Result Date: 05/15/2019 CLINICAL DATA:  Fall, pain EXAM: RIGHT SHOULDER - 2+ VIEW; RIGHT HUMERUS - 2+ VIEW; RIGHT WRIST - COMPLETE 3+ VIEW; RIGHT FOREARM - 2 VIEW; RIGHT ELBOW - COMPLETE 3+ VIEW; RIGHT HAND - COMPLETE 3+ VIEW COMPARISON:  None. FINDINGS: No fracture or dislocation of the right shoulder. Mild glenohumeral and acromioclavicular arthrosis. The partially imaged chest is unremarkable. No fracture or dislocation of the right humerus. No fracture or dislocation of the right elbow. Joint spaces are preserved. No elbow joint effusion to suggest radiographically occult fracture. No fracture or dislocation of the right radius or ulna. No fracture or dislocation of the right wrist. The carpus is normally aligned. Joint spaces are well preserved. No fracture or dislocation of the right hand. There may be a partial contracture of the right fifth digit. Joint spaces are well preserved. Soft tissues are unremarkable throughout. IMPRESSION: 1. No fracture or dislocation of the right shoulder. Mild glenohumeral and acromioclavicular arthrosis. 2.  No fracture or dislocation  of the right humerus. 3. No fracture or dislocation of the right elbow. Joint spaces are preserved. No elbow joint effusion to suggest radiographically occult fracture. 4.  No fracture or dislocation of the right radius or ulna. 5. No fracture or dislocation of the right wrist. The carpus is normally aligned. Joint spaces are well preserved. 6. No fracture or dislocation of the right hand. There may be a partial contracture of the right fifth digit. Joint spaces are well preserved. Electronically Signed   By: Lauralyn Primes M.D.   On: 05/15/2019 16:58    Procedures Procedures (including critical care time)  Medications Ordered in ED Medications - No data to display   Initial Impression / Assessment and Plan / ED Course  I have reviewed the triage vital signs and the nursing notes.  Pertinent labs & imaging results that were available during my care of the patient were reviewed by me and considered in my medical decision making (see chart for details).        Patient sustained a mechanical fall, but she received numerous plain films which were interpreted and there is no evidence of fracture dislocation.  On physical exam, she was able to exhibit full range of motion and with strength bilaterally.  Neurovascularly she was intact.  Cap refill, distal pulses, and sensation all intact.  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  Is also the area which is significantly bruised and tender, concern for occult fracture particularly involving PIP.  Will splint in extension and have her follow-up with hand surgeon outpatient this week.  She did not hit her head or have any syncopal episode requiring further work-up.  Given her history of upper GI bleed she was hospitalized for 6 months ago, she would not receive any ibuprofen for her discomfort.  Instead, will prescribe her Tylenol 500 mg every 4 hours.  Patient voiced  understanding and is agreeable plan.  Final Clinical Impressions(s) / ED Diagnoses   Final diagnoses:  Fall, initial encounter    ED Discharge Orders         Ordered    acetaminophen (TYLENOL) 500 MG tablet  Every 6 hours PRN     05/15/19 1832           Lorelee NewGreen, Althea Backs L, PA-C 05/15/19 1914    Melene PlanFloyd, Dan, DO 05/15/19 2059

## 2019-05-15 NOTE — Discharge Instructions (Signed)
Please get established with a PCP at [facility].  Please follow-up with [facility] this week for your right fifth finger contracture.  Take Tylenol 500 mg as prescribed.

## 2019-05-15 NOTE — ED Triage Notes (Signed)
Pt reports she fell yesterday and when trying to catch herself hurts bilat hands entire right arm and shoulder.

## 2019-05-21 ENCOUNTER — Other Ambulatory Visit: Payer: Self-pay

## 2019-05-28 ENCOUNTER — Other Ambulatory Visit: Payer: Self-pay

## 2019-05-28 ENCOUNTER — Encounter: Payer: Self-pay | Admitting: Family Medicine

## 2019-05-28 ENCOUNTER — Ambulatory Visit (INDEPENDENT_AMBULATORY_CARE_PROVIDER_SITE_OTHER): Payer: Self-pay | Admitting: Family Medicine

## 2019-05-28 VITALS — BP 148/94 | HR 96 | Temp 98.2°F | Ht 73.0 in | Wt 208.0 lb

## 2019-05-28 DIAGNOSIS — IMO0002 Reserved for concepts with insufficient information to code with codable children: Secondary | ICD-10-CM

## 2019-05-28 DIAGNOSIS — Z9181 History of falling: Secondary | ICD-10-CM

## 2019-05-28 DIAGNOSIS — Z09 Encounter for follow-up examination after completed treatment for conditions other than malignant neoplasm: Secondary | ICD-10-CM

## 2019-05-28 DIAGNOSIS — Z0000 Encounter for general adult medical examination without abnormal findings: Secondary | ICD-10-CM

## 2019-05-28 DIAGNOSIS — I10 Essential (primary) hypertension: Secondary | ICD-10-CM

## 2019-05-28 DIAGNOSIS — G8929 Other chronic pain: Secondary | ICD-10-CM

## 2019-05-28 DIAGNOSIS — R4589 Other symptoms and signs involving emotional state: Secondary | ICD-10-CM

## 2019-05-28 DIAGNOSIS — G4700 Insomnia, unspecified: Secondary | ICD-10-CM

## 2019-05-28 DIAGNOSIS — F419 Anxiety disorder, unspecified: Secondary | ICD-10-CM

## 2019-05-28 DIAGNOSIS — M5442 Lumbago with sciatica, left side: Secondary | ICD-10-CM

## 2019-05-28 DIAGNOSIS — M329 Systemic lupus erythematosus, unspecified: Secondary | ICD-10-CM

## 2019-05-28 DIAGNOSIS — F418 Other specified anxiety disorders: Secondary | ICD-10-CM

## 2019-05-28 DIAGNOSIS — R809 Proteinuria, unspecified: Secondary | ICD-10-CM

## 2019-05-28 LAB — POCT URINALYSIS DIPSTICK
Bilirubin, UA: NEGATIVE
Glucose, UA: NEGATIVE
Ketones, UA: NEGATIVE
Leukocytes, UA: NEGATIVE
Nitrite, UA: NEGATIVE
Protein, UA: POSITIVE — AB
Spec Grav, UA: >=1.03 — AB (ref 1.010–1.025)
Urobilinogen, UA: 0.2 E.U./dL
pH, UA: 6 (ref 5.0–8.0)

## 2019-05-28 LAB — POCT GLYCOSYLATED HEMOGLOBIN (HGB A1C): Hemoglobin A1C: 4.9 % (ref 4.0–5.6)

## 2019-05-28 LAB — GLUCOSE, POCT (MANUAL RESULT ENTRY): POC Glucose: 111 mg/dl — AB (ref 70–99)

## 2019-05-28 MED ORDER — CLONIDINE HCL 0.1 MG PO TABS
0.1000 mg | ORAL_TABLET | Freq: Once | ORAL | Status: AC
  Administered 2019-05-28: 14:00:00 0.1 mg via ORAL

## 2019-05-28 MED ORDER — TRAZODONE HCL 150 MG PO TABS: 150.0000 mg | ORAL_TABLET | Freq: Every day | ORAL | 6 refills | Status: DC

## 2019-05-28 MED ORDER — OXYCODONE-ACETAMINOPHEN 10-325 MG PO TABS: 1.0000 | ORAL_TABLET | Freq: Three times a day (TID) | ORAL | 0 refills | Status: DC | PRN

## 2019-05-28 NOTE — Progress Notes (Signed)
Patient Crystal Winters and Crystal Winters Follow Up  Subjective:  Patient ID: Crystal Winters, female    DOB: Sep 23, 1970  Age: 48 y.o. MRN: [id number]  CC:  Chief Complaint  Patient presents with  . sick visit    stomach pain, stomach cramps  . Hospitalization Follow-up    finger injury   HPI Crystal Winters is a 48 year old female who presents for Sick Visit today.   Past Medical History:  Diagnosis Date  . Anemia   . Back pain   . Diverticulitis   . H/O total vaginal hysterectomy 20 years ago  . Hypertension   . Hypocalcemia   . Lupus ([place])   . Nausea   . Vertigo    Current Status: Since her last office visit, she continues to have generalized increased pain, r/t Lupus. Her chronic back pain is effected more. She does have appointment scheduled with Rheumatologist at @ [facility] next week. She denies visual changes, chest pain, cough, shortness of breath, heart palpitations, and falls. She has occasional headaches and dizziness with position changes. Denies severe headaches, confusion, seizures, double vision, and blurred vision, nausea and vomiting. She denies fevers, chills, fatigue, recent infections, weight loss, and night sweats. She has not had any headaches, visual changes, dizziness, and falls. No chest pain, heart palpitations, cough and shortness of breath reported. No reports of GI problems such as nausea, vomiting, diarrhea, and constipation. She has no reports of blood in stools, dysuria and hematuria. Her anxiety is moderate today, r/t her Lupus. She denies suicidal ideations, homicidal ideations, or auditory hallucinations.    Past Surgical History:  Procedure Laterality Date  . ABDOMINAL HYSTERECTOMY    . BREAST REDUCTION SURGERY    . CHOLECYSTECTOMY    . COLONOSCOPY WITH PROPOFOL N/A 09/27/2018   Procedure: COLONOSCOPY WITH PROPOFOL;  Surgeon: Wonda Horner, MD;  Location: [facility];  Service: Endoscopy;  Laterality:  N/A;  . FLEXIBLE SIGMOIDOSCOPY N/A 10/03/2018   Procedure: FLEXIBLE SIGMOIDOSCOPY;  Surgeon: Otis Brace, MD;  Location: [facility];  Service: Gastroenterology;  Laterality: N/A;  . TOE SURGERY Right   . TUBAL LIGATION      Family History  Problem Relation Age of Onset  . Rectal cancer Other   . Hypertension Other   . Diabetes Other   . Breast cancer Other   . Colon cancer Other   . Prostate cancer Other   . Stroke Other   . CAD Other   . Coronary artery disease Other     Social History   Socioeconomic History  . Marital status: Divorced    Spouse name: Not on file  . Number of children: Not on file  . Years of education: Not on file  . Highest education level: Not on file  Occupational History  . Not on file  Social Needs  . Financial resource strain: Not on file  . Food insecurity    Worry: Not on file    Inability: Not on file  . Transportation needs    Medical: Not on file    Non-medical: Not on file  Tobacco Use  . Smoking status: Never Smoker  . Smokeless tobacco: Never Used  Substance and Sexual Activity  . Alcohol use: Not Currently  . Drug use: Never  . Sexual activity: Not on file  Lifestyle  . Physical activity    Days per week: Not on file    Minutes per session: Not on file  .  Stress: Not on file  Relationships  . Social Musicianconnections    Talks on phone: Not on file    Gets together: Not on file    Attends religious service: Not on file    Active member of club or organization: Not on file    Attends meetings of clubs or organizations: Not on file    Relationship status: Not on file  . Intimate partner violence    Fear of current or ex partner: Not on file    Emotionally abused: Not on file    Physically abused: Not on file    Forced sexual activity: Not on file  Other Topics Concern  . Not on file  Social History Narrative  . Not on file    Outpatient Medications Prior to Visit  Medication Sig Dispense Refill  . acetaminophen  (TYLENOL) 500 MG tablet Take 1 tablet (500 mg total) by mouth every 6 (six) hours as needed. 30 tablet 0  . amLODipine (NORVASC) 10 MG tablet Take 1 tablet (10 mg total) by mouth daily. 30 tablet 3  . citalopram (CELEXA) 10 MG tablet Take 1 tablet (10 mg total) by mouth daily. 30 tablet 3  . fluticasone (FLONASE) 50 MCG/ACT nasal spray Place 2 sprays into both nostrils daily. 16 g 6  . gabapentin (NEURONTIN) 300 MG capsule Take 1 capsule (300 mg total) by mouth 3 (three) times daily. 90 capsule 3  . hydroxychloroquine (PLAQUENIL) 200 MG tablet Take 1 tablet (200 mg total) by mouth 2 (two) times daily. 60 tablet 3  . loratadine (CLARITIN) 10 MG tablet Take 1 tablet (10 mg total) by mouth daily. 30 tablet 11  . losartan (COZAAR) 50 MG tablet Take 1 tablet (50 mg total) by mouth daily. 30 tablet 3  . meclizine (ANTIVERT) 25 MG tablet Take 1 tablet (25 mg total) by mouth 3 (three) times daily as needed for dizziness. 30 tablet 3  . predniSONE (DELTASONE) 10 MG tablet Day #1: Take 6 tablets (Total = 60 mg), by mouth.  Day #2: Take 5 tablets (Total = 50 mg), by mouth.  Day #3: Take 4 tablets (Total = 40 mg), by mouth.  Day #4: Take 3 tablets (Total = 30 mg), by mouth. Day #5: Take 2 tablets (Total = 20 mg), by mouth.  Day #6: Take 1 tablet (Total = 10 mg), by mouth, then complete. 21 tablet 0  . traZODone (DESYREL) 100 MG tablet Take 1 tablet (100 mg total) by mouth at bedtime. 30 tablet 3   No facility-administered medications prior to visit.     Allergies  Allergen Reactions  . Prochlorperazine Other (See Comments)    Muscle stiffness    ROS Review of Systems  Constitutional: Negative.   HENT: Negative.   Eyes: Negative.   Respiratory: Negative.   Cardiovascular: Negative.   Gastrointestinal: Positive for abdominal distention (occasional ).  Endocrine: Negative.   Genitourinary: Negative.   Musculoskeletal: Positive for arthralgias (generalized chronic pain) and back pain (chronic).   Skin: Negative.   Neurological: Positive for dizziness (occasional ) and headaches (occasional ).  Hematological: Negative.   Psychiatric/Behavioral: The patient is nervous/anxious.       Objective:    Physical Exam  Constitutional: She is oriented to person, place, and time. She appears well-developed and well-nourished.  HENT:  Head: Normocephalic and atraumatic.  Eyes: Conjunctivae are normal.  Neck: Normal range of motion. Neck supple.  Cardiovascular: Normal rate, regular rhythm, normal heart sounds and intact distal pulses.  Pulmonary/Chest: Effort normal and breath sounds normal.  Abdominal: Soft. Bowel sounds are normal.  Musculoskeletal: Normal range of motion.  Neurological: She is alert and oriented to person, place, and time. She has normal reflexes.  Skin: Skin is warm and dry.  Psychiatric: She has a normal mood and affect. Her behavior is normal. Judgment and thought content normal.  Nursing note and vitals reviewed.   BP (!) 148/94   Pulse 96   Temp 98.2 F (36.8 C) (Oral)   Ht 6\' 1"  (1.854 m)   Wt 208 lb (94.3 kg)   BMI 27.44 kg/m  Wt Readings from Last 3 Encounters:  05/28/19 208 lb (94.3 kg)  05/06/19 210 lb 8 oz (95.5 kg)  02/26/19 210 lb (95.3 kg)   Health Maintenance Due  Topic Date Due  . PAP SMEAR-Modifier  09/27/1991  . INFLUENZA VACCINE  03/09/2019    There are no preventive care reminders to display for this patient.  Lab Results  Component Value Date   TSH 1.190 10/12/2018   Lab Results  Component Value Date   WBC 6.6 10/12/2018   HGB 11.2 10/12/2018   HCT 33.7 (L) 10/12/2018   MCV 86 10/12/2018   PLT 277 10/12/2018   Lab Results  Component Value Date   NA 136 10/12/2018   K 4.1 10/12/2018   CO2 20 10/12/2018   GLUCOSE 72 10/12/2018   BUN 9 10/12/2018   CREATININE 0.97 10/12/2018   BILITOT 0.3 10/12/2018   ALKPHOS 92 10/12/2018   AST 16 10/12/2018   ALT 11 10/12/2018   PROT 8.0 10/12/2018   ALBUMIN 5.1 (H) 10/12/2018    CALCIUM 9.6 10/12/2018   ANIONGAP 4 (L) 10/05/2018   Lab Results  Component Value Date   CHOL 226 (H) 10/12/2018   Lab Results  Component Value Date   HDL 74 10/12/2018   Lab Results  Component Value Date   LDLCALC 119 (H) 10/12/2018   Lab Results  Component Value Date   TRIG 167 (H) 10/12/2018   Lab Results  Component Value Date   CHOLHDL 3.1 10/12/2018   Lab Results  Component Value Date   HGBA1C 4.9 05/28/2019    Assessment & Plan:   1. Winters discharge follow-up  2. History of recent fall  3. Essential hypertension Blood pressures are elevated today. Clonidine 0.3 mg given to patient in office and blood pressures are stabilized today. He will report to ED if he eperiences severe headaches, confusion, seizures, double vision, and blurred vision, nausea and vomiting. He will report to ED if he experiences these symptoms. Patient verbalized understanding.   - cloNIDine (CATAPRES) tablet 0.3 mg  4. Insomnia, unspecified type Increased dosage of Trazodone to 150 mg today.  5. Anxiety  6. Anxiety about health Moderate today.  7. Chronic bilateral low back pain with left-sided sciatica - oxyCODONE-acetaminophen (PERCOCET) 10-325 MG tablet; Take 1 tablet by mouth every 8 (eight) hours as needed for pain.  Dispense: 30 tablet; Refill: 0  8. Lupus (HCC) We will initiate trial of pain medication today. She will follow up with Rheumatologist as scheduled for further evaluation and treatment.  - oxyCODONE-acetaminophen (PERCOCET) 10-325 MG tablet; Take 1 tablet by mouth every 8 (eight) hours as needed for pain.  Dispense: 30 tablet; Refill: 0  9. Proteinuria, unspecified type - Microalbumin/Creatinine Ratio, Urine  10. Healthcare maintenance - POCT glycosylated hemoglobin (Hb A1C) - POCT urinalysis dipstick - POCT glucose (manual entry)  11. Follow up She will follow up  in 6 months.   Meds ordered this encounter  Medications  . cloNIDine (CATAPRES) tablet  0.1 mg  . traZODone (DESYREL) 150 MG tablet    Sig: Take 1 tablet (150 mg total) by mouth at bedtime.    Dispense:  30 tablet    Refill:  6  . oxyCODONE-acetaminophen (PERCOCET) 10-325 MG tablet    Sig: Take 1 tablet by mouth every 8 (eight) hours as needed for pain.    Dispense:  30 tablet    Refill:  0    Order Specific Question:   Supervising Provider    Answer:   Quentin Angst [id number]    Orders Placed This Encounter  Procedures  . Microalbumin/Creatinine Ratio, Urine  . POCT glycosylated hemoglobin (Hb A1C)  . POCT urinalysis dipstick  . POCT glucose (manual entry)    Referral Orders  No referral(s) requested today    Raliegh Ip,  MSN, FNP-BC  [facility] [address] [place], [place] [address] [phone] fax   Problem List Items Addressed This Visit      Cardiovascular and Mediastinum   Essential hypertension     Nervous and Auditory   Chronic bilateral low back pain with left-sided sciatica   Relevant Medications   traZODone (DESYREL) 150 MG tablet   oxyCODONE-acetaminophen (PERCOCET) 10-325 MG tablet     Other   Anxiety   Relevant Medications   traZODone (DESYREL) 150 MG tablet   Insomnia   Lupus (HCC)   Relevant Medications   oxyCODONE-acetaminophen (PERCOCET) 10-325 MG tablet    Other Visit Diagnoses    Winters discharge follow-up    -  Primary   History of recent fall       Anxiety about health       Relevant Medications   traZODone (DESYREL) 150 MG tablet   Proteinuria, unspecified type       Relevant Orders   Microalbumin/Creatinine Ratio, Urine   Healthcare maintenance       Relevant Orders   POCT glycosylated hemoglobin (Hb A1C) (Completed)   POCT urinalysis dipstick (Completed)   POCT glucose (manual entry) (Completed)   Follow up          Meds ordered this encounter  Medications  . cloNIDine (CATAPRES) tablet 0.1 mg  . traZODone  (DESYREL) 150 MG tablet    Sig: Take 1 tablet (150 mg total) by mouth at bedtime.    Dispense:  30 tablet    Refill:  6  . oxyCODONE-acetaminophen (PERCOCET) 10-325 MG tablet    Sig: Take 1 tablet by mouth every 8 (eight) hours as needed for pain.    Dispense:  30 tablet    Refill:  0    Order Specific Question:   Supervising Provider    Answer:   Quentin Angst [id number]    Follow-up: Return in about 6 months (around 11/26/2019).    [name], FNP

## 2019-05-29 ENCOUNTER — Encounter: Payer: Self-pay | Admitting: Family Medicine

## 2019-05-29 LAB — MICROALBUMIN / CREATININE URINE RATIO
Creatinine, Urine: 121.4 mg/dL
Microalb/Creat Ratio: 54 mg/g creat — ABNORMAL HIGH (ref 0–29)
Microalbumin, Urine: 65.2 ug/mL

## 2019-05-30 ENCOUNTER — Encounter: Payer: Self-pay | Admitting: Family Medicine

## 2019-06-19 ENCOUNTER — Encounter: Payer: Self-pay | Admitting: Family Medicine

## 2019-06-24 ENCOUNTER — Encounter: Payer: Self-pay | Admitting: Family Medicine

## 2019-06-24 ENCOUNTER — Other Ambulatory Visit: Payer: Self-pay

## 2019-06-24 ENCOUNTER — Ambulatory Visit (INDEPENDENT_AMBULATORY_CARE_PROVIDER_SITE_OTHER): Payer: Self-pay | Admitting: Family Medicine

## 2019-06-24 DIAGNOSIS — M329 Systemic lupus erythematosus, unspecified: Secondary | ICD-10-CM

## 2019-06-24 DIAGNOSIS — IMO0002 Reserved for concepts with insufficient information to code with codable children: Secondary | ICD-10-CM

## 2019-06-24 DIAGNOSIS — I10 Essential (primary) hypertension: Secondary | ICD-10-CM

## 2019-06-24 DIAGNOSIS — Z09 Encounter for follow-up examination after completed treatment for conditions other than malignant neoplasm: Secondary | ICD-10-CM

## 2019-06-24 DIAGNOSIS — F419 Anxiety disorder, unspecified: Secondary | ICD-10-CM

## 2019-06-24 DIAGNOSIS — G4700 Insomnia, unspecified: Secondary | ICD-10-CM

## 2019-06-24 MED ORDER — CITALOPRAM HYDROBROMIDE 20 MG PO TABS: 20.0000 mg | ORAL_TABLET | Freq: Every day | ORAL | 3 refills | Status: DC

## 2019-06-24 NOTE — Progress Notes (Signed)
Virtual Visit via Telephone Note  I connected with Crystal Winters on 06/24/19 at  9:20 AM EST by telephone and verified that I am speaking with the correct person using two identifiers.   I discussed the limitations, risks, security and privacy concerns of performing an evaluation and management service by telephone and the availability of in person appointments. I also discussed with the patient that there may be a patient responsible charge related to this service. The patient expressed understanding and agreed to proceed.   History of Present Illness:  Past Medical History:  Diagnosis Date  . Anemia   . Back pain   . Diverticulitis   . H/O total vaginal hysterectomy 20 years ago  . Hypertension   . Hypocalcemia   . Lupus ([facility])   . Nausea   . Proteinuria 05/2019  . Vertigo    Family History  Problem Relation Age of Onset  . Rectal cancer Other   . Hypertension Other   . Diabetes Other   . Breast cancer Other   . Colon cancer Other   . Prostate cancer Other   . Stroke Other   . CAD Other   . Coronary artery disease Other     Social History   Socioeconomic History  . Marital status: Divorced    Spouse name: Not on file  . Number of children: Not on file  . Years of education: Not on file  . Highest education level: Not on file  Occupational History  . Not on file  Social Needs  . Financial resource strain: Not on file  . Food insecurity    Worry: Not on file    Inability: Not on file  . Transportation needs    Medical: Not on file    Non-medical: Not on file  Tobacco Use  . Smoking status: Never Smoker  . Smokeless tobacco: Never Used  Substance and Sexual Activity  . Alcohol use: Not Currently  . Drug use: Never  . Sexual activity: Not Currently  Lifestyle  . Physical activity    Days per week: Not on file    Minutes per session: Not on file  . Stress: Not on file  Relationships  . Social Musician on phone: Not on file    Gets together:  Not on file    Attends religious service: Not on file    Active member of club or organization: Not on file    Attends meetings of clubs or organizations: Not on file    Relationship status: Not on file  . Intimate partner violence    Fear of current or ex partner: Not on file    Emotionally abused: Not on file    Physically abused: Not on file    Forced sexual activity: Not on file  Other Topics Concern  . Not on file  Social History Narrative  . Not on file   Allergies  Allergen Reactions  . Prochlorperazine Other (See Comments), Swelling and Palpitations    Muscle stiffness Muscle stiffness   Current Status: Since her last office visit, she is doing well with no complaints. She states that she did follow up with Rheumatologist a scheduled. She has c/o not being able to stay asleep because of her anxiety. She states that Trazodone is not effective. She denies suicidal ideations, homicidal ideations, or auditory hallucinations. She denies fevers, chills, fatigue, recent infections, weight loss, and night sweats. She has not had any headaches, visual changes, dizziness, and  falls. No chest pain, heart palpitations, cough and shortness of breath reported. No reports of GI problems such as nausea, vomiting, diarrhea, and constipation. She has no reports of blood in stools, dysuria and hematuria. She reports generalized pain.    Observations/Objective: Telephone Virtual Visit   Assessment and Plan:  1. Anxiety Moderate. We will increase dosage of Citalopram today.  - citalopram (CELEXA) 20 MG tablet; Take 1 tablet (20 mg total) by mouth daily.  Dispense: 30 tablet; Refill: 3  2. Lupus ([place]) Stable today. She will continue to follow up with Rheumatology as needed.   3. Essential hypertension She will continue to take medications as prescribed, to decrease high sodium intake, excessive alcohol intake, increase potassium intake, smoking cessation, and increase physical activity of at  least 30 minutes of cardio activity daily. She will continue to follow Heart Healthy or DASH diet.  4. Insomnia, unspecified type We will discontinue Trazodone and increased Celexa to 20 mg today.     Meds ordered this encounter  Medications  . citalopram (CELEXA) 20 MG tablet    Sig: Take 1 tablet (20 mg total) by mouth daily.    Dispense:  30 tablet    Refill:  3    No orders of the defined types were placed in this encounter.   Referral Orders  No referral(s) requested today    [name],  MSN, FNP-BC [place] [address] [place], [place] [address] [phone]- fax  Follow Up Instructions:  She will follow up in 3 months.    I discussed the assessment and treatment plan with the patient. The patient was provided an opportunity to ask questions and all were answered. The patient agreed with the plan and demonstrated an understanding of the instructions.   The patient was advised to call back or seek an in-person evaluation if the symptoms worsen or if the condition fails to improve as anticipated.  I provided 20 minutes of non-face-to-face time during this encounter.   Azzie Glatter, FNP

## 2019-06-26 ENCOUNTER — Telehealth: Payer: Self-pay | Admitting: Family Medicine

## 2019-06-26 NOTE — Telephone Encounter (Signed)
-----   Message from Azzie Glatter, [place] sent at 05/31/2019  8:14 AM EDT ----- Regarding: "Refill on Oxycodone" Patient requested refill:  "Please inform patient that refill on Oxycodone was a one time fill from our office. She will have to follow up with Rheumatology for assess on continuation of this medication. We can also refer her to Pain Clinic. Thank you."

## 2019-07-08 ENCOUNTER — Other Ambulatory Visit: Payer: Medicaid Other

## 2019-07-08 DIAGNOSIS — K219 Gastro-esophageal reflux disease without esophagitis: Secondary | ICD-10-CM

## 2019-07-09 LAB — H. PYLORI BREATH TEST: H pylori Breath Test: NEGATIVE

## 2019-07-10 ENCOUNTER — Other Ambulatory Visit: Payer: Self-pay | Admitting: Family Medicine

## 2019-07-10 ENCOUNTER — Telehealth: Payer: Self-pay | Admitting: Family Medicine

## 2019-07-10 DIAGNOSIS — G4700 Insomnia, unspecified: Secondary | ICD-10-CM

## 2019-07-10 MED ORDER — TRAZODONE HCL 150 MG PO TABS: 150.0000 mg | ORAL_TABLET | Freq: Every day | ORAL | 6 refills | Status: DC

## 2019-07-10 NOTE — Telephone Encounter (Signed)
error 

## 2019-08-05 ENCOUNTER — Ambulatory Visit: Payer: Medicaid Other | Admitting: Family Medicine

## 2019-08-12 ENCOUNTER — Encounter (HOSPITAL_COMMUNITY): Payer: Self-pay | Admitting: Emergency Medicine

## 2019-08-12 ENCOUNTER — Emergency Department (HOSPITAL_COMMUNITY)
Admission: EM | Admit: 2019-08-12 | Discharge: 2019-08-12 | Disposition: A | Discharge status: Home / Self Care | Payer: Medicaid Other | Attending: Emergency Medicine | Admitting: Emergency Medicine

## 2019-08-12 ENCOUNTER — Ambulatory Visit (INDEPENDENT_AMBULATORY_CARE_PROVIDER_SITE_OTHER): Payer: Self-pay | Admitting: Family Medicine

## 2019-08-12 ENCOUNTER — Encounter: Payer: Self-pay | Admitting: Family Medicine

## 2019-08-12 ENCOUNTER — Other Ambulatory Visit: Payer: Self-pay

## 2019-08-12 VITALS — BP 160/121 | HR 100 | Temp 98.0°F | Ht 73.0 in | Wt 207.6 lb

## 2019-08-12 DIAGNOSIS — I10 Essential (primary) hypertension: Secondary | ICD-10-CM | POA: Insufficient documentation

## 2019-08-12 DIAGNOSIS — F419 Anxiety disorder, unspecified: Secondary | ICD-10-CM

## 2019-08-12 DIAGNOSIS — M329 Systemic lupus erythematosus, unspecified: Secondary | ICD-10-CM

## 2019-08-12 DIAGNOSIS — Z09 Encounter for follow-up examination after completed treatment for conditions other than malignant neoplasm: Secondary | ICD-10-CM

## 2019-08-12 DIAGNOSIS — I160 Hypertensive urgency: Secondary | ICD-10-CM

## 2019-08-12 DIAGNOSIS — G4700 Insomnia, unspecified: Secondary | ICD-10-CM

## 2019-08-12 DIAGNOSIS — R21 Rash and other nonspecific skin eruption: Secondary | ICD-10-CM

## 2019-08-12 DIAGNOSIS — Z5321 Procedure and treatment not carried out due to patient leaving prior to being seen by health care provider: Secondary | ICD-10-CM | POA: Insufficient documentation

## 2019-08-12 DIAGNOSIS — M5442 Lumbago with sciatica, left side: Secondary | ICD-10-CM

## 2019-08-12 DIAGNOSIS — M5441 Lumbago with sciatica, right side: Secondary | ICD-10-CM

## 2019-08-12 DIAGNOSIS — IMO0002 Reserved for concepts with insufficient information to code with codable children: Secondary | ICD-10-CM

## 2019-08-12 DIAGNOSIS — G8929 Other chronic pain: Secondary | ICD-10-CM

## 2019-08-12 LAB — POCT URINALYSIS DIPSTICK
Bilirubin, UA: NEGATIVE
Blood, UA: NEGATIVE
Glucose, UA: NEGATIVE
Ketones, UA: NEGATIVE
Leukocytes, UA: NEGATIVE
Nitrite, UA: NEGATIVE
Protein, UA: POSITIVE — AB
Spec Grav, UA: 1.025 (ref 1.010–1.025)
Urobilinogen, UA: 2 E.U./dL — AB
pH, UA: 6 (ref 5.0–8.0)

## 2019-08-12 MED ORDER — METHOCARBAMOL 500 MG PO TABS: 500.0000 mg | ORAL_TABLET | Freq: Two times a day (BID) | ORAL | 3 refills | Status: DC | PRN

## 2019-08-12 MED ORDER — CLONIDINE HCL 0.1 MG PO TABS
0.1000 mg | ORAL_TABLET | Freq: Once | ORAL | Status: AC
  Administered 2019-08-12: 12:00:00 0.1 mg via ORAL

## 2019-08-12 MED ORDER — CLONIDINE HCL 0.1 MG PO TABS
0.2000 mg | ORAL_TABLET | Freq: Once | ORAL | Status: AC
  Administered 2019-08-12: 12:00:00 0.2 mg via ORAL

## 2019-08-12 NOTE — ED Triage Notes (Signed)
Pt reports was sent from her PCP due to BP 180/120. Was given medications but unsure what it was called. Also c/o left flank pains and nausea that started today.

## 2019-08-12 NOTE — ED Notes (Signed)
Called for vital recheck with no answer. 

## 2019-08-12 NOTE — ED Notes (Signed)
Pt found on her paperwork that she was given Clonidine but unsure of dosage. Reports was given about 4 of them.

## 2019-08-12 NOTE — ED Notes (Signed)
MADE THREE CALLS FOR RECHECK NO ANSWER

## 2019-08-12 NOTE — Progress Notes (Addendum)
[facility]  Established Patient Office Visit  Subjective:  Patient ID: Crystal Winters, female    DOB: 1971-04-19  Age: 49 y.o. MRN: [id number]  CC:  Chief Complaint  Patient presents with  . Follow-up    3 mth follow up HTN  . Foot Pain    Skin peeling    HPI Crystal Winters is a 51 year presents for Follow Up today.   Past Medical History:  Diagnosis Date  . Anemia   . Back pain   . Diverticulitis   . H/O total vaginal hysterectomy 20 years ago  . Hypertension   . Hypocalcemia   . Lupus ([place])   . Nausea   . Proteinuria 05/2019  . Vertigo    Current Status: Since her last office visit, she is doing well with no complaints. Her anxiety is increased today. She denies suicidal ideations, homicidal ideations, or auditory hallucinations. Her blood pressures are elevated today. She took her blood pressure this morning. She continues to have generalized pain r/t Lupus. She has chronic back pain. She has follow up appointment with Rheumatologist next month. She continues to have increasing anxiety insomnia, which she states Trazodone is not effective. She denies fevers, chills, fatigue, recent infections, weight loss, and night sweats. She has not had any headaches, visual changes, dizziness, and falls. No chest pain, heart palpitations, cough and shortness of breath reported. No reports of GI problems such as nausea, vomiting, diarrhea, and constipation. She has no reports of blood in stools, dysuria and hematuria.   Past Surgical History:  Procedure Laterality Date  . ABDOMINAL HYSTERECTOMY    . BREAST REDUCTION SURGERY    . CHOLECYSTECTOMY    . COLONOSCOPY WITH PROPOFOL N/A 09/27/2018   Procedure: COLONOSCOPY WITH PROPOFOL;  Surgeon: Wonda Horner, MD;  Location: [facility];  Service: Endoscopy;  Laterality: N/A;  . FLEXIBLE SIGMOIDOSCOPY N/A 10/03/2018   Procedure: FLEXIBLE SIGMOIDOSCOPY;  Surgeon: Otis Brace, MD;   Location: [facility];  Service: Gastroenterology;  Laterality: N/A;  . TOE SURGERY Right   . TUBAL LIGATION      Family History  Problem Relation Age of Onset  . Rectal cancer Other   . Hypertension Other   . Diabetes Other   . Breast cancer Other   . Colon cancer Other   . Prostate cancer Other   . Stroke Other   . CAD Other   . Coronary artery disease Other     Social History   Socioeconomic History  . Marital status: Divorced    Spouse name: Not on file  . Number of children: Not on file  . Years of education: Not on file  . Highest education level: Not on file  Occupational History  . Not on file  Tobacco Use  . Smoking status: Never Smoker  . Smokeless tobacco: Never Used  Substance and Sexual Activity  . Alcohol use: Not Currently  . Drug use: Never  . Sexual activity: Not Currently  Other Topics Concern  . Not on file  Social History Narrative  . Not on file   Social Determinants of Health   Financial Resource Strain:   . Difficulty of Paying Living Expenses: Not on file  Food Insecurity:   . Worried About Charity fundraiser in the Last Year: Not on file  . Ran Out of Food in the Last Year: Not on file  Transportation Needs:   . Lack of Transportation (Medical): Not on  file  . Lack of Transportation (Non-Medical): Not on file  Physical Activity:   . Days of Exercise per Week: Not on file  . Minutes of Exercise per Session: Not on file  Stress:   . Feeling of Stress : Not on file  Social Connections:   . Frequency of Communication with Friends and Family: Not on file  . Frequency of Social Gatherings with Friends and Family: Not on file  . Attends Religious Services: Not on file  . Active Member of Clubs or Organizations: Not on file  . Attends Banker Meetings: Not on file  . Marital Status: Not on file  Intimate Partner Violence:   . Fear of Current or Ex-Partner: Not on file  . Emotionally Abused: Not on file  . Physically  Abused: Not on file  . Sexually Abused: Not on file    Outpatient Medications Prior to Visit  Medication Sig Dispense Refill  . acetaminophen (TYLENOL) 500 MG tablet Take 1 tablet (500 mg total) by mouth every 6 (six) hours as needed. 30 tablet 0  . amLODipine (NORVASC) 10 MG tablet Take 1 tablet (10 mg total) by mouth daily. 30 tablet 3  . citalopram (CELEXA) 20 MG tablet Take 1 tablet (20 mg total) by mouth daily. 30 tablet 3  . fluticasone (FLONASE) 50 MCG/ACT nasal spray Place 2 sprays into both nostrils daily. 16 g 6  . gabapentin (NEURONTIN) 300 MG capsule Take 1 capsule (300 mg total) by mouth 3 (three) times daily. 90 capsule 3  . hydroxychloroquine (PLAQUENIL) 200 MG tablet Take 1 tablet (200 mg total) by mouth 2 (two) times daily. 60 tablet 3  . loratadine (CLARITIN) 10 MG tablet Take 1 tablet (10 mg total) by mouth daily. 30 tablet 11  . losartan (COZAAR) 50 MG tablet Take 1 tablet (50 mg total) by mouth daily. 30 tablet 3  . meclizine (ANTIVERT) 25 MG tablet Take 1 tablet (25 mg total) by mouth 3 (three) times daily as needed for dizziness. 30 tablet 3  . traZODone (DESYREL) 150 MG tablet Take 1 tablet (150 mg total) by mouth at bedtime. 30 tablet 6  . furosemide (LASIX) 20 MG tablet Take by mouth.    Marland Kitchen lisinopril (ZESTRIL) 20 MG tablet Take by mouth.    . oxyCODONE-acetaminophen (PERCOCET) 10-325 MG tablet Take 1 tablet by mouth every 8 (eight) hours as needed for pain. (Patient not taking: Reported on 06/24/2019) 30 tablet 0  . predniSONE (DELTASONE) 5 MG tablet Take 4 pills a day for 5 days, then 3 pills a day for 5 days, then 2 pills a day for 5 days, then 1 pill a day for 5 days     No facility-administered medications prior to visit.    Allergies  Allergen Reactions  . Prochlorperazine Other (See Comments), Swelling and Palpitations    Muscle stiffness Muscle stiffness    ROS Review of Systems  Constitutional: Negative.   HENT: Negative.   Eyes: Negative.     Respiratory: Negative.   Cardiovascular: Negative.   Gastrointestinal: Negative.   Endocrine: Negative.   Genitourinary: Negative.   Musculoskeletal: Negative.   Skin: Negative.   Allergic/Immunologic: Negative.   Neurological: Positive for dizziness (occasional ) and headaches (occasional ).  Hematological: Negative.   Psychiatric/Behavioral: Negative.       Objective:    Physical Exam  Constitutional: She is oriented to person, place, and time. She appears well-developed and well-nourished.  HENT:  Head: Normocephalic and atraumatic.  Right Ear: External ear normal.  Left Ear: External ear normal.  Mouth/Throat: Oropharynx is clear and moist.  Eyes: Conjunctivae are normal.  Cardiovascular: Normal rate, regular rhythm, normal heart sounds and intact distal pulses.  Pulmonary/Chest: Effort normal and breath sounds normal.  Abdominal: Soft. Bowel sounds are normal.  Musculoskeletal:        General: Normal range of motion.     Cervical back: Normal range of motion and neck supple.  Neurological: She is alert and oriented to person, place, and time. She has normal reflexes.  Skin: Skin is warm and dry.  Psychiatric: She has a normal mood and affect. Her behavior is normal. Judgment and thought content normal.  Nursing note and vitals reviewed.   BP (!) 160/121   Pulse 100   Temp 98 F (36.7 C)   Ht 6\' 1"  (1.854 m)   Wt 207 lb 9.6 oz (94.2 kg)   SpO2 95%   BMI 27.39 kg/m  Wt Readings from Last 3 Encounters:  08/12/19 207 lb 9.6 oz (94.2 kg)  05/28/19 208 lb (94.3 kg)  05/06/19 210 lb 8 oz (95.5 kg)     Health Maintenance Due  Topic Date Due  . PAP SMEAR-Modifier  09/27/1991  . INFLUENZA VACCINE  03/09/2019    There are no preventive care reminders to display for this patient.  Lab Results  Component Value Date   TSH 1.190 10/12/2018   Lab Results  Component Value Date   WBC 6.6 10/12/2018   HGB 11.2 10/12/2018   HCT 33.7 (L) 10/12/2018   MCV 86  10/12/2018   PLT 277 10/12/2018   Lab Results  Component Value Date   NA 136 10/12/2018   K 4.1 10/12/2018   CO2 20 10/12/2018   GLUCOSE 72 10/12/2018   BUN 9 10/12/2018   CREATININE 0.97 10/12/2018   BILITOT 0.3 10/12/2018   ALKPHOS 92 10/12/2018   AST 16 10/12/2018   ALT 11 10/12/2018   PROT 8.0 10/12/2018   ALBUMIN 5.1 (H) 10/12/2018   CALCIUM 9.6 10/12/2018   ANIONGAP 4 (L) 10/05/2018   Lab Results  Component Value Date   CHOL 226 (H) 10/12/2018   Lab Results  Component Value Date   HDL 74 10/12/2018   Lab Results  Component Value Date   LDLCALC 119 (H) 10/12/2018   Lab Results  Component Value Date   TRIG 167 (H) 10/12/2018   Lab Results  Component Value Date   CHOLHDL 3.1 10/12/2018   Lab Results  Component Value Date   HGBA1C 4.9 05/28/2019   Assessment & Plan:   1. Hypertensive urgency Blood pressures are elevated today. Clonidine 0.3 mg given to patient in office and blood pressures remain elevated. We referred her to ED via ambulance at this time. Patient refused and signed AMA form at discharge. She states that she will drive herself to ED today. She denies severe headaches, confusion, seizures, double vision, and blurred vision, nausea and vomiting. She will report to ED if she experiences these symptoms. Patient verbalized understanding.   - Urinalysis Dipstick - cloNIDine (CATAPRES) tablet 0.2 mg - cloNIDine (CATAPRES) tablet 0.1 mg  2. Essential hypertension - Urinalysis Dipstick - cloNIDine (CATAPRES) tablet 0.2 mg - cloNIDine (CATAPRES) tablet 0.1 mg  2. Anxiety - Ambulatory referral to Psychiatry  3. Insomnia, unspecified type  4. Lupus ([facility]) We will initiate Robaxin today. - methocarbamol (ROBAXIN) 500 MG tablet; Take 1 tablet (500 mg total) by mouth 2 (two) times daily as  needed for muscle spasms.  Dispense: 60 tablet; Refill: 3  5. Rash of foot We will continue to monitor.   6. Chronic bilateral low back pain with bilateral  sciatica - methocarbamol (ROBAXIN) 500 MG tablet; Take 1 tablet (500 mg total) by mouth 2 (two) times daily as needed for muscle spasms.  Dispense: 60 tablet; Refill: 3  7. Follow up Follow up for blood pressure only in 1 week. Follow up in 1 month for office visit.   Meds ordered this encounter  Medications  . methocarbamol (ROBAXIN) 500 MG tablet    Sig: Take 1 tablet (500 mg total) by mouth 2 (two) times daily as needed for muscle spasms.    Dispense:  60 tablet    Refill:  3  . cloNIDine (CATAPRES) tablet 0.2 mg  . cloNIDine (CATAPRES) tablet 0.1 mg   Orders Placed This Encounter  Procedures  . Ambulatory referral to Psychiatry  . Urinalysis Dipstick    Referral Orders     Ambulatory referral to Psychiatry   Raliegh Ip,  MSN, FNP-BC [facility] [address] [place], [place] [address] [phone] fax    Problem List Items Addressed This Visit      Cardiovascular and Mediastinum   Essential hypertension - Primary   Relevant Orders   Urinalysis Dipstick (Completed)     Other   Anxiety   Relevant Orders   Ambulatory referral to Psychiatry   Insomnia   Lupus ([facility])   Relevant Medications   methocarbamol (ROBAXIN) 500 MG tablet    Other Visit Diagnoses    Rash of foot       Chronic bilateral low back pain with bilateral sciatica       Relevant Medications   methocarbamol (ROBAXIN) 500 MG tablet   Follow up          Meds ordered this encounter  Medications  . methocarbamol (ROBAXIN) 500 MG tablet    Sig: Take 1 tablet (500 mg total) by mouth 2 (two) times daily as needed for muscle spasms.    Dispense:  60 tablet    Refill:  3  . cloNIDine (CATAPRES) tablet 0.2 mg  . cloNIDine (CATAPRES) tablet 0.1 mg    Follow-up: No follow-ups on file.    [name], FNP

## 2019-08-12 NOTE — ED Notes (Signed)
THREE CALLS MADE RESTROOM AND PARKING AREA CHECKED N0 ANSWER

## 2019-08-14 ENCOUNTER — Other Ambulatory Visit: Payer: Self-pay | Admitting: Family Medicine

## 2019-08-14 ENCOUNTER — Telehealth: Payer: Self-pay | Admitting: Family Medicine

## 2019-08-14 DIAGNOSIS — G8929 Other chronic pain: Secondary | ICD-10-CM

## 2019-08-14 DIAGNOSIS — M5442 Lumbago with sciatica, left side: Secondary | ICD-10-CM

## 2019-08-16 NOTE — Telephone Encounter (Signed)
Sent to NP 

## 2019-08-19 ENCOUNTER — Other Ambulatory Visit: Payer: Self-pay

## 2019-08-19 ENCOUNTER — Ambulatory Visit: Payer: Medicaid Other | Admitting: Family Medicine

## 2019-08-19 VITALS — BP 142/92 | HR 96 | Temp 97.8°F | Ht 73.0 in | Wt 210.0 lb

## 2019-08-19 DIAGNOSIS — Z0000 Encounter for general adult medical examination without abnormal findings: Secondary | ICD-10-CM

## 2019-08-19 DIAGNOSIS — Z09 Encounter for follow-up examination after completed treatment for conditions other than malignant neoplasm: Secondary | ICD-10-CM

## 2019-08-19 DIAGNOSIS — F419 Anxiety disorder, unspecified: Secondary | ICD-10-CM

## 2019-08-19 DIAGNOSIS — I10 Essential (primary) hypertension: Secondary | ICD-10-CM

## 2019-08-19 DIAGNOSIS — I160 Hypertensive urgency: Secondary | ICD-10-CM

## 2019-08-19 MED ORDER — LOSARTAN POTASSIUM 100 MG PO TABS: 100.0000 mg | ORAL_TABLET | Freq: Every day | ORAL | 3 refills | Status: DC

## 2019-08-19 MED ORDER — AMLODIPINE BESYLATE 10 MG PO TABS: 10.0000 mg | ORAL_TABLET | Freq: Every day | ORAL | 6 refills | Status: DC

## 2019-08-19 MED ORDER — CLONIDINE HCL 0.1 MG PO TABS
0.1000 mg | ORAL_TABLET | Freq: Once | ORAL | Status: AC
  Administered 2019-08-19: 10:00:00 0.1 mg via ORAL

## 2019-08-19 NOTE — Progress Notes (Signed)
[facility]   Established Patient Office Visit  Subjective:  Patient ID: Crystal Winters, female    DOB: 1971-04-14  Age: 49 y.o. MRN: [id number]  CC:  Chief Complaint  Patient presents with  . Follow-up    Elevated BP    HPI Crystal Winters is a 49 year old female presents for Blood Pressure Check and Hypertensive Urgency today.   Past Medical History:  Diagnosis Date  . Anemia   . Back pain   . Diverticulitis   . H/O total vaginal hysterectomy 20 years ago  . Hypertension   . Hypocalcemia   . Lupus ([facility])   . Nausea   . Proteinuria 05/2019  . Vertigo    Current Status: Since her last office visit, her blood pressures are elevated today. She states that she is taking hypertensive medications daily as prescribed. She denies visual changes, chest pain, cough, shortness of breath, heart palpitations, and falls. She has occasional headaches and dizziness with position changes. Denies severe headaches, confusion, seizures, double vision, and blurred vision, nausea and vomiting. Her anxiety is increased because her health conditions, which she has applied for disability since 06/2019. She was referred to Psychiatry and has an appointment on 09/02/2019. She denies suicidal ideations, homicidal ideations, or auditory hallucinations. She denies fevers, chills, fatigue, recent infections, weight loss, and night sweats. No reports of GI problems such as diarrhea, and constipation. She has no reports of blood in stools, dysuria and hematuria. She denies pain today.   Past Surgical History:  Procedure Laterality Date  . ABDOMINAL HYSTERECTOMY    . BREAST REDUCTION SURGERY    . CHOLECYSTECTOMY    . COLONOSCOPY WITH PROPOFOL N/A 09/27/2018   Procedure: COLONOSCOPY WITH PROPOFOL;  Surgeon: Graylin Shiver, MD;  Location: [facility];  Service: Endoscopy;  Laterality: N/A;  . FLEXIBLE SIGMOIDOSCOPY N/A 10/03/2018   Procedure: FLEXIBLE SIGMOIDOSCOPY;   Surgeon: Kathi Der, MD;  Location: [facility];  Service: Gastroenterology;  Laterality: N/A;  . TOE SURGERY Right   . TUBAL LIGATION      Family History  Problem Relation Age of Onset  . Rectal cancer Other   . Hypertension Other   . Diabetes Other   . Breast cancer Other   . Colon cancer Other   . Prostate cancer Other   . Stroke Other   . CAD Other   . Coronary artery disease Other     Social History   Socioeconomic History  . Marital status: Divorced    Spouse name: Not on file  . Number of children: Not on file  . Years of education: Not on file  . Highest education level: Not on file  Occupational History  . Not on file  Tobacco Use  . Smoking status: Never Smoker  . Smokeless tobacco: Never Used  Substance and Sexual Activity  . Alcohol use: Not Currently  . Drug use: Never  . Sexual activity: Not Currently  Other Topics Concern  . Not on file  Social History Narrative  . Not on file   Social Determinants of Health   Financial Resource Strain:   . Difficulty of Paying Living Expenses: Not on file  Food Insecurity:   . Worried About Programme researcher, broadcasting/film/video in the Last Year: Not on file  . Ran Out of Food in the Last Year: Not on file  Transportation Needs:   . Lack of Transportation (Medical): Not on file  . Lack of Transportation (  Non-Medical): Not on file  Physical Activity:   . Days of Exercise per Week: Not on file  . Minutes of Exercise per Session: Not on file  Stress:   . Feeling of Stress : Not on file  Social Connections:   . Frequency of Communication with Friends and Family: Not on file  . Frequency of Social Gatherings with Friends and Family: Not on file  . Attends Religious Services: Not on file  . Active Member of Clubs or Organizations: Not on file  . Attends Archivist Meetings: Not on file  . Marital Status: Not on file  Intimate Partner Violence:   . Fear of Current or Ex-Partner: Not on file  . Emotionally  Abused: Not on file  . Physically Abused: Not on file  . Sexually Abused: Not on file    Outpatient Medications Prior to Visit  Medication Sig Dispense Refill  . acetaminophen (TYLENOL) 500 MG tablet Take 1 tablet (500 mg total) by mouth every 6 (six) hours as needed. 30 tablet 0  . amLODipine (NORVASC) 10 MG tablet Take 1 tablet (10 mg total) by mouth daily. 30 tablet 3  . citalopram (CELEXA) 20 MG tablet Take 1 tablet (20 mg total) by mouth daily. 30 tablet 3  . fluticasone (FLONASE) 50 MCG/ACT nasal spray Place 2 sprays into both nostrils daily. 16 g 6  . gabapentin (NEURONTIN) 300 MG capsule Take 1 capsule (300 mg total) by mouth 3 (three) times daily. 90 capsule 3  . hydroxychloroquine (PLAQUENIL) 200 MG tablet Take 1 tablet (200 mg total) by mouth 2 (two) times daily. 60 tablet 3  . loratadine (CLARITIN) 10 MG tablet Take 1 tablet (10 mg total) by mouth daily. 30 tablet 11  . losartan (COZAAR) 50 MG tablet Take 1 tablet (50 mg total) by mouth daily. 30 tablet 3  . meclizine (ANTIVERT) 25 MG tablet Take 1 tablet (25 mg total) by mouth 3 (three) times daily as needed for dizziness. 30 tablet 3  . methocarbamol (ROBAXIN) 500 MG tablet Take 1 tablet (500 mg total) by mouth 2 (two) times daily as needed for muscle spasms. 60 tablet 3   No facility-administered medications prior to visit.    Allergies  Allergen Reactions  . Prochlorperazine Other (See Comments), Swelling and Palpitations    Muscle stiffness Muscle stiffness    ROS Review of Systems  Constitutional: Negative.   HENT: Negative.   Eyes: Negative.   Respiratory: Negative.   Cardiovascular: Negative.   Gastrointestinal: Negative.   Endocrine: Negative.   Genitourinary: Negative.   Musculoskeletal: Negative.   Skin: Negative.   Allergic/Immunologic: Negative.   Neurological: Positive for dizziness (occasional ) and headaches (occasional ).  Hematological: Negative.   Psychiatric/Behavioral: Negative.         Objective:    Physical Exam  Constitutional: She is oriented to person, place, and time. She appears well-developed and well-nourished.  HENT:  Head: Normocephalic and atraumatic.  Eyes: Conjunctivae are normal.  Cardiovascular: Normal rate, regular rhythm, normal heart sounds and intact distal pulses.  Pulmonary/Chest: Effort normal and breath sounds normal.  Abdominal: Soft. Bowel sounds are normal.  Musculoskeletal:        General: Normal range of motion.     Cervical back: Normal range of motion and neck supple.  Neurological: She is alert and oriented to person, place, and time. She has normal reflexes.  Skin: Skin is warm and dry.  Psychiatric: She has a normal mood and affect. Her behavior  is normal. Judgment and thought content normal.  Nursing note and vitals reviewed.   BP (!) 156/114 Comment: manual  Pulse 96   Temp 97.8 F (36.6 C)   Ht 6\' 1"  (1.854 m)   Wt 210 lb (95.3 kg)   SpO2 99%   BMI 27.71 kg/m  Wt Readings from Last 3 Encounters:  08/19/19 210 lb (95.3 kg)  08/12/19 207 lb 9.6 oz (94.2 kg)  05/28/19 208 lb (94.3 kg)     Health Maintenance Due  Topic Date Due  . PAP SMEAR-Modifier  09/27/1991  . INFLUENZA VACCINE  03/09/2019    There are no preventive care reminders to display for this patient.  Lab Results  Component Value Date   TSH 1.190 10/12/2018   Lab Results  Component Value Date   WBC 6.6 10/12/2018   HGB 11.2 10/12/2018   HCT 33.7 (L) 10/12/2018   MCV 86 10/12/2018   PLT 277 10/12/2018   Lab Results  Component Value Date   NA 136 10/12/2018   K 4.1 10/12/2018   CO2 20 10/12/2018   GLUCOSE 72 10/12/2018   BUN 9 10/12/2018   CREATININE 0.97 10/12/2018   BILITOT 0.3 10/12/2018   ALKPHOS 92 10/12/2018   AST 16 10/12/2018   ALT 11 10/12/2018   PROT 8.0 10/12/2018   ALBUMIN 5.1 (H) 10/12/2018   CALCIUM 9.6 10/12/2018   ANIONGAP 4 (L) 10/05/2018   Lab Results  Component Value Date   CHOL 226 (H) 10/12/2018   Lab  Results  Component Value Date   HDL 74 10/12/2018   Lab Results  Component Value Date   LDLCALC 119 (H) 10/12/2018   Lab Results  Component Value Date   TRIG 167 (H) 10/12/2018   Lab Results  Component Value Date   CHOLHDL 3.1 10/12/2018   Lab Results  Component Value Date   HGBA1C 4.9 05/28/2019      Assessment & Plan:   1. Hypertensive urgency Blood pressures elevated, which patient r/t increased stress and anxiety. Clonidine 0.3 mg given in office today. Blood pressure tabilized. We will increase Losartan to 100 mg daily today,and she will continue Amlodipine as prescribed. She will report to ED if she eperiences severe headaches, confusion, seizures, double vision, and blurred vision, nausea and vomiting. - cloNIDine (CATAPRES) tablet 0.3 mg  2. Essential hypertension - amLODipine (NORVASC) 10 MG tablet; Take 1 tablet (10 mg total) by mouth daily.  Dispense: 30 tablet; Refill: 6  3. Anxiety Moderate. She will keep appointment with Psychiatry.   4. Healthcare maintenance  5. Follow up She will follow up in 2 weeks.   Meds ordered this encounter  Medications  . cloNIDine (CATAPRES) tablet 0.1 mg  . losartan (COZAAR) 100 MG tablet    Sig: Take 1 tablet (100 mg total) by mouth daily.    Dispense:  30 tablet    Refill:  3  . amLODipine (NORVASC) 10 MG tablet    Sig: Take 1 tablet (10 mg total) by mouth daily.    Dispense:  30 tablet    Refill:  6    No orders of the defined types were placed in this encounter.   Referral Orders  No referral(s) requested today    05/30/2019,  MSN, FNP-BC [facility] [address] [place], [place] [phone] fax   Problem List Items Addressed This Visit    None  No orders of the defined types were placed in this encounter.   Follow-up: No follow-ups on file.    Azzie Glatter, FNP

## 2019-09-02 ENCOUNTER — Emergency Department (HOSPITAL_COMMUNITY)
Admission: EM | Admit: 2019-09-02 | Discharge: 2019-09-02 | Disposition: A | Discharge status: Home / Self Care | Payer: Self-pay | Attending: Emergency Medicine | Admitting: Emergency Medicine

## 2019-09-02 ENCOUNTER — Other Ambulatory Visit: Payer: Self-pay

## 2019-09-02 ENCOUNTER — Emergency Department (HOSPITAL_COMMUNITY): Payer: Self-pay

## 2019-09-02 ENCOUNTER — Encounter (HOSPITAL_COMMUNITY): Payer: Self-pay

## 2019-09-02 DIAGNOSIS — Z79899 Other long term (current) drug therapy: Secondary | ICD-10-CM | POA: Insufficient documentation

## 2019-09-02 DIAGNOSIS — I10 Essential (primary) hypertension: Secondary | ICD-10-CM | POA: Insufficient documentation

## 2019-09-02 LAB — BASIC METABOLIC PANEL
Anion gap: 9 (ref 5–15)
BUN: 15 mg/dL (ref 6–20)
CO2: 27 mmol/L (ref 22–32)
Calcium: 9.2 mg/dL (ref 8.9–10.3)
Chloride: 103 mmol/L (ref 98–111)
Creatinine, Ser: 0.94 mg/dL (ref 0.44–1.00)
GFR calc Af Amer: >60 mL/min (ref >60)
GFR calc non Af Amer: >60 mL/min (ref >60)
Glucose, Bld: 119 mg/dL — ABNORMAL HIGH (ref 70–99)
Potassium: 3.1 mmol/L — ABNORMAL LOW (ref 3.5–5.1)
Sodium: 139 mmol/L (ref 135–145)

## 2019-09-02 LAB — I-STAT BETA HCG BLOOD, ED (NOT ORDERABLE): I-stat hCG, quantitative: <5 m[IU]/mL (ref <5)

## 2019-09-02 LAB — CBC
HCT: 41.9 % (ref 36.0–46.0)
Hemoglobin: 13.8 g/dL (ref 12.0–15.0)
MCH: 28.3 pg (ref 26.0–34.0)
MCHC: 32.9 g/dL (ref 30.0–36.0)
MCV: 86 fL (ref 80.0–100.0)
Platelets: 231 10*3/uL (ref 150–400)
RBC: 4.87 MIL/uL (ref 3.87–5.11)
RDW: 11.9 % (ref 11.5–15.5)
WBC: 7.7 10*3/uL (ref 4.0–10.5)
nRBC: 0 % (ref 0.0–0.2)

## 2019-09-02 LAB — TROPONIN I (HIGH SENSITIVITY): Troponin I (High Sensitivity): 3 ng/L (ref <18)

## 2019-09-02 MED ORDER — SODIUM CHLORIDE 0.9% FLUSH: 3.0000 mL | Freq: Once | INTRAVENOUS | Status: DC

## 2019-09-02 MED ORDER — LABETALOL HCL 5 MG/ML IV SOLN
20.0000 mg | Freq: Once | INTRAVENOUS | Status: AC
  Administered 2019-09-02: 20 mg via INTRAVENOUS
  Filled 2019-09-02: qty 4

## 2019-09-02 MED ORDER — POTASSIUM CHLORIDE CRYS ER 20 MEQ PO TBCR
40.0000 meq | EXTENDED_RELEASE_TABLET | Freq: Once | ORAL | Status: AC
  Administered 2019-09-02: 40 meq via ORAL
  Filled 2019-09-02: qty 2

## 2019-09-02 MED ORDER — ACETAMINOPHEN 500 MG PO TABS
1000.0000 mg | ORAL_TABLET | Freq: Once | ORAL | Status: AC
  Administered 2019-09-02: 1000 mg via ORAL
  Filled 2019-09-02: qty 2

## 2019-09-02 MED ORDER — METOPROLOL TARTRATE 50 MG PO TABS: 50.0000 mg | ORAL_TABLET | Freq: Two times a day (BID) | ORAL | 0 refills | Status: DC

## 2019-09-02 NOTE — ED Notes (Signed)
Pt now describes chest pain as a constant pressure "just a little bit, but its still there."  Will continue to monitor.

## 2019-09-02 NOTE — ED Triage Notes (Signed)
Pt states that she was at a disability appointment today, and was sent here for BP 170/130. Pt states that she has had some CP, as well as headache.  Denies cough, V/D.

## 2019-09-02 NOTE — ED Notes (Signed)
When asked about pts ongoing chest pain, pt reports "its fine."  When asked again to please describe chest pain, pt reports "its fine, its gone."

## 2019-09-02 NOTE — Discharge Instructions (Addendum)
Follow-up with your doctor this week for recheck. 

## 2019-09-02 NOTE — ED Notes (Signed)
Pt provided with graham crackers, peanut butter, and ginger ale, per her request.

## 2019-09-02 NOTE — ED Provider Notes (Signed)
[facility] DEPT Provider Note   CSN: [id number] Arrival date & time: 09/02/19  1311     History Chief Complaint  Patient presents with  . Chest Pain  . Hypertension    Crystal Winters is a 49 y.o. female.  Patient came in the hospital for headache.  Patient has high blood pressure and she has been taking her medicine.  The history is provided by the patient. No language interpreter was used.  Hypertension This is a recurrent problem. The current episode started more than 2 days ago. The problem occurs constantly. The problem has not changed since onset.Pertinent negatives include no chest pain, no abdominal pain and no headaches. Nothing aggravates the symptoms. She has tried nothing for the symptoms. The treatment provided no relief.       Past Medical History:  Diagnosis Date  . Anemia   . Back pain   . Diverticulitis   . H/O total vaginal hysterectomy 20 years ago  . Hypertension   . Hypocalcemia   . Lupus ([place])   . Nausea   . Proteinuria 05/2019  . Vertigo     Patient Active Problem List   Diagnosis Date Noted  . Seasonal allergies 05/08/2019  . Breast pain, left 02/19/2019  . Anxiety 02/19/2019  . Insomnia 02/19/2019  . Elevated blood pressure reading with diagnosis of hypertension 02/19/2019  . Exacerbation of systemic lupus ([place]) 12/25/2018  . 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 Hemorrhage of colon following colonoscopy 11/07/2018  . Acute blood loss anemia   . Acute GI bleeding 10/02/2018  . ARF (acute renal failure) ([place]) 10/02/2018  . Lower GI bleed 09/24/2018  . Essential hypertension 09/24/2018  . Syncope 09/24/2018  . Lupus ([place]) 09/24/2018  . Hypokalemia 09/24/2018    Past Surgical History:  Procedure Laterality Date  . ABDOMINAL HYSTERECTOMY    . BREAST REDUCTION SURGERY    . CHOLECYSTECTOMY    . COLONOSCOPY WITH PROPOFOL N/A 09/27/2018   Procedure: COLONOSCOPY WITH  PROPOFOL;  Surgeon: Wonda Horner, MD;  Location: [facility];  Service: Endoscopy;  Laterality: N/A;  . FLEXIBLE SIGMOIDOSCOPY N/A 10/03/2018   Procedure: FLEXIBLE SIGMOIDOSCOPY;  Surgeon: Otis Brace, MD;  Location: [facility];  Service: Gastroenterology;  Laterality: N/A;  . TOE SURGERY Right   . TUBAL LIGATION       OB History    Gravida  4   Para      Term      Preterm      AB      Living  3     SAB      TAB      Ectopic      Multiple      Live Births              Family History  Problem Relation Age of Onset  . Rectal cancer Other   . Hypertension Other   . Diabetes Other   . Breast cancer Other   . Colon cancer Other   . Prostate cancer Other   . Stroke Other   . CAD Other   . Coronary artery disease Other     Social History   Tobacco Use  . Smoking status: Never Smoker  . Smokeless tobacco: Never Used  Substance Use Topics  . Alcohol use: Not Currently  . Drug use: Never    Home Medications Prior to Admission medications   Medication Sig Start Date End Date Taking?  Authorizing Provider  acetaminophen (TYLENOL) 500 MG tablet Take 1 tablet (500 mg total) by mouth every 6 (six) hours as needed. 05/15/19   Lorelee New, PA-C  amLODipine (NORVASC) 10 MG tablet Take 1 tablet (10 mg total) by mouth daily. 08/19/19   [name], FNP  citalopram (CELEXA) 20 MG tablet Take 1 tablet (20 mg total) by mouth daily. 06/24/19   [name], FNP  fluticasone (FLONASE) 50 MCG/ACT nasal spray Place 2 sprays into both nostrils daily. 05/06/19   [name], FNP  gabapentin (NEURONTIN) 300 MG capsule Take 1 capsule (300 mg total) by mouth 3 (three) times daily. 11/07/18   [name], FNP  hydroxychloroquine (PLAQUENIL) 200 MG tablet Take 1 tablet (200 mg total) by mouth 2 (two) times daily. 10/12/18   [name], FNP  loratadine (CLARITIN) 10 MG tablet Take 1 tablet (10 mg total) by mouth daily. 05/06/19   [name],  FNP  losartan (COZAAR) 100 MG tablet Take 1 tablet (100 mg total) by mouth daily. 08/19/19   [name], FNP  meclizine (ANTIVERT) 25 MG tablet Take 1 tablet (25 mg total) by mouth 3 (three) times daily as needed for dizziness. 10/12/18   [name], FNP  methocarbamol (ROBAXIN) 500 MG tablet Take 1 tablet (500 mg total) by mouth 2 (two) times daily as needed for muscle spasms. 08/12/19   [name], FNP  metoprolol tartrate (LOPRESSOR) 50 MG tablet Take 1 tablet (50 mg total) by mouth 2 (two) times daily. 09/02/19   Bethann Berkshire, MD  traZODone (DESYREL) 150 MG tablet Take 1 tablet (150 mg total) by mouth at bedtime. 07/10/19 08/12/19  [name], FNP    Allergies    Prochlorperazine  Review of Systems   Review of Systems  Constitutional: Negative for appetite change and fatigue.  HENT: Negative for congestion, ear discharge and sinus pressure.   Eyes: Negative for discharge.  Respiratory: Negative for cough.   Cardiovascular: Negative for chest pain.  Gastrointestinal: Negative for abdominal pain and diarrhea.  Genitourinary: Negative for frequency and hematuria.  Musculoskeletal: Negative for back pain.  Skin: Negative for rash.  Neurological: Negative for seizures and headaches.  Psychiatric/Behavioral: Negative for hallucinations.    Physical Exam Updated Vital Signs BP (!) 156/100   Pulse 67   Temp 99 F (37.2 C) (Oral)   Resp 16   SpO2 97%   Physical Exam Vitals and nursing note reviewed.  Constitutional:      Appearance: She is well-developed.  HENT:     Head: Normocephalic.     Nose: Nose normal.  Eyes:     General: No scleral icterus.    Conjunctiva/sclera: Conjunctivae normal.  Neck:     Thyroid: No thyromegaly.  Cardiovascular:     Rate and Rhythm: Normal rate and regular rhythm.     Heart sounds: No murmur. No friction rub. No gallop.   Pulmonary:     Breath sounds: No stridor. No wheezing or rales.  Chest:     Chest wall: No  tenderness.  Abdominal:     General: There is no distension.     Tenderness: There is no abdominal tenderness. There is no rebound.  Musculoskeletal:        General: Normal range of motion.     Cervical back: Neck supple.  Lymphadenopathy:     Cervical: No cervical adenopathy.  Skin:    Findings: No erythema or rash.  Neurological:  Mental Status: She is alert and oriented to person, place, and time.     Motor: No abnormal muscle tone.     Coordination: Coordination normal.  Psychiatric:        Behavior: Behavior normal.     ED Results / Procedures / Treatments   Labs (all labs ordered are listed, but only abnormal results are displayed) Labs Reviewed  BASIC METABOLIC PANEL - Abnormal; Notable for the following components:      Result Value   Potassium 3.1 (*)    Glucose, Bld 119 (*)    All other components within normal limits  CBC  I-STAT BETA HCG BLOOD, ED ([facility], [facility], [facility] ONLY)  I-STAT BETA HCG BLOOD, ED (NOT ORDERABLE)  TROPONIN I (HIGH SENSITIVITY)    EKG None  Radiology DG Chest 2 View  Result Date: 09/02/2019 CLINICAL DATA:  Chest pain and hypertension. EXAM: CHEST - 2 VIEW COMPARISON:  Chest x-ray dated August 17, 2018. FINDINGS: The heart size and mediastinal contours are within normal limits. Both lungs are clear. The visualized skeletal structures are unremarkable. IMPRESSION: No active cardiopulmonary disease. Electronically Signed   By: Obie Dredge M.D.   On: 09/02/2019 13:57    Procedures Procedures (including critical care time)  Medications Ordered in ED Medications  sodium chloride flush (NS) 0.9 % injection 3 mL (has no administration in time range)  potassium chloride SA (KLOR-CON) CR tablet 40 mEq (40 mEq Oral Given 09/02/19 1449)  labetalol (NORMODYNE) injection 20 mg (20 mg Intravenous Given 09/02/19 1456)  acetaminophen (TYLENOL) tablet 1,000 mg (1,000 mg Oral Given 09/02/19 1449)    ED Course  I have reviewed the triage vital signs and  the nursing notes.  Pertinent labs & imaging results that were available during my care of the patient were reviewed by me and considered in my medical decision making (see chart for details).    MDM Rules/Calculators/A&P                      Patient with poorly controlled high blood pressure.  She is on the maximum dose of Norvasc and losartan.  We will place her on some Lopressor and have her follow-up with her doctor Final Clinical Impression(s) / ED Diagnoses Final diagnoses:  Essential hypertension    Rx / DC Orders ED Discharge Orders         Ordered    metoprolol tartrate (LOPRESSOR) 50 MG tablet  2 times daily     09/02/19 1638           Bethann Berkshire, MD 09/02/19 1642

## 2019-09-04 ENCOUNTER — Ambulatory Visit: Payer: Medicaid Other | Admitting: Family Medicine

## 2019-09-16 ENCOUNTER — Ambulatory Visit: Payer: Medicaid Other | Admitting: Family Medicine

## 2019-09-20 ENCOUNTER — Other Ambulatory Visit: Payer: Self-pay

## 2019-09-20 ENCOUNTER — Emergency Department (HOSPITAL_COMMUNITY)
Admission: EM | Admit: 2019-09-20 | Discharge: 2019-09-20 | Disposition: A | Discharge status: Home / Self Care | Payer: Self-pay | Attending: Emergency Medicine | Admitting: Emergency Medicine

## 2019-09-20 ENCOUNTER — Emergency Department (HOSPITAL_COMMUNITY): Payer: Self-pay

## 2019-09-20 DIAGNOSIS — Z23 Encounter for immunization: Secondary | ICD-10-CM | POA: Insufficient documentation

## 2019-09-20 DIAGNOSIS — I10 Essential (primary) hypertension: Secondary | ICD-10-CM | POA: Insufficient documentation

## 2019-09-20 DIAGNOSIS — S91332A Puncture wound without foreign body, left foot, initial encounter: Secondary | ICD-10-CM | POA: Insufficient documentation

## 2019-09-20 DIAGNOSIS — Y999 Unspecified external cause status: Secondary | ICD-10-CM | POA: Insufficient documentation

## 2019-09-20 DIAGNOSIS — Z79899 Other long term (current) drug therapy: Secondary | ICD-10-CM | POA: Insufficient documentation

## 2019-09-20 DIAGNOSIS — W458XXA Other foreign body or object entering through skin, initial encounter: Secondary | ICD-10-CM | POA: Insufficient documentation

## 2019-09-20 DIAGNOSIS — Y939 Activity, unspecified: Secondary | ICD-10-CM | POA: Insufficient documentation

## 2019-09-20 DIAGNOSIS — Y92019 Unspecified place in single-family (private) house as the place of occurrence of the external cause: Secondary | ICD-10-CM | POA: Insufficient documentation

## 2019-09-20 MED ORDER — OXYCODONE-ACETAMINOPHEN 5-325 MG PO TABS: 1.0000 | ORAL_TABLET | Freq: Three times a day (TID) | ORAL | 0 refills | Status: DC | PRN

## 2019-09-20 MED ORDER — AMLODIPINE BESYLATE 5 MG PO TABS
10.0000 mg | ORAL_TABLET | Freq: Once | ORAL | Status: AC
  Administered 2019-09-20: 09:00:00 10 mg via ORAL
  Filled 2019-09-20: qty 2

## 2019-09-20 MED ORDER — CEPHALEXIN 500 MG PO CAPS: 500.0000 mg | ORAL_CAPSULE | Freq: Four times a day (QID) | ORAL | 0 refills | Status: DC

## 2019-09-20 MED ORDER — TETANUS-DIPHTH-ACELL PERTUSSIS 5-2.5-18.5 LF-MCG/0.5 IM SUSP
0.5000 mL | Freq: Once | INTRAMUSCULAR | Status: AC
  Administered 2019-09-20: 09:00:00 0.5 mL via INTRAMUSCULAR
  Filled 2019-09-20: qty 0.5

## 2019-09-20 MED ORDER — OXYCODONE-ACETAMINOPHEN 5-325 MG PO TABS
1.0000 | ORAL_TABLET | Freq: Once | ORAL | Status: AC
  Administered 2019-09-20: 09:00:00 1 via ORAL
  Filled 2019-09-20: qty 1

## 2019-09-20 NOTE — ED Triage Notes (Signed)
Patient reports she stepped on a screw on patio door on 09/18/2019. Pain rated 7/10. Patient has pea sized puncture wound to bottom of left foot, close to great toe.

## 2019-09-20 NOTE — ED Provider Notes (Signed)
[facility] DEPT Provider Note   CSN: [id number] Arrival date & time: 09/20/19  0751     History Chief Complaint  Patient presents with  . Foot Injury    left foot, stepped on screw    Crystal Winters is a 49 y.o. female with a past medical history of hypertension, lupus, who presents to ED for puncture wound to her left foot that occurred on 09/18/2019.  States that she was walking in her home when she stepped on a screw on the ground.  She was barefoot when this happened.  States that she cleaned the area with "something in the first aid kit," but she did not use alcohol, soap and water on the area since the injury occurred.  States that she has been putting weight on the other parts of her foot so she has pain in these areas.  She has not been taking any medications to help with the pain.  Denies any prior fracture, dislocation or procedure in the area, fever, redness or drainage from the area.  She is unsure of her last tetanus.  HPI     Past Medical History:  Diagnosis Date  . Anemia   . Back pain   . Diverticulitis   . H/O total vaginal hysterectomy 20 years ago  . Hypertension   . Hypocalcemia   . Lupus ([place])   . Nausea   . Proteinuria 05/2019  . Vertigo     Patient Active Problem List   Diagnosis Date Noted  . Seasonal allergies 05/08/2019  . Breast pain, left 02/19/2019  . Anxiety 02/19/2019  . Insomnia 02/19/2019  . Elevated blood pressure reading with diagnosis of hypertension 02/19/2019  . Exacerbation of systemic lupus ([place]) 12/25/2018  . 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 Hemorrhage of colon following colonoscopy 11/07/2018  . Acute blood loss anemia   . Acute GI bleeding 10/02/2018  . ARF (acute renal failure) ([place]) 10/02/2018  . Lower GI bleed 09/24/2018  . Essential hypertension 09/24/2018  . Syncope 09/24/2018  . Lupus ([place]) 09/24/2018  . Hypokalemia 09/24/2018    Past Surgical  History:  Procedure Laterality Date  . ABDOMINAL HYSTERECTOMY    . BREAST REDUCTION SURGERY    . CHOLECYSTECTOMY    . COLONOSCOPY WITH PROPOFOL N/A 09/27/2018   Procedure: COLONOSCOPY WITH PROPOFOL;  Surgeon: Wonda Horner, MD;  Location[facility];  Service: Endoscopy;  Laterality: N/A;  . FLEXIBLE SIGMOIDOSCOPY N/A 10/03/2018   Procedure: FLEXIBLE SIGMOIDOSCOPY;  Surgeon: Otis Brace, MD;  Location: [facility];  Service: Gastroenterology;  Laterality: N/A;  . TOE SURGERY Right   . TUBAL LIGATION       OB History    Gravida  4   Para      Term      Preterm      AB      Living  3     SAB      TAB      Ectopic      Multiple      Live Births              Family History  Problem Relation Age of Onset  . Rectal cancer Other   . Hypertension Other   . Diabetes Other   . Breast cancer Other   . Colon cancer Other   . Prostate cancer Other   . Stroke Other   . CAD Other   . Coronary artery disease  Other     Social History   Tobacco Use  . Smoking status: Never Smoker  . Smokeless tobacco: Never Used  Substance Use Topics  . Alcohol use: Not Currently  . Drug use: Never    Home Medications Prior to Admission medications   Medication Sig Start Date End Date Taking? Authorizing Provider  acetaminophen (TYLENOL) 500 MG tablet Take 1 tablet (500 mg total) by mouth every 6 (six) hours as needed. 05/15/19   Corena Herter, PA-C  amLODipine (NORVASC) 10 MG tablet Take 1 tablet (10 mg total) by mouth daily. 08/19/19   Azzie Glatter, FNP  cephALEXin (KEFLEX) 500 MG capsule Take 1 capsule (500 mg total) by mouth 4 (four) times daily. 09/20/19   Jeromiah Ohalloran, PA-C  citalopram (CELEXA) 20 MG tablet Take 1 tablet (20 mg total) by mouth daily. 06/24/19   Azzie Glatter, FNP  fluticasone (FLONASE) 50 MCG/ACT nasal spray Place 2 sprays into both nostrils daily. 05/06/19   Azzie Glatter, FNP  gabapentin (NEURONTIN) 300 MG capsule Take 1 capsule (300  mg total) by mouth 3 (three) times daily. 11/07/18   Azzie Glatter, FNP  hydroxychloroquine (PLAQUENIL) 200 MG tablet Take 1 tablet (200 mg total) by mouth 2 (two) times daily. 10/12/18   Azzie Glatter, FNP  loratadine (CLARITIN) 10 MG tablet Take 1 tablet (10 mg total) by mouth daily. 05/06/19   Azzie Glatter, FNP  losartan (COZAAR) 100 MG tablet Take 1 tablet (100 mg total) by mouth daily. 08/19/19   Azzie Glatter, FNP  meclizine (ANTIVERT) 25 MG tablet Take 1 tablet (25 mg total) by mouth 3 (three) times daily as needed for dizziness. 10/12/18   Azzie Glatter, FNP  methocarbamol (ROBAXIN) 500 MG tablet Take 1 tablet (500 mg total) by mouth 2 (two) times daily as needed for muscle spasms. 08/12/19   Azzie Glatter, FNP  metoprolol tartrate (LOPRESSOR) 50 MG tablet Take 1 tablet (50 mg total) by mouth 2 (two) times daily. 09/02/19   Milton Ferguson, MD  oxyCODONE-acetaminophen (PERCOCET/ROXICET) 5-325 MG tablet Take 1 tablet by mouth every 8 (eight) hours as needed for severe pain. 09/20/19   Prajna Vanderpool, PA-C  QUEtiapine (SEROQUEL) 25 MG tablet Take 25 mg by mouth at bedtime. 09/03/19   [provider]  sertraline (ZOLOFT) 50 MG tablet take 1/2 by mouth tablet daily for 7 days, then take 1 tablet by mouth daily 09/03/19   [provider]  tiZANidine (ZANAFLEX) 4 MG tablet Take 4 mg by mouth every 8 (eight) hours as needed for muscle spasms. 09/03/19   [provider]  traZODone (DESYREL) 150 MG tablet Take 1 tablet (150 mg total) by mouth at bedtime. 07/10/19 08/12/19  Azzie Glatter, FNP    Allergies    Prochlorperazine  Review of Systems   Review of Systems  Constitutional: Negative for appetite change, chills and fever.  HENT: Negative for ear pain, rhinorrhea, sneezing and sore throat.   Eyes: Negative for photophobia and visual disturbance.  Respiratory: Negative for cough, chest tightness, shortness of breath and wheezing.   Cardiovascular: Negative  for chest pain and palpitations.  Gastrointestinal: Negative for abdominal pain, blood in stool, constipation, diarrhea, nausea and vomiting.  Genitourinary: Negative for dysuria, hematuria and urgency.  Musculoskeletal: Negative for myalgias.  Skin: Positive for wound. Negative for rash.  Neurological: Negative for dizziness, weakness and light-headedness.    Physical Exam Updated Vital Signs BP (!) 187/115   Pulse  82   Temp 98.9 F (37.2 C) (Oral)   Resp 15   Ht 6' (1.829 m)   Wt 91.6 kg   SpO2 97%   BMI 27.40 kg/m   Physical Exam Vitals and nursing note reviewed.  Constitutional:      General: She is not in acute distress.    Appearance: She is well-developed.  HENT:     Head: Normocephalic and atraumatic.     Nose: Nose normal.  Eyes:     General: No scleral icterus.       Left eye: No discharge.     Conjunctiva/sclera: Conjunctivae normal.  Cardiovascular:     Rate and Rhythm: Normal rate and regular rhythm.     Heart sounds: Normal heart sounds. No murmur. No friction rub. No gallop.   Pulmonary:     Effort: Pulmonary effort is normal. No respiratory distress.     Breath sounds: Normal breath sounds.  Abdominal:     General: Bowel sounds are normal. There is no distension.     Palpations: Abdomen is soft.     Tenderness: There is no abdominal tenderness. There is no guarding.  Musculoskeletal:        General: Normal range of motion.     Cervical back: Normal range of motion and neck supple.       Feet:     Comments: FROM of digits of bilateral feet and ankles. Sensation intact to light touch of L toes. 2+ DP pulse noted bilaterally. No active bleeding noted.  Skin:    General: Skin is warm and dry.     Findings: Wound present. No rash.     Comments: Puncture wound noted to foot. No surrounding erythema, drainage or warmth noted.  Neurological:     Mental Status: She is alert.     Motor: No abnormal muscle tone.     Coordination: Coordination normal.      ED Results / Procedures / Treatments   Labs (all labs ordered are listed, but only abnormal results are displayed) Labs Reviewed - No data to display  EKG None  Radiology DG Foot Complete Left  Result Date: 09/20/2019 CLINICAL DATA:  Puncture wound. EXAM: LEFT FOOT - COMPLETE 3+ VIEW COMPARISON:  None. FINDINGS: There is no evidence of fracture or dislocation. There is no evidence of arthropathy or other focal bone abnormality. Soft tissues are unremarkable. No radiopaque foreign body is noted. IMPRESSION: Negative. Electronically Signed   By: Marijo Conception M.D.   On: 09/20/2019 08:44    Procedures Procedures (including critical care time)  Medications Ordered in ED Medications  oxyCODONE-acetaminophen (PERCOCET/ROXICET) 5-325 MG per tablet 1 tablet (1 tablet Oral Given 09/20/19 0834)  Tdap (BOOSTRIX) injection 0.5 mL (0.5 mLs Intramuscular Given 09/20/19 0835)  amLODipine (NORVASC) tablet 10 mg (10 mg Oral Given 09/20/19 8676)    ED Course  I have reviewed the triage vital signs and the nursing notes.  Pertinent labs & imaging results that were available during my care of the patient were reviewed by me and considered in my medical decision making (see chart for details).    MDM Rules/Calculators/A&P                      49 year old female presenting to the ED after puncture wound to left foot that occurred 2 days ago.  States that she stepped on a screw barefoot on her flooring at home.  She reports pain at the area and has  not thoroughly cleaned the area since it happened.  She is unsure of her last tetanus.  On exam there is a puncture wound noted to the bottom of the left foot at the ball of the foot.  Areas neurovascularly intact.  Good pulses noted.  She remains ambulatory.  She has normal range of motion of the digits and ankle.  X-rays negative for foreign body or injury.  I was able to irrigate the area thoroughly and update her tetanus.  Due to history of this  happening and no proper irrigation after it happened and feel that she will benefit from antibiotic prophylaxis.  She is agreeable to taking antibiotics and given instructions regarding wound care.  Will have her return for worsening symptoms.  Patient is hemodynamically stable, in NAD, and able to ambulate in the ED. Evaluation does not show pathology that would require ongoing emergent intervention or inpatient treatment. I explained the diagnosis to the patient. Pain has been managed and has no complaints prior to discharge. Patient is comfortable with above plan and is stable for discharge at this time. All questions were answered prior to disposition. Strict return precautions for returning to the ED were discussed. Encouraged follow up with PCP.   An After Visit Summary was printed and given to the patient.   Portions of this note were generated with [employer]. Dictation errors may occur despite best attempts at proofreading.  Final Clinical Impression(s) / ED Diagnoses Final diagnoses:  Puncture wound of left foot, initial encounter    Rx / DC Orders ED Discharge Orders         Ordered    cephALEXin (KEFLEX) 500 MG capsule  4 times daily     09/20/19 0915    oxyCODONE-acetaminophen (PERCOCET/ROXICET) 5-325 MG tablet  Every 8 hours PRN     09/20/19 0915           Delia Heady, PA-C 09/20/19 0917    Maudie Flakes, MD 09/24/19 [phone]

## 2019-09-20 NOTE — Discharge Instructions (Signed)
It is important for you to take the antibiotics as prescribed.  Complete the entire course of antibiotics regardless of symptom improvement to prevent worsening or recurrence of your infection. Take the pain medicine as needed. Return to the ED if you start to develop signs of infection including redness, drainage, fever or swelling around the area.

## 2019-09-25 ENCOUNTER — Other Ambulatory Visit: Payer: Self-pay

## 2019-09-25 ENCOUNTER — Encounter: Payer: Self-pay | Admitting: Internal Medicine

## 2019-09-25 ENCOUNTER — Ambulatory Visit: Payer: Medicaid Other | Admitting: Internal Medicine

## 2019-09-25 VITALS — BP 170/100 | HR 72 | Resp 12 | Ht 72.0 in | Wt 203.0 lb

## 2019-09-25 DIAGNOSIS — K5731 Diverticulosis of large intestine without perforation or abscess with bleeding: Secondary | ICD-10-CM

## 2019-09-25 DIAGNOSIS — Z0000 Encounter for general adult medical examination without abnormal findings: Secondary | ICD-10-CM

## 2019-09-25 DIAGNOSIS — G8929 Other chronic pain: Secondary | ICD-10-CM

## 2019-09-25 DIAGNOSIS — I10 Essential (primary) hypertension: Secondary | ICD-10-CM

## 2019-09-25 DIAGNOSIS — M545 Low back pain: Secondary | ICD-10-CM

## 2019-09-25 MED ORDER — LOSARTAN POTASSIUM 100 MG PO TABS: 100.0000 mg | ORAL_TABLET | Freq: Every day | ORAL | 11 refills | Status: DC

## 2019-09-25 MED ORDER — AMLODIPINE BESYLATE 10 MG PO TABS: 10.0000 mg | ORAL_TABLET | Freq: Every day | ORAL | 11 refills | Status: DC

## 2019-09-25 MED ORDER — HYDROCHLOROTHIAZIDE 25 MG PO TABS: 25.0000 mg | ORAL_TABLET | Freq: Every day | ORAL | 11 refills | Status: DC

## 2019-09-25 MED ORDER — METOPROLOL TARTRATE 50 MG PO TABS: 50.0000 mg | ORAL_TABLET | Freq: Two times a day (BID) | ORAL | 11 refills | Status: DC

## 2019-09-25 NOTE — Progress Notes (Addendum)
Subjective:    Patient ID: Crystal Winters, female   DOB: 1971/03/11, 49 y.o.   MRN: [id number]   HPI   Here to establish:  1.  SLE:  Diagnosed about 7 years ago.  Kidneys and joints most affected.  Follows with Dr. Annabelle Harman, Rheumatology,  at [facility] since October 2020.  Prior, followed with a rheumatologist in [place], [place] for about 1 year prior.  Also followed in [place], [place] by Rheumatology at one point.  Has been taking Hydroxychloroquine for 6 + years.  She is not sure it controls her disease well.  Feels she awakens with a lot of pain--mainly related to her back.   2.  Low back pain:  Bilateral for many years.  Sometimes has pain radiating down left leg--sharp and the leg goes numb.  Points to the lateral thigh and leg.  States also has knots on her lateral thigh that hurt--bilateral.  Has been told she has bursitis.   She has had corticosteroids injections into both thighs in past 4 months or so with Dr. Annabelle Harman.  No improvement with those.  Was diagnosed in October with greater trochanteric bursitis.   No history of injury. Xrays of L/S spine 09/02/19 showed mild DDD and facet OA    3.  Diverticular bleed with presyncope for which she was hospitalized twice this time last year 09/2018.  Never found source, though CT suggested this diagnosis and after second hospitalization and transfusion, bleeding did not recur.   4.  Hypertension:  Diagnosed at age 23 yo.   Taking Metoprolol 50 mg twice, Losartan 50 mg and Amlodipine 10 mg daily.  Does not need refills.  Does not miss her meds.   5.  Depression/PTSD/Anxiety2:  Being treated at [facility].  Taking Citalopram 20 mg, Sertraline added more recently and has increased to 50 mg daily.  Was switched to Seroquel 25 mg at bedtime from Trazodone as she has difficulty sleeping.  Was started on newer meds end of January and has follow up with Dr. Christella Hartigan first week of March.  Current Meds  Medication Sig  . acetaminophen (TYLENOL) 500 MG tablet Take 1  tablet (500 mg total) by mouth every 6 (six) hours as needed.  Marland Kitchen amLODipine (NORVASC) 10 MG tablet Take 1 tablet (10 mg total) by mouth daily.  . cephALEXin (KEFLEX) 500 MG capsule Take 1 capsule (500 mg total) by mouth 4 (four) times daily.  . citalopram (CELEXA) 20 MG tablet Take 1 tablet (20 mg total) by mouth daily.  . fluticasone (FLONASE) 50 MCG/ACT nasal spray Place 2 sprays into both nostrils daily.  Marland Kitchen gabapentin (NEURONTIN) 300 MG capsule Take 1 capsule (300 mg total) by mouth 3 (three) times daily.  . hydroxychloroquine (PLAQUENIL) 200 MG tablet Take 1 tablet (200 mg total) by mouth 2 (two) times daily.  Marland Kitchen loratadine (CLARITIN) 10 MG tablet Take 1 tablet (10 mg total) by mouth daily.  Marland Kitchen losartan (COZAAR) 100 MG tablet Take 1 tablet (100 mg total) by mouth daily.  . meclizine (ANTIVERT) 25 MG tablet Take 1 tablet (25 mg total) by mouth 3 (three) times daily as needed for dizziness.  . methocarbamol (ROBAXIN) 500 MG tablet Take 1 tablet (500 mg total) by mouth 2 (two) times daily as needed for muscle spasms.  . metoprolol tartrate (LOPRESSOR) 50 MG tablet Take 1 tablet (50 mg total) by mouth 2 (two) times daily.  Marland Kitchen oxyCODONE-acetaminophen (PERCOCET/ROXICET) 5-325 MG tablet Take 1 tablet by mouth every 8 (eight) hours  as needed for severe pain.  Marland Kitchen QUEtiapine (SEROQUEL) 25 MG tablet Take 25 mg by mouth at bedtime.  . sertraline (ZOLOFT) 50 MG tablet take 1/2 by mouth tablet daily for 7 days, then take 1 tablet by mouth daily  . tiZANidine (ZANAFLEX) 4 MG tablet Take 4 mg by mouth every 8 (eight) hours as needed for muscle spasms.   Allergies  Allergen Reactions  . Prochlorperazine Other (See Comments), Swelling and Palpitations    Muscle stiffness Muscle stiffness     Review of Systems    Objective:   BP (!) 170/100 (BP Location: Left Arm, Patient Position: Sitting, Cuff Size: Normal)   Pulse 72   Resp 12   Ht 6' (1.829 m)   Wt 203 lb (92.1 kg)   BMI 27.53 kg/m   Physical  Exam   NAD, though tearful at times discussing health situation and trauma from childhood. HEENT:  PERRL, EOMI Neck:  Supple, No adenopathy, no thyromegaly Chest:  CTA CV:  RRR with normal S1 and S2, No S3, S4 or murmur.  No carotid bruits.  Carotid, radial and DP pulses normal and equal Abd:  S NT, No HSM or mass, + BS Back:  Mild tenderness over L/S spinous processes and paraspinous musculature. Neuro/LE:  Motor 5/5 and DTRs 2+/4 throughout.  Gait normal. LE:  No edema.   Assessment & Plan  1.  SLE:  Will check her records.  Do not see recent labs in Care Everywhere.  She did not sign a release of info for records from [place].  2.  Hypertension:  Not well controlled.  Add HCTZ 25 mg daily to Metoprolol, Amlodipine, Losartan.  Concerned her anxiety may be adding to her elevated bp Repeat bp check in 1 month with BMP.    3.  Low back pain:  Xrays from [facility] in Care Everywhere show mild lumbar DDD and facet OA.  Has not had PT for this.  Referral to [facility] clinic.  3.  Diverticular bleed 1 year ago--will need to further review charts  No history of actual diverticulitis.  No source of bleed actually viewed on sigmoidoscopy or colonoscopy.  Diverticulosis on CT scan of abdomen.  4.  Depression/anxiety/PTSD:  She is on two SSRIs and Seroquel at bedtime.  Has not tried Mirtazepine previously.  Discussed drawback of increased appetite with this medication, but may cover many of her issues:  Depression and insomnia.   Also discussed asking counselor to consider EMDR/CBT for PTSD as well. She will start working with Hanley Seamen, MSW intern next week  5.  Food insecurity/problems with finances:  Alcario Drought will work with CHWs finding sources for support with both--possible referral to [facility] regarding the latter.

## 2019-09-25 NOTE — Patient Instructions (Addendum)
Check with Vesta Mixer regarding [employer] on site.  Check on EMDR, CBT for PTSD  Check on moving to Mirtazepine at bedtime to replace Sertraline, Citalopram and Seroquel.

## 2019-09-30 ENCOUNTER — Ambulatory Visit: Payer: Medicaid Other | Attending: Family

## 2019-09-30 DIAGNOSIS — Z23 Encounter for immunization: Secondary | ICD-10-CM | POA: Insufficient documentation

## 2019-09-30 NOTE — Progress Notes (Signed)
   Covid-19 Vaccination Clinic  Name:  Crystal Winters    MRN: [id number] DOB: 1971/07/24  09/30/2019  [name] was observed post Covid-19 immunization for 15 minutes without incidence. She was provided with Vaccine Information Sheet and instruction to access the V-Safe system.   [name] was instructed to call 911 with any severe reactions post vaccine: Marland Kitchen Difficulty breathing  . Swelling of your face and throat  . A fast heartbeat  . A bad rash all over your body  . Dizziness and weakness    Immunizations Administered    Name Date Dose VIS Date Route   Moderna COVID-19 Vaccine 09/30/2019  9:51 AM 0.5 mL 07/09/2019 Intramuscular   Manufacturer: Moderna   Lot: [id number]   NDC: 92119-417-40

## 2019-10-02 ENCOUNTER — Other Ambulatory Visit: Payer: Medicaid Other | Admitting: Internal Medicine

## 2019-10-10 ENCOUNTER — Telehealth: Payer: Self-pay

## 2019-10-10 NOTE — Clinical Social Work Note (Signed)
Social worker called client to schedule appointment. Appointment was scheduled.

## 2019-10-10 NOTE — Clinical Social Work Note (Signed)
Social worker called client to schedule appointment. Client did not answer, so social worker left message offering for client to return call.

## 2019-10-10 NOTE — Telephone Encounter (Signed)
10/03/2019 Social worker called client to schedule appointment. Client did not answer, so social worker left message offering for client to return call. 

## 2019-10-10 NOTE — Telephone Encounter (Signed)
10/09/2019 Social worker called client to schedule appointment. Appointment was scheduled.

## 2019-10-16 ENCOUNTER — Other Ambulatory Visit: Payer: Self-pay

## 2019-10-16 ENCOUNTER — Ambulatory Visit: Payer: Medicaid Other | Admitting: Internal Medicine

## 2019-10-16 DIAGNOSIS — F439 Reaction to severe stress, unspecified: Secondary | ICD-10-CM
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
## 2019-10-22 ENCOUNTER — Telehealth: Payer: Self-pay | Admitting: Clinical

## 2019-10-23 ENCOUNTER — Other Ambulatory Visit: Payer: Medicaid Other | Admitting: Internal Medicine

## 2019-10-23 ENCOUNTER — Other Ambulatory Visit: Payer: Self-pay

## 2019-10-23 VITALS — BP 170/107 | HR 70

## 2019-10-23 DIAGNOSIS — I10 Essential (primary) hypertension: Secondary | ICD-10-CM

## 2019-10-23 NOTE — Progress Notes (Signed)
Patient came in for BP check. BP still running high and patient on 4 different BP medications. Patient states she is under a lot of stress. Per Dr. Delrae Alfred have patient to monitor her BP at home for next 2 weeks and call in with BP readings. States if still running high then she will adjust her medication. Patient verbalized understanding. No changes in meds today.

## 2019-10-23 NOTE — Addendum Note (Signed)
Addended by: Marcelino Freestone on: 10/23/2019 02:17 PM   Modules accepted: Orders

## 2019-10-23 NOTE — Addendum Note (Signed)
Addended by: Marcelino Freestone on: 10/23/2019 06:12 PM   Modules accepted: Orders

## 2019-10-24 LAB — BASIC METABOLIC PANEL
BUN/Creatinine Ratio: 15 (ref 9–23)
BUN: 15 mg/dL (ref 6–24)
CO2: 24 mmol/L (ref 20–29)
Calcium: 9.8 mg/dL (ref 8.7–10.2)
Chloride: 102 mmol/L (ref 96–106)
Creatinine, Ser: 1.03 mg/dL — ABNORMAL HIGH (ref 0.57–1.00)
GFR calc Af Amer: 74 mL/min/{1.73_m2} (ref >59)
GFR calc non Af Amer: 64 mL/min/{1.73_m2} (ref >59)
Glucose: 98 mg/dL (ref 65–99)
Potassium: 4 mmol/L (ref 3.5–5.2)
Sodium: 138 mmol/L (ref 134–144)

## 2019-10-30 ENCOUNTER — Other Ambulatory Visit: Payer: Medicaid Other | Admitting: Clinical

## 2019-10-30 ENCOUNTER — Telehealth: Payer: Self-pay | Admitting: Clinical

## 2019-11-04 NOTE — Telephone Encounter (Signed)
No phone call made to patient on this day.

## 2019-11-05 ENCOUNTER — Ambulatory Visit: Payer: Medicaid Other | Attending: Family

## 2019-11-05 DIAGNOSIS — Z23 Encounter for immunization: Secondary | ICD-10-CM

## 2019-11-05 NOTE — Progress Notes (Signed)
   Covid-19 Vaccination Clinic  Name:  Crystal Winters    MRN: [id number] DOB: [birth year]  11/05/2019  Crystal Winters was observed post Covid-19 immunization for 15 minutes without incident. She was provided with Vaccine Information Sheet and instruction to access the V-Safe system.   Crystal Winters was instructed to call 911 with any severe reactions post vaccine: Marland Kitchen Difficulty breathing  . Swelling of face and throat  . A fast heartbeat  . A bad rash all over body  . Dizziness and weakness   Immunizations Administered    Name Date Dose VIS Date Route   Moderna COVID-19 Vaccine 11/05/2019 10:16 AM 0.5 mL 07/09/2019 Intramuscular   Manufacturer: Moderna   Lot: [id number]   NDC: 44315-400-86

## 2019-11-06 ENCOUNTER — Telehealth: Payer: Self-pay | Admitting: Clinical

## 2019-11-06 NOTE — Telephone Encounter (Signed)
Social worker called patient to reschedule missed appointment. Patient did not answer.

## 2019-11-06 NOTE — Telephone Encounter (Signed)
Social worker called patient to reschedule appointment. Appointment was rescheduled. 

## 2019-11-06 NOTE — Telephone Encounter (Signed)
Social worker called patient due to patient missing appointment. Patient did not answer, so social worker left message requesting that patient return call.

## 2019-11-13 ENCOUNTER — Telehealth: Payer: Self-pay

## 2019-11-13 ENCOUNTER — Other Ambulatory Visit: Payer: Medicaid Other | Admitting: Clinical

## 2019-11-13 NOTE — Telephone Encounter (Signed)
Patient returned social worker call. Patient requested to reschedule appointment. Appointment was rescheduled.

## 2019-11-13 NOTE — Telephone Encounter (Signed)
Social worker called to confirm appointment. Client did not answer, so social worker left message inviting client to return call.

## 2019-11-19 ENCOUNTER — Ambulatory Visit: Payer: Medicaid Other | Admitting: Family Medicine

## 2019-11-20 ENCOUNTER — Telehealth: Payer: Self-pay

## 2019-11-20 ENCOUNTER — Ambulatory Visit (INDEPENDENT_AMBULATORY_CARE_PROVIDER_SITE_OTHER): Payer: Self-pay | Admitting: Clinical

## 2019-11-20 ENCOUNTER — Other Ambulatory Visit: Payer: Self-pay

## 2019-11-20 DIAGNOSIS — F439 Reaction to severe stress, unspecified: Secondary | ICD-10-CM

## 2019-11-20 HISTORY — DX: Reaction to severe stress, unspecified: F43.9
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
## 2019-11-20 NOTE — Telephone Encounter (Signed)
Social worker called patient to confirm appointment. Appointment was confirmed. 

## 2019-11-20 NOTE — Progress Notes (Deleted)
   THERAPY PROGRESS NOTE  Session Time:  Participation Level: Active  Behavioral Response: {Appearance:22683}{BHH LEVEL OF CONSCIOUSNESS:22305}{BHH MOOD:22306}  Type of Therapy: {CHL AMB BH Type of Therapy:21022741}  Treatment Goals addressed: {CHL AMB BH Treatment Goals Addressed:21022754}  Interventions: {CHL AMB BH Type of Intervention:21022753}  Summary: Crystal Winters is a 49 y.o. female who presents with ***.   Suicidal/Homicidal: {BHH YES OR NO:22294}{yes/no/with/without intent/plan:22693}  Therapist Response: ***  Plan: Return again in *** weeks.  Diagnosis: Axis I: {psych axis 1:31909}    Axis II: {psych axis 2:31910}    Paula Libra, Student-Social Work 11/20/2019

## 2019-11-27 ENCOUNTER — Telehealth: Payer: Self-pay

## 2019-11-29 ENCOUNTER — Other Ambulatory Visit: Payer: Self-pay

## 2019-11-29 ENCOUNTER — Ambulatory Visit: Payer: Self-pay | Admitting: Clinical

## 2019-11-29 DIAGNOSIS — F439 Reaction to severe stress, unspecified: Secondary | ICD-10-CM

## 2019-11-29 NOTE — Telephone Encounter (Signed)
Social worker called patient to confirm appointment. Appointment was confirmed. 
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
## 2019-12-05 ENCOUNTER — Other Ambulatory Visit: Payer: Medicaid Other

## 2019-12-10 ENCOUNTER — Other Ambulatory Visit (INDEPENDENT_AMBULATORY_CARE_PROVIDER_SITE_OTHER): Payer: Self-pay

## 2019-12-10 ENCOUNTER — Encounter: Payer: Self-pay | Admitting: Internal Medicine

## 2019-12-10 ENCOUNTER — Other Ambulatory Visit: Payer: Self-pay

## 2019-12-10 DIAGNOSIS — R7989 Other specified abnormal findings of blood chemistry: Secondary | ICD-10-CM

## 2019-12-10 LAB — POCT URINALYSIS DIPSTICK
Bilirubin, UA: NEGATIVE
Blood, UA: NEGATIVE
Glucose, UA: NEGATIVE
Ketones, UA: NEGATIVE
Leukocytes, UA: NEGATIVE
Nitrite, UA: NEGATIVE
Protein, UA: NEGATIVE
Spec Grav, UA: 1.02 (ref 1.010–1.025)
Urobilinogen, UA: 0.2 E.U./dL
pH, UA: 6.5 (ref 5.0–8.0)

## 2019-12-11 LAB — BASIC METABOLIC PANEL
BUN/Creatinine Ratio: 14 (ref 9–23)
BUN: 13 mg/dL (ref 6–24)
CO2: 22 mmol/L (ref 20–29)
Calcium: 9 mg/dL (ref 8.7–10.2)
Chloride: 103 mmol/L (ref 96–106)
Creatinine, Ser: 0.91 mg/dL (ref 0.57–1.00)
GFR calc Af Amer: 86 mL/min/{1.73_m2} (ref >59)
GFR calc non Af Amer: 74 mL/min/{1.73_m2} (ref >59)
Glucose: 94 mg/dL (ref 65–99)
Potassium: 3.5 mmol/L (ref 3.5–5.2)
Sodium: 140 mmol/L (ref 134–144)

## 2019-12-13 ENCOUNTER — Other Ambulatory Visit: Payer: Medicaid Other | Admitting: Clinical

## 2019-12-27 ENCOUNTER — Ambulatory Visit: Payer: Medicaid Other | Admitting: Internal Medicine

## 2019-12-27 ENCOUNTER — Encounter: Payer: Self-pay | Admitting: Internal Medicine

## 2019-12-27 VITALS — BP 180/120 | HR 62 | Resp 12 | Ht 72.0 in | Wt 199.0 lb

## 2019-12-27 DIAGNOSIS — E010 Iodine-deficiency related diffuse (endemic) goiter: Secondary | ICD-10-CM

## 2019-12-27 DIAGNOSIS — R233 Spontaneous ecchymoses: Secondary | ICD-10-CM

## 2019-12-27 DIAGNOSIS — M5442 Lumbago with sciatica, left side: Secondary | ICD-10-CM

## 2019-12-27 DIAGNOSIS — K029 Dental caries, unspecified: Secondary | ICD-10-CM

## 2019-12-27 DIAGNOSIS — D649 Anemia, unspecified: Secondary | ICD-10-CM

## 2019-12-27 DIAGNOSIS — M549 Dorsalgia, unspecified: Secondary | ICD-10-CM | POA: Insufficient documentation

## 2019-12-27 DIAGNOSIS — E785 Hyperlipidemia, unspecified: Secondary | ICD-10-CM | POA: Insufficient documentation

## 2019-12-27 DIAGNOSIS — R238 Other skin changes: Secondary | ICD-10-CM

## 2019-12-27 DIAGNOSIS — E782 Mixed hyperlipidemia: Secondary | ICD-10-CM

## 2019-12-27 DIAGNOSIS — G8929 Other chronic pain: Secondary | ICD-10-CM

## 2019-12-27 DIAGNOSIS — I10 Essential (primary) hypertension: Secondary | ICD-10-CM

## 2019-12-27 DIAGNOSIS — Z0000 Encounter for general adult medical examination without abnormal findings: Secondary | ICD-10-CM

## 2019-12-27 DIAGNOSIS — M329 Systemic lupus erythematosus, unspecified: Secondary | ICD-10-CM

## 2019-12-27 DIAGNOSIS — Z79899 Other long term (current) drug therapy: Secondary | ICD-10-CM

## 2019-12-27 HISTORY — DX: Iodine-deficiency related diffuse (endemic) goiter: E01.0

## 2019-12-27 HISTORY — DX: Dental caries, unspecified: K02.9

## 2019-12-27 HISTORY — DX: Mixed hyperlipidemia: E78.2

## 2019-12-27 MED ORDER — DICYCLOMINE HCL 20 MG PO TABS: ORAL_TABLET | ORAL | 11 refills | Status: DC

## 2019-12-27 MED ORDER — TRIAMCINOLONE ACETONIDE 0.1 % EX CREA: TOPICAL_CREAM | CUTANEOUS | 2 refills | Status: DC

## 2019-12-27 NOTE — Patient Instructions (Addendum)
Citrated calcium 500-600 mg twice daily with 200 IU of Vitamin D.    Tucks or witch hazel to use after BMs, followed by Triamcinolone cream if active irritation/swelling.  Metamucil or Citrucel--start with 1/4 or 1/2 dose daily for 2 weeks or until tolerating and gradually increase to full dose for easy to pass stools and abdominal cramping.
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
## 2019-12-28 LAB — CBC WITH DIFFERENTIAL/PLATELET
Basophils Absolute: 0 10*3/uL (ref 0.0–0.2)
Basos: 1 %
EOS (ABSOLUTE): 0.1 10*3/uL (ref 0.0–0.4)
Eos: 1 %
Hematocrit: 45 % (ref 34.0–46.6)
Hemoglobin: 15 g/dL (ref 11.1–15.9)
Immature Grans (Abs): 0 10*3/uL (ref 0.0–0.1)
Immature Granulocytes: 0 %
Lymphocytes Absolute: 1.6 10*3/uL (ref 0.7–3.1)
Lymphs: 28 %
MCH: 28.4 pg (ref 26.6–33.0)
MCHC: 33.3 g/dL (ref 31.5–35.7)
MCV: 85 fL (ref 79–97)
Monocytes Absolute: 0.3 10*3/uL (ref 0.1–0.9)
Monocytes: 5 %
Neutrophils Absolute: 3.9 10*3/uL (ref 1.4–7.0)
Neutrophils: 65 %
Platelets: 241 10*3/uL (ref 150–450)
RBC: 5.29 x10E6/uL — ABNORMAL HIGH (ref 3.77–5.28)
RDW: 12.8 % (ref 11.7–15.4)
WBC: 5.9 10*3/uL (ref 3.4–10.8)

## 2019-12-28 LAB — LIPID PANEL W/O CHOL/HDL RATIO
Cholesterol, Total: 229 mg/dL — ABNORMAL HIGH (ref 100–199)
HDL: 72 mg/dL
LDL Chol Calc (NIH): 140 mg/dL — ABNORMAL HIGH (ref 0–99)
Triglycerides: 99 mg/dL (ref 0–149)
VLDL Cholesterol Cal: 17 mg/dL (ref 5–40)

## 2019-12-28 LAB — COMPREHENSIVE METABOLIC PANEL
ALT: 11 IU/L (ref 0–32)
AST: 21 IU/L (ref 0–40)
Albumin/Globulin Ratio: 1.3 (ref 1.2–2.2)
Albumin: 4.6 g/dL (ref 3.8–4.8)
Alkaline Phosphatase: 115 IU/L (ref 48–121)
BUN/Creatinine Ratio: 10 (ref 9–23)
BUN: 10 mg/dL (ref 6–24)
Bilirubin Total: 0.5 mg/dL (ref 0.0–1.2)
CO2: 24 mmol/L (ref 20–29)
Calcium: 9.6 mg/dL (ref 8.7–10.2)
Chloride: 99 mmol/L (ref 96–106)
Creatinine, Ser: 1 mg/dL (ref 0.57–1.00)
GFR calc Af Amer: 76 mL/min/{1.73_m2} (ref >59)
GFR calc non Af Amer: 66 mL/min/{1.73_m2} (ref >59)
Globulin, Total: 3.5 g/dL (ref 1.5–4.5)
Glucose: 83 mg/dL (ref 65–99)
Potassium: 4 mmol/L (ref 3.5–5.2)
Sodium: 138 mmol/L (ref 134–144)
Total Protein: 8.1 g/dL (ref 6.0–8.5)

## 2019-12-28 LAB — APTT: aPTT: 30 s (ref 24–33)

## 2019-12-28 LAB — PROTIME-INR
INR: 1.1 (ref 0.9–1.2)
Prothrombin Time: 11.4 s (ref 9.1–12.0)

## 2019-12-28 LAB — TSH: TSH: 1.36 u[IU]/mL (ref 0.450–4.500)

## 2019-12-28 LAB — T4, FREE: Free T4: 1.12 ng/dL (ref 0.82–1.77)

## 2020-01-03 ENCOUNTER — Encounter: Payer: Self-pay | Admitting: Internal Medicine

## 2020-01-07 ENCOUNTER — Other Ambulatory Visit: Payer: Medicaid Other | Admitting: Clinical

## 2020-01-08 ENCOUNTER — Telehealth: Payer: Self-pay | Admitting: Clinical

## 2020-01-08 NOTE — Telephone Encounter (Signed)
LCSW returned call as she left VM to cancel 06/01 appt. LCSW left message to return call if she would like to reschedule.

## 2020-01-10 ENCOUNTER — Ambulatory Visit (INDEPENDENT_AMBULATORY_CARE_PROVIDER_SITE_OTHER): Payer: Self-pay

## 2020-01-10 VITALS — BP 170/120 | HR 58

## 2020-01-10 DIAGNOSIS — Z1211 Encounter for screening for malignant neoplasm of colon: Secondary | ICD-10-CM

## 2020-01-10 DIAGNOSIS — I10 Essential (primary) hypertension: Secondary | ICD-10-CM

## 2020-01-10 LAB — POC HEMOCCULT BLD/STL (HOME/3-CARD/SCREEN)
Card #2 Fecal Occult Blod, POC: NEGATIVE
Card #3 Fecal Occult Blood, POC: NEGATIVE
Fecal Occult Blood, POC: NEGATIVE

## 2020-01-10 NOTE — Progress Notes (Signed)
Patient came in for BP and pulse check. States she had left all her medication in her hotel in [place]. States she just got medication back yesterday. Informed ill have her come back in 1 week to check it again since she had been off medicine for almost 2 weeks. Patient verbalized understanding.

## 2020-01-16 ENCOUNTER — Telehealth: Payer: Self-pay | Admitting: Clinical

## 2020-01-16 NOTE — Telephone Encounter (Signed)
LCSW contacted patient as a reminder for session after BP with nurse, patient agreed.

## 2020-01-17 ENCOUNTER — Ambulatory Visit: Payer: Self-pay

## 2020-01-17 ENCOUNTER — Ambulatory Visit: Payer: Self-pay | Admitting: Clinical

## 2020-01-17 VITALS — BP 130/88 | HR 58

## 2020-01-17 DIAGNOSIS — I10 Essential (primary) hypertension: Secondary | ICD-10-CM

## 2020-01-17 DIAGNOSIS — F439 Reaction to severe stress, unspecified: Secondary | ICD-10-CM

## 2020-01-17 NOTE — Progress Notes (Signed)
Patient BP now in normal range. Informed patient to continue current dose of medication. Patient verbalized understanding 
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
## 2020-01-22 ENCOUNTER — Other Ambulatory Visit: Payer: Self-pay

## 2020-01-22 ENCOUNTER — Ambulatory Visit: Payer: Self-pay | Admitting: Clinical

## 2020-01-22 DIAGNOSIS — F4310 Post-traumatic stress disorder, unspecified: Secondary | ICD-10-CM

## 2020-01-23 NOTE — Progress Notes (Signed)
   THERAPY PROGRESS NOTE  Session Time: 10-11am Participation Level: Active Behavioral Response: CasualAlertCooperative Type of Therapy: Individual Therapy Treatment Goals addressed: Anger and Coping with symptom.    Purpose: LCSW met with client for routine individual therapy to work towards treatment goals: reducing self harm behaviors due to anger and learning coping skills for self care and trauma.   Intervention: LCSW  assessed for any significant events and assessed how they are doing today since last seen. LCSW utilized intervention of CBT and DBT to decrease anxiety, trauma and anger symptoms. LCSW provided reflective listening as patient processed how she is feeling today. In this session LCSW and patient began to process some trauma events from childhood and its effect on current relationship.   LCSW provided patient with DBT skill STOP and provided handout of the coping skill as she practiced it and encouraged to list out her coping skills to distress. LCSW assessed for SI/HI/command psychosis.  Effectiveness: Patient is alert x4 affect. Patient identified she is feeling stressed only due to upcoming event she is organizing but it is something she is looking forward too. Patient participated in session sharing updates with her fiance feeling confused due to his response regarding marriage compared to her thinking of it. Patient identified fear of being hurt due to past experiences. Patient shared she has difficulty adjusting to affection, upon processing it was explored patient didn't receive affection growing up and learned to hold a strong/survival attitude to not show weakness. Patient identified strategy to discuss with fiance about possibility of compromises such as talking in the morning and leaving night time for sleep as he tends to want to talk and discuss further with him about his expression of anger. Patient is beginning to explore the idea of premarital counseling with her  fiance help ease her confusion of marriage.  Patient processed being triggered recently and it leading to anger; patient shared in the past her anger has led to doing self harm (physical- as a teenager cutting). Patient shared she was able to let her dog comfort her and practiced breathing. This intervention was able to reduce her risk of expressing her anger as she has done in the past.    Intervention was effective as patient was able to reflect and process trauma and her feelings on this date. Progress towards goal is Ongoing. Patient denied active suicidal/homicidal/active psychosis.  Plan Patient offered next appointment for: 06/23 10am  Diagnosis: Post Traumatic Stress Disorder    Lujean Rave, LCSW 01/23/2020

## 2020-01-29 ENCOUNTER — Other Ambulatory Visit: Payer: Medicaid Other | Admitting: Clinical

## 2020-02-05 ENCOUNTER — Other Ambulatory Visit: Payer: Self-pay

## 2020-02-05 ENCOUNTER — Ambulatory Visit: Payer: Self-pay | Admitting: Clinical

## 2020-02-05 DIAGNOSIS — F4310 Post-traumatic stress disorder, unspecified: Secondary | ICD-10-CM
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
## 2020-02-11 ENCOUNTER — Other Ambulatory Visit: Payer: Medicaid Other | Admitting: Clinical

## 2020-02-12 ENCOUNTER — Encounter (HOSPITAL_COMMUNITY): Payer: Self-pay | Admitting: Psychiatric/Mental Health

## 2020-02-12 ENCOUNTER — Other Ambulatory Visit: Payer: Self-pay

## 2020-02-12 ENCOUNTER — Telehealth: Payer: Self-pay | Admitting: Clinical

## 2020-02-12 ENCOUNTER — Telehealth (INDEPENDENT_AMBULATORY_CARE_PROVIDER_SITE_OTHER): Payer: No Payment, Other | Admitting: Psychiatric/Mental Health

## 2020-02-12 ENCOUNTER — Ambulatory Visit (INDEPENDENT_AMBULATORY_CARE_PROVIDER_SITE_OTHER): Payer: No Payment, Other | Admitting: Clinical

## 2020-02-12 DIAGNOSIS — F4310 Post-traumatic stress disorder, unspecified: Secondary | ICD-10-CM | POA: Insufficient documentation

## 2020-02-12 DIAGNOSIS — F331 Major depressive disorder, recurrent, moderate: Secondary | ICD-10-CM

## 2020-02-12 DIAGNOSIS — F419 Anxiety disorder, unspecified: Secondary | ICD-10-CM | POA: Diagnosis not present

## 2020-02-12 DIAGNOSIS — F411 Generalized anxiety disorder: Secondary | ICD-10-CM | POA: Diagnosis not present

## 2020-02-12 DIAGNOSIS — F329 Major depressive disorder, single episode, unspecified: Secondary | ICD-10-CM

## 2020-02-12 HISTORY — DX: Post-traumatic stress disorder, unspecified: F43.10

## 2020-02-12 HISTORY — DX: Major depressive disorder, single episode, unspecified: F32.9

## 2020-02-12 MED ORDER — BUSPIRONE HCL 10 MG PO TABS: 10.0000 mg | ORAL_TABLET | Freq: Three times a day (TID) | ORAL | 3 refills | Status: DC

## 2020-02-12 MED ORDER — SERTRALINE HCL 100 MG PO TABS: 100.0000 mg | ORAL_TABLET | Freq: Every day | ORAL | 3 refills | Status: DC

## 2020-02-12 MED ORDER — QUETIAPINE FUMARATE 100 MG PO TABS: 100.0000 mg | ORAL_TABLET | Freq: Every day | ORAL | 3 refills | Status: DC

## 2020-02-12 NOTE — Telephone Encounter (Signed)
LCSW contaced patient to reschedule canceled session. LCSW left message with purpose of phone call

## 2020-02-12 NOTE — Progress Notes (Signed)
Psychiatric Initial Adult Assessment   Virtual Visit via Video Note  I connected with [name] on 02/12/20  by a video enabled telemedicine application and verified that I am speaking with the correct person using two identifiers.  Location: Patient: Home Provider: Home office  I discussed the limitations of evaluation and management by telemedicine and the availability of in person appointments. The patient expressed understanding and agreed to proceed.  I provided 45 minutes of non-face-to-face time during this encounter  Patient Identification: Crystal Winters MRN:  [id number] Date of Evaluation:  02/12/2020 Referral Source: Vesta Mixer Chief Complaint:  "Im doing better today" Visit Diagnosis:    ICD-10-CM   1. Moderate episode of recurrent major depressive disorder (HCC)  F33.1 sertraline (ZOLOFT) 100 MG tablet    QUEtiapine (SEROQUEL) 100 MG tablet  2. Anxiety  F41.9 busPIRone (BUSPAR) 10 MG tablet  3. PTSD (post-traumatic stress disorder)  F43.10 QUEtiapine (SEROQUEL) 100 MG tablet  4. GAD (generalized anxiety disorder)  F41.1 busPIRone (BUSPAR) 10 MG tablet    History of Present Illness:  Crystal Winters is a 49  year-old female seen today for initial psych evaluation.  Patient was referred to outpatient psychiatry by [facility] for medication management.  On assessment Crystal Winters reports her mood as "good ".  As for sleep patient states "it has been okay, I was prescribed 50 mg of Seroquel to take at bedtime however I have to admit I have taken 100 mg at bedtime and not only did I get a better night sleep but it also improved my mood during the day ".  Patient is currently prescribed 30 mg of BuSpar daily, Seroquel 50 mg at bedtime and Zoloft 100 mg daily.  Writer has agreed to increase his Seroquel to 100 mg at bedtime for sleep and mood.  Patient has no further concerns or complaints at this time.  Patient denies SI, HI and AVH.  Patient has agreed to follow-up in 12 weeks.  No further concerns at this  time.  Associated Signs/Symptoms: Depression Symptoms:  na (Hypo) Manic Symptoms:  na Anxiety Symptoms:  na Psychotic Symptoms:  na PTSD Symptoms: NA  Past Psychiatric History: MDD, PTSD, GAD  Previous Psychotropic Medications: Yes   Substance Abuse History in the last 12 months:  No.  Consequences of Substance Abuse: NA  Past Medical History:  Past Medical History:  Diagnosis Date  . Anemia    Whole life  . Back pain    L/S spine with left radiculopathy  . Diverticulosis of colon with hemorrhage 09/24/2018  . Hypertension 1997  . Hypocalcemia   . Lupus (HCC) 2012   Joint complaints, fatigue, soft tissue swelling, maybe kidney involvement.    . Proteinuria 05/2019  . Vertigo     Past Surgical History:  Procedure Laterality Date  . BREAST REDUCTION SURGERY  2007  . CHOLECYSTECTOMY  1997  . COLONOSCOPY WITH PROPOFOL N/A 09/27/2018   Procedure: COLONOSCOPY WITH PROPOFOL;  Surgeon: Graylin Shiver, MD;  Location: [facility];  Service: Endoscopy;  Laterality: N/A;  . FLEXIBLE SIGMOIDOSCOPY N/A 10/03/2018   Procedure: FLEXIBLE SIGMOIDOSCOPY;  Surgeon: Kathi Der, MD;  Location: [facility];  Service: Gastroenterology;  Laterality: N/A;  . TOE SURGERY Right    Bunionectomy  . TUBAL LIGATION  1992  . VAGINAL HYSTERECTOMY  1999    Family Psychiatric History: unknown  Family History:  Family History  Problem Relation Age of Onset  . Hepatitis C Father   . Kidney disease Father  Not sure if due to SLE or if he has SLE  . Lung disease Father        has had 1/2 lung removed for unknown reason  . Thyroid disease Father        Sounds more like hyperthyroidism--can't gain weight.  . Rectal cancer Other   . Hypertension Other   . Diabetes Other   . Breast cancer Other   . Colon cancer Other   . Prostate cancer Other   . Stroke Other   . CAD Other   . Coronary artery disease Other   . Lupus Sister   . Asthma Son   . Kidney disease Paternal Aunt         Likely due to SLE  . Lupus Paternal Aunt   . Kidney disease Paternal Grandmother        kidney disease--not clear if had SLE    Social History:   Social History   Socioeconomic History  . Marital status: Media planner    Spouse name: Divorced  . Number of children: 3  . Years of education: Not on file  . Highest education level: Bachelor's degree (e.g., BA, AB, BS)  Occupational History  . Occupation: Mental Health Professional  Tobacco Use  . Smoking status: Never Smoker  . Smokeless tobacco: Never Used  Vaping Use  . Vaping Use: Never used  Substance and Sexual Activity  . Alcohol use: Yes    Comment: Wine occasionally  . Drug use: Never  . Sexual activity: Yes    Birth control/protection: Surgical  Other Topics Concern  . Not on file  Social History Narrative   Working on [employer] in [employer] at home with fiance.   2 dogs, Criss Alvine and Duke   Social Determinants of Health   Financial Resource Strain: Low Risk   . Difficulty of Paying Living Expenses: Not hard at all  Food Insecurity: No Food Insecurity  . Worried About Programme researcher, broadcasting/film/video in the Last Year: Never true  . Ran Out of Food in the Last Year: Never true  Transportation Needs: No Transportation Needs  . Lack of Transportation (Medical): No  . Lack of Transportation (Non-Medical): No  Physical Activity:   . Days of Exercise per Week:   . Minutes of Exercise per Session:   Stress: Stress Concern Present  . Feeling of Stress : Very much  Social Connections:   . Frequency of Communication with Friends and Family:   . Frequency of Social Gatherings with Friends and Family:   . Attends Religious Services:   . Active Member of Clubs or Organizations:   . Attends Banker Meetings:   Marland Kitchen Marital Status:     Additional Social History: unknown  Allergies:   Allergies  Allergen Reactions  . Prochlorperazine Other (See Comments), Swelling and Palpitations    Muscle  stiffness Muscle stiffness    Metabolic Disorder Labs: Lab Results  Component Value Date   HGBA1C 4.9 05/28/2019   No results found for: PROLACTIN Lab Results  Component Value Date   CHOL 229 (H) 12/27/2019   TRIG 99 12/27/2019   HDL 72 12/27/2019   CHOLHDL 3.1 10/12/2018   LDLCALC 140 (H) 12/27/2019   LDLCALC 119 (H) 10/12/2018   Lab Results  Component Value Date   TSH 1.360 12/27/2019    Therapeutic Level Labs: No results found for: LITHIUM No results found for: CBMZ No results found for: VALPROATE  Current Medications: Current Outpatient  Medications  Medication Sig Dispense Refill  . acetaminophen (TYLENOL) 500 MG tablet Take 1 tablet (500 mg total) by mouth every 6 (six) hours as needed. 30 tablet 0  . amLODipine (NORVASC) 10 MG tablet Take 1 tablet (10 mg total) by mouth daily. 30 tablet 11  . busPIRone (BUSPAR) 10 MG tablet Take 1 tablet (10 mg total) by mouth 3 (three) times daily. 90 tablet 3  . dicyclomine (BENTYL) 20 MG tablet 1 tab by mouth every 6 hours as needed for bloating 30 tablet 11  . fluticasone (FLONASE) 50 MCG/ACT nasal spray Place 2 sprays into both nostrils daily. 16 g 6  . hydrochlorothiazide (HYDRODIURIL) 25 MG tablet Take 1 tablet (25 mg total) by mouth daily. 30 tablet 11  . hydroxychloroquine (PLAQUENIL) 200 MG tablet Take 1 tablet (200 mg total) by mouth 2 (two) times daily. 60 tablet 3  . loratadine (CLARITIN) 10 MG tablet Take 1 tablet (10 mg total) by mouth daily. 30 tablet 11  . losartan (COZAAR) 100 MG tablet Take 1 tablet (100 mg total) by mouth daily. 30 tablet 11  . meclizine (ANTIVERT) 25 MG tablet Take 1 tablet (25 mg total) by mouth 3 (three) times daily as needed for dizziness. 30 tablet 3  . methocarbamol (ROBAXIN) 500 MG tablet Take 1 tablet (500 mg total) by mouth 2 (two) times daily as needed for muscle spasms. (Patient not taking: Reported on 12/27/2019) 60 tablet 3  . metoprolol tartrate (LOPRESSOR) 50 MG tablet Take 1 tablet  (50 mg total) by mouth 2 (two) times daily. 60 tablet 11  . QUEtiapine (SEROQUEL) 100 MG tablet Take 1 tablet (100 mg total) by mouth at bedtime. 30 tablet 3  . sertraline (ZOLOFT) 100 MG tablet Take 1 tablet (100 mg total) by mouth daily. 30 tablet 3  . triamcinolone cream (KENALOG) 0.1 % Apply to affected area twice daily as needed for itching 30 g 2   No current facility-administered medications for this visit.    Musculoskeletal: Strength & Muscle Tone: within normal limits Gait & Station: normal Patient leans: N/A  Psychiatric Specialty Exam: Review of Systems  There were no vitals taken for this visit.There is no height or weight on file to calculate BMI.  General Appearance: Casual  Eye Contact:  Good  Speech:  Clear and Coherent  Volume:  Normal  Mood:  NA  Affect:  Appropriate and Congruent  Thought Process:  Coherent and Descriptions of Associations: Intact  Orientation:  Full (Time, Place, and Person)  Thought Content:  WDL and Logical  Suicidal Thoughts:  No  Homicidal Thoughts:  No  Memory:  Recent;   Fair  Judgement:  Good  Insight:  Good  Psychomotor Activity:  Normal  Concentration:  Attention Span: Good  Recall:  Good  Fund of Knowledge:Good  Language: Good  Akathisia:  NA  Handed:  Right  AIMS (if indicated):  done  Assets:  Communication Skills Desire for Improvement Financial Resources/Insurance Social Support  ADL's:  Intact  Cognition: WNL  Sleep:  Good   Screenings: GAD-7     [facility]  Total GAD-7 Score 18    PHQ2-9     [facility] from 05/06/2019 in [facility]  PHQ-2 Total Score 2 6 1 2  0  PHQ-9 Total Score 10 23 -- 6  --      Assessment and Plan: Patient states that current medication regimen has been working well.  Patient has agreed to continue with BuSpar 30 mg daily, Zoloft 100 mg daily and has agreed to increase Seroquel from 50 mg at bedtime to 100 mg at bedtime.  Patient has agreed to follow-up in 12 weeks.       1. Moderate episode of recurrent major depressive disorder (HCC)  F33.1 sertraline (ZOLOFT) 100 MG tablet    QUEtiapine (SEROQUEL) 100 MG tablet  2. Anxiety  F41.9 busPIRone (BUSPAR) 10 MG tablet  3. PTSD (post-traumatic stress disorder)  F43.10 QUEtiapine (SEROQUEL) 100 MG tablet  4. GAD (generalized anxiety disorder)  F41.1 busPIRone (BUSPAR) 10 MG tablet     , NP 7/7/202111:40 AM

## 2020-02-15 DIAGNOSIS — F331 Major depressive disorder, recurrent, moderate: Secondary | ICD-10-CM | POA: Insufficient documentation
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
## 2020-03-05 ENCOUNTER — Other Ambulatory Visit: Payer: Self-pay

## 2020-03-05 ENCOUNTER — Ambulatory Visit (HOSPITAL_COMMUNITY): Payer: No Payment, Other | Admitting: Clinical

## 2020-04-14 ENCOUNTER — Encounter: Payer: Self-pay | Admitting: Internal Medicine

## 2020-04-14 ENCOUNTER — Ambulatory Visit (INDEPENDENT_AMBULATORY_CARE_PROVIDER_SITE_OTHER): Payer: Self-pay | Admitting: Internal Medicine

## 2020-04-14 VITALS — BP 148/100 | HR 76 | Resp 12 | Ht 72.0 in | Wt 211.0 lb

## 2020-04-14 DIAGNOSIS — R400 Somnolence: Secondary | ICD-10-CM

## 2020-04-14 DIAGNOSIS — R0683 Snoring: Secondary | ICD-10-CM

## 2020-04-14 DIAGNOSIS — G4700 Insomnia, unspecified: Secondary | ICD-10-CM

## 2020-04-14 DIAGNOSIS — R079 Chest pain, unspecified: Secondary | ICD-10-CM

## 2020-04-14 MED ORDER — METOPROLOL TARTRATE 50 MG PO TABS: ORAL_TABLET | ORAL | 11 refills | Status: AC

## 2020-04-14 MED ORDER — NITROGLYCERIN 0.4 MG SL SUBL: 0.4000 mg | SUBLINGUAL_TABLET | SUBLINGUAL | 1 refills | Status: AC | PRN

## 2020-04-14 MED ORDER — ASPIRIN EC 81 MG PO TBEC
81.0000 mg | DELAYED_RELEASE_TABLET | Freq: Every day | ORAL | 11 refills | Status: AC
Start: 2020-04-14 — End: ?

## 2020-04-14 NOTE — Progress Notes (Signed)
Subjective:    Patient ID: Crystal Winters, female   DOB: October 27, 1970, 49 y.o.   MRN: [id number]   HPI   Here for a work in for chest pain over past 2 weeks. Describes a tightness in her mid sternum--uses two fingers to push into her midsternum to describe.  Can be with exertion or at rest.  Happens 3-4 times daily.  There for 10 seconds an then gone.   Has not accelerated in either severity of frequency since the pain started.  She does recall lifting a couple of crates maybe a week or so before the pain started.  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  Cannot recall anything else.  No increased pain with twisting torso or taking a deep breath.   No radiation of the pain.   No associated shortness of breath of nausea.   No orthopnea, but states she has awakened maybe once weekly with sense of choking, and short of breath--has to sit up to catch her breath.  She does snore loudly.  Perhaps pauses in breathing at times.  Has noted the choking episodes since May.  Does describe daytime somnolence. She has noted swelling of her legs that resolves with elevation (normal in the morning) for 3 months on and off.  Limits her sodium intake.  Had an episode of the pain coming into clinic today and obtained ECG which did not show any significant change from ECG in January of this year or from Feb 2020.   She has not showed up for follow up of her bp as expected this summer.  Last nursing visit for follow up she had been off meds as she left them somewhere. States she has not missed her Metoprolol, HCTZ, Losartan, or Amlodipine since she was last seen for nursing visit in June.  Patient also with risk factors of mildly elevated total cholesterol with a high HDL and somewhat elevated LDL Both parents living in there mid to late 60s.  Neither have heart disease, though her mother gets palpitations from time to time.  Sister without heart disease. No history of DM in  patient.   She is overweight somewhat with BMI of almost 29. No tobacco use. Has not had any cardiac stress testing since the mid 1990s when living in [place].    Current Meds  Medication Sig  . amLODipine (NORVASC) 10 MG tablet Take 1 tablet (10 mg total) by mouth daily.  . busPIRone (BUSPAR) 10 MG tablet Take 1 tablet (10 mg total) by mouth 3 (three) times daily.  Marland Kitchen dicyclomine (BENTYL) 20 MG tablet 1 tab by mouth every 6 hours as needed for bloating  . fluticasone (FLONASE) 50 MCG/ACT nasal spray Place 2 sprays into both nostrils daily.  . hydrochlorothiazide (HYDRODIURIL) 25 MG tablet Take 1 tablet (25 mg total) by mouth daily.  . hydroxychloroquine (PLAQUENIL) 200 MG tablet Take 1 tablet (200 mg total) by mouth 2 (two) times daily.  Marland Kitchen loratadine (CLARITIN) 10 MG tablet Take 1 tablet (10 mg total) by mouth daily.  Marland Kitchen losartan (COZAAR) 100 MG tablet Take 1 tablet (100 mg total) by mouth daily.  . meclizine (ANTIVERT) 25 MG tablet Take 1 tablet (25 mg total) by mouth 3 (three) times daily as needed for dizziness.  . methocarbamol (ROBAXIN) 500 MG tablet Take 1 tablet (500 mg total) by mouth 2 (two) times daily as needed for muscle spasms.  . metoprolol tartrate (LOPRESSOR) 50  MG tablet Take 1 tablet (50 mg total) by mouth 2 (two) times daily.  . QUEtiapine (SEROQUEL) 100 MG tablet Take 1 tablet (100 mg total) by mouth at bedtime.  . sertraline (ZOLOFT) 100 MG tablet Take 1 tablet (100 mg total) by mouth daily.  Marland Kitchen triamcinolone cream (KENALOG) 0.1 % Apply to affected area twice daily as needed for itching   Allergies  Allergen Reactions  . Prochlorperazine Other (See Comments), Swelling and Palpitations    Muscle stiffness Muscle stiffness     Review of Systems    Objective:   BP (!) 148/100 (BP Location: Left Arm, Patient Position: Sitting, Cuff Size: Large)   Pulse 76   Resp 12   Ht 6' (1.829 m)   Wt 211 lb (95.7 kg)   BMI 28.62 kg/m   Physical Exam   NAD HEENT:  PERRL, EOMI,  Neck: Supple, No adenopathy, generous thyroid Chest:  CTA NT over sternum and ribs/compression of rib cage CV:  RRR with normal S1 and S2, No S3, S4 or murmur.  No carotid bruits.  Carotid, radial and DP pulses normal and equal. Abd:  S, NT, No HSM or mass, + BS LE:  Trace edema to lower pretib areas bilaterally   Assessment & Plan   1.  Atypical chest pain  Does not sound cardiac by history, but risk with long term history of poorly controlled Hypertension.  Increase Metoprolol to 75 mg twice daily ASA 81 mg daily NTG SL prn--though the pain is quite short lived currently. CXR, CBC, CMP Cardiology referral.  2.  Snoring and awakening choking:  Concern for sleep apnea:  Split night sleep study.  Not sure if she does indeed have OSA how we will find affordable equipment for home use.  3.  Hypertension:  Increasing Metoprolol with bp and pulse check in 1 week.

## 2020-04-15 LAB — CBC WITH DIFFERENTIAL/PLATELET
Basophils Absolute: 0 10*3/uL (ref 0.0–0.2)
Basos: 1 %
EOS (ABSOLUTE): 0.2 10*3/uL (ref 0.0–0.4)
Eos: 3 %
Hematocrit: 39.5 % (ref 34.0–46.6)
Hemoglobin: 12.9 g/dL (ref 11.1–15.9)
Immature Grans (Abs): 0 10*3/uL (ref 0.0–0.1)
Immature Granulocytes: 0 %
Lymphocytes Absolute: 1.5 10*3/uL (ref 0.7–3.1)
Lymphs: 23 %
MCH: 27.6 pg (ref 26.6–33.0)
MCHC: 32.7 g/dL (ref 31.5–35.7)
MCV: 84 fL (ref 79–97)
Monocytes Absolute: 0.4 10*3/uL (ref 0.1–0.9)
Monocytes: 6 %
Neutrophils Absolute: 4.5 10*3/uL (ref 1.4–7.0)
Neutrophils: 67 %
Platelets: 226 10*3/uL (ref 150–450)
RBC: 4.68 x10E6/uL (ref 3.77–5.28)
RDW: 11.6 % — ABNORMAL LOW (ref 11.7–15.4)
WBC: 6.6 10*3/uL (ref 3.4–10.8)

## 2020-04-15 LAB — COMPREHENSIVE METABOLIC PANEL
ALT: 11 IU/L (ref 0–32)
AST: 14 IU/L (ref 0–40)
Albumin/Globulin Ratio: 1.5 (ref 1.2–2.2)
Albumin: 4.3 g/dL (ref 3.8–4.8)
Alkaline Phosphatase: 106 IU/L (ref 48–121)
BUN/Creatinine Ratio: 15 (ref 9–23)
BUN: 14 mg/dL (ref 6–24)
Bilirubin Total: 0.3 mg/dL (ref 0.0–1.2)
CO2: 23 mmol/L (ref 20–29)
Calcium: 9 mg/dL (ref 8.7–10.2)
Chloride: 105 mmol/L (ref 96–106)
Creatinine, Ser: 0.91 mg/dL (ref 0.57–1.00)
GFR calc Af Amer: 86 mL/min/{1.73_m2} (ref >59)
GFR calc non Af Amer: 74 mL/min/{1.73_m2} (ref >59)
Globulin, Total: 2.9 g/dL (ref 1.5–4.5)
Glucose: 128 mg/dL — ABNORMAL HIGH (ref 65–99)
Potassium: 3.8 mmol/L (ref 3.5–5.2)
Sodium: 143 mmol/L (ref 134–144)
Total Protein: 7.2 g/dL (ref 6.0–8.5)

## 2020-04-17 ENCOUNTER — Ambulatory Visit (HOSPITAL_COMMUNITY)
Admission: RE | Admit: 2020-04-17 | Discharge: 2020-04-17 | Disposition: A | Discharge status: Home / Self Care | Payer: Medicaid Other | Source: Ambulatory Visit | Attending: Internal Medicine | Admitting: Internal Medicine

## 2020-04-17 DIAGNOSIS — R079 Chest pain, unspecified: Secondary | ICD-10-CM | POA: Insufficient documentation

## 2020-04-21 ENCOUNTER — Ambulatory Visit: Payer: Self-pay

## 2020-04-21 VITALS — BP 130/96 | HR 80

## 2020-04-21 DIAGNOSIS — I10 Essential (primary) hypertension: Secondary | ICD-10-CM

## 2020-04-22 ENCOUNTER — Other Ambulatory Visit: Payer: Self-pay

## 2020-04-22 ENCOUNTER — Ambulatory Visit (INDEPENDENT_AMBULATORY_CARE_PROVIDER_SITE_OTHER): Payer: Self-pay | Admitting: Internal Medicine

## 2020-04-22 ENCOUNTER — Encounter: Payer: Self-pay | Admitting: Internal Medicine

## 2020-04-22 VITALS — BP 140/100 | HR 68 | Ht 72.0 in | Wt 215.0 lb

## 2020-04-22 DIAGNOSIS — D649 Anemia, unspecified: Secondary | ICD-10-CM

## 2020-04-22 DIAGNOSIS — I10 Essential (primary) hypertension: Secondary | ICD-10-CM

## 2020-04-22 DIAGNOSIS — M3214 Glomerular disease in systemic lupus erythematosus: Secondary | ICD-10-CM

## 2020-04-22 DIAGNOSIS — K922 Gastrointestinal hemorrhage, unspecified: Secondary | ICD-10-CM

## 2020-04-22 DIAGNOSIS — R0602 Shortness of breath: Secondary | ICD-10-CM

## 2020-04-22 MED ORDER — ATORVASTATIN CALCIUM 10 MG PO TABS: 10.0000 mg | ORAL_TABLET | Freq: Every day | ORAL | 3 refills | Status: DC

## 2020-04-22 MED ORDER — ISOSORBIDE MONONITRATE ER 30 MG PO TB24: 30.0000 mg | ORAL_TABLET | Freq: Every day | ORAL | 3 refills | Status: DC
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
## 2020-04-22 NOTE — Progress Notes (Signed)
Patient came in for BP check. BP still running high. States she lost a very close friend the night before due to covid and has her really upset. Informed would speak with Dr. Delrae Alfred and let her know what she wants her to do . Patient verbalized understanding.

## 2020-04-22 NOTE — Patient Instructions (Signed)
Medication Instructions:  Your physician has recommended you make the following change in your medication:   START Isosorbide 30mg  daily START Atorvastatin 10mg  daily  *If you need a refill on your cardiac medications before your next appointment, please call your pharmacy*   Lab Work: TODAY: CBC  If you have labs (blood work) drawn today and your tests are completely normal, you will receive your results only by: MyChart Message (if you have MyChart) OR . A paper copy in the mail If you have any lab test that is abnormal or we need to change your treatment, we will call you to review the results.   Testing/Procedures: Your physician has requested that you have an echocardiogram. Echocardiography is a painless test that uses sound waves to create images of your heart. It provides your doctor with information about the size and shape of your heart and how well your heart's chambers and valves are working. This procedure takes approximately one hour. There are no restrictions for this procedure.     Follow-Up: At [facility], you and your health needs are our priority.  As part of our continuing mission to provide you with exceptional heart care, we have created designated Provider Care Teams.  These Care Teams include your primary Cardiologist (physician) and Advanced Practice Providers (APPs -  Physician Assistants and Nurse Practitioners) who all work together to provide you with the care you need, when you need it.  We recommend signing up for the patient portal called "MyChart".  Sign up information is provided on this After Visit Summary.  MyChart is used to connect with patients for Virtual Visits (Telemedicine).  Patients are able to view lab/test results, encounter notes, upcoming appointments, etc.  Non-urgent messages can be sent to your provider as well.   To learn more about what you can do with MyChart, go to Marland Kitchen.    Your next appointment:   4-5  month follow up  The format for your next appointment:   In Person  Provider:   [facility], MD   Other Instructions None

## 2020-04-24 ENCOUNTER — Other Ambulatory Visit: Payer: Self-pay

## 2020-04-24 ENCOUNTER — Ambulatory Visit (HOSPITAL_COMMUNITY): Payer: Self-pay | Attending: Cardiology

## 2020-04-24 DIAGNOSIS — R0602 Shortness of breath: Secondary | ICD-10-CM | POA: Insufficient documentation

## 2020-04-24 LAB — ECHOCARDIOGRAM COMPLETE
Area-P 1/2: 2.76 cm2
S' Lateral: 2.7 cm

## 2020-04-28 ENCOUNTER — Ambulatory Visit (INDEPENDENT_AMBULATORY_CARE_PROVIDER_SITE_OTHER): Payer: No Payment, Other | Admitting: Clinical

## 2020-04-28 ENCOUNTER — Other Ambulatory Visit: Payer: Self-pay

## 2020-04-28 DIAGNOSIS — F331 Major depressive disorder, recurrent, moderate: Secondary | ICD-10-CM | POA: Diagnosis not present
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
## 2020-05-01 ENCOUNTER — Other Ambulatory Visit (INDEPENDENT_AMBULATORY_CARE_PROVIDER_SITE_OTHER): Payer: Self-pay

## 2020-05-01 VITALS — BP 122/80 | HR 68

## 2020-05-01 DIAGNOSIS — R7303 Prediabetes: Secondary | ICD-10-CM

## 2020-05-01 DIAGNOSIS — I10 Essential (primary) hypertension: Secondary | ICD-10-CM

## 2020-05-01 NOTE — Progress Notes (Signed)
Patient BP in normal range. Informed to continue current dose of medication. Patient verbalized understanding. 

## 2020-05-02 LAB — HGB A1C W/O EAG: Hgb A1c MFr Bld: 4.5 % — ABNORMAL LOW (ref 4.8–5.6)

## 2020-05-05 ENCOUNTER — Other Ambulatory Visit (HOSPITAL_COMMUNITY): Payer: Self-pay | Admitting: Psychiatry

## 2020-05-05 ENCOUNTER — Telehealth (HOSPITAL_COMMUNITY): Payer: Self-pay | Admitting: Psychiatry

## 2020-05-05 DIAGNOSIS — F331 Major depressive disorder, recurrent, moderate: Secondary | ICD-10-CM

## 2020-05-05 DIAGNOSIS — F411 Generalized anxiety disorder: Secondary | ICD-10-CM

## 2020-05-05 DIAGNOSIS — F419 Anxiety disorder, unspecified: Secondary | ICD-10-CM

## 2020-05-05 DIAGNOSIS — F4310 Post-traumatic stress disorder, unspecified: Secondary | ICD-10-CM

## 2020-05-05 MED ORDER — QUETIAPINE FUMARATE 100 MG PO TABS: 100.0000 mg | ORAL_TABLET | Freq: Every day | ORAL | 1 refills | Status: DC

## 2020-05-05 MED ORDER — SERTRALINE HCL 100 MG PO TABS: 100.0000 mg | ORAL_TABLET | Freq: Every day | ORAL | 1 refills | Status: DC

## 2020-05-05 MED ORDER — BUSPIRONE HCL 10 MG PO TABS: 10.0000 mg | ORAL_TABLET | Freq: Three times a day (TID) | ORAL | 1 refills | Status: DC

## 2020-05-05 NOTE — Telephone Encounter (Signed)
Medications reordered and sent to preferred pharmacy.  

## 2020-05-14 ENCOUNTER — Telehealth (HOSPITAL_COMMUNITY): Payer: Self-pay

## 2020-05-23 ENCOUNTER — Other Ambulatory Visit (HOSPITAL_COMMUNITY): Payer: Self-pay | Admitting: Psychiatric/Mental Health

## 2020-05-23 DIAGNOSIS — F331 Major depressive disorder, recurrent, moderate: Secondary | ICD-10-CM

## 2020-05-23 DIAGNOSIS — F4310 Post-traumatic stress disorder, unspecified: Secondary | ICD-10-CM

## 2020-05-26 ENCOUNTER — Telehealth (INDEPENDENT_AMBULATORY_CARE_PROVIDER_SITE_OTHER): Payer: No Payment, Other | Admitting: Psychiatry

## 2020-05-26 ENCOUNTER — Encounter (HOSPITAL_COMMUNITY): Payer: Self-pay | Admitting: Psychiatry

## 2020-05-26 ENCOUNTER — Other Ambulatory Visit: Payer: Self-pay

## 2020-05-26 DIAGNOSIS — F411 Generalized anxiety disorder: Secondary | ICD-10-CM

## 2020-05-26 DIAGNOSIS — F331 Major depressive disorder, recurrent, moderate: Secondary | ICD-10-CM | POA: Diagnosis not present

## 2020-05-26 DIAGNOSIS — F4310 Post-traumatic stress disorder, unspecified: Secondary | ICD-10-CM | POA: Diagnosis not present

## 2020-05-26 MED ORDER — SERTRALINE HCL 100 MG PO TABS: 100.0000 mg | ORAL_TABLET | Freq: Every day | ORAL | 2 refills | Status: DC

## 2020-05-26 MED ORDER — QUETIAPINE FUMARATE 100 MG PO TABS: 100.0000 mg | ORAL_TABLET | Freq: Every day | ORAL | 2 refills | Status: DC

## 2020-05-26 MED ORDER — BUSPIRONE HCL 15 MG PO TABS: 15.0000 mg | ORAL_TABLET | Freq: Three times a day (TID) | ORAL | 2 refills | Status: DC

## 2020-05-26 MED ORDER — HYDROXYZINE HCL 10 MG PO TABS: 10.0000 mg | ORAL_TABLET | Freq: Three times a day (TID) | ORAL | 2 refills | Status: DC | PRN
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
## 2020-06-04 ENCOUNTER — Ambulatory Visit (HOSPITAL_BASED_OUTPATIENT_CLINIC_OR_DEPARTMENT_OTHER): Payer: PRIVATE HEALTH INSURANCE | Attending: Internal Medicine | Admitting: Internal Medicine

## 2020-06-04 ENCOUNTER — Other Ambulatory Visit: Payer: Self-pay

## 2020-06-04 VITALS — Ht 72.0 in | Wt 200.0 lb

## 2020-06-04 DIAGNOSIS — R400 Somnolence: Secondary | ICD-10-CM

## 2020-06-04 DIAGNOSIS — R0683 Snoring: Secondary | ICD-10-CM

## 2020-06-04 DIAGNOSIS — G4700 Insomnia, unspecified: Secondary | ICD-10-CM

## 2020-06-04 DIAGNOSIS — G4733 Obstructive sleep apnea (adult) (pediatric): Secondary | ICD-10-CM | POA: Insufficient documentation

## 2020-06-04 HISTORY — DX: Snoring: R06.83

## 2020-06-14 DIAGNOSIS — R0683 Snoring: Secondary | ICD-10-CM

## 2020-06-14 NOTE — Procedures (Signed)
    Patient Name: Crystal Winters, Crystal Winters Date: 06/04/2020 Gender: Female D.O.B: [birth year] Age (years): 49 Referring Provider: Julieanne Manson Height (inches): 72 Interpreting Physician: Jetty Duhamel MD, ABSM Weight (lbs): 200 RPSGT: Shelah Lewandowsky BMI: 27 MRN: [id number] Neck Size: 15.00  CLINICAL INFORMATION Sleep Study Type: NPSG  Indication for sleep study: Excessive Daytime Sleepiness, Fatigue, Hypertension, Sleep walking/talking/parasomnias, Snoring Epworth Sleepiness Score: 13  SLEEP STUDY TECHNIQUE As per the AASM Manual for the Scoring of Sleep and Associated Events v2.3 (April 2016) with a hypopnea requiring 4% desaturations.  The channels recorded and monitored were frontal, central and occipital EEG, electrooculogram (EOG), submentalis EMG (chin), nasal and oral airflow, thoracic and abdominal wall motion, anterior tibialis EMG, snore microphone, electrocardiogram, and pulse oximetry.  MEDICATIONS Medications self-administered by patient taken the night of the study : SEROQUEL, GABAPENTIN  SLEEP ARCHITECTURE The study was initiated at 9:57:56 PM and ended at 4:47:07 AM.  Sleep onset time was 36.4 minutes and the sleep efficiency was 88.5%%. The total sleep time was 362 minutes.  Stage REM latency was 89.0 minutes.  The patient spent 2.9%% of the night in stage N1 sleep, 81.5%% in stage N2 sleep, 0.0%% in stage N3 and 15.6% in REM.  Alpha intrusion was absent.  Supine sleep was 44.46%.  RESPIRATORY PARAMETERS The overall apnea/hypopnea index (AHI) was 5.1 per hour. There were 8 total apneas, including 0 obstructive, 8 central and 0 mixed apneas. There were 23 hypopneas and 6 RERAs.  The AHI during Stage REM sleep was 12.7 per hour.  AHI while supine was 10.1 per hour.  The mean oxygen saturation was 92.0%. The minimum SpO2 during sleep was 84.0%.  soft snoring was noted during this study.  CARDIAC DATA The 2 lead EKG demonstrated sinus rhythm.  The mean heart rate was 69.5 beats per minute. Other EKG findings include: PVCs.  LEG MOVEMENT DATA The total PLMS were 0 with a resulting PLMS index of 0.0. Associated arousal with leg movement index was 0.0 .  IMPRESSIONS - Mild obstructive sleep apnea occurred during this study (AHI = 5.1/h). - No significant central sleep apnea occurred during this study (CAI = 1.3/h). - Mild oxygen desaturation was noted during this study (Min O2 = 84.0%). Mean O2 sat 92.2%. - The patient snored with soft snoring volume. - EKG findings include PVCs. - Clinically significant periodic limb movements did not occur during sleep. No significant associated arousals.  DIAGNOSIS - Obstructive Sleep Apnea (G47.33)  RECOMMENDATIONS - Treatment for minimal OSA is directed at symptoms. Conservative measures may include observation, weight loss and sleep position off back.  Other options, including CPAP, a fitted oral appliance, or ENT evaluation, would be based on clinical judgment. - Be careful with alcohol, sedatives and other CNS depressants that may worsen sleep apnea and disrupt normal sleep architecture. - Sleep hygiene should be reviewed to assess factors that may improve sleep quality. - Weight management and regular exercise should be initiated or continued if appropriate.  [Electronically signed] 06/14/2020 01:23 PM  Jetty Duhamel MD, ABSM Diplomate, American Board of Sleep Medicine   NPI: [id number]

## 2020-06-22 ENCOUNTER — Encounter (HOSPITAL_COMMUNITY): Payer: Self-pay

## 2020-06-22 ENCOUNTER — Other Ambulatory Visit: Payer: Self-pay

## 2020-06-22 ENCOUNTER — Emergency Department (HOSPITAL_COMMUNITY)
Admission: EM | Admit: 2020-06-22 | Discharge: 2020-06-22 | Disposition: A | Discharge status: Home / Self Care | Payer: Self-pay | Attending: Emergency Medicine | Admitting: Emergency Medicine

## 2020-06-22 ENCOUNTER — Emergency Department (HOSPITAL_COMMUNITY): Payer: Self-pay

## 2020-06-22 DIAGNOSIS — R197 Diarrhea, unspecified: Secondary | ICD-10-CM | POA: Insufficient documentation

## 2020-06-22 DIAGNOSIS — R42 Dizziness and giddiness: Secondary | ICD-10-CM | POA: Insufficient documentation

## 2020-06-22 DIAGNOSIS — R1032 Left lower quadrant pain: Secondary | ICD-10-CM | POA: Insufficient documentation

## 2020-06-22 DIAGNOSIS — R059 Cough, unspecified: Secondary | ICD-10-CM | POA: Insufficient documentation

## 2020-06-22 DIAGNOSIS — Z20822 Contact with and (suspected) exposure to covid-19: Secondary | ICD-10-CM | POA: Insufficient documentation

## 2020-06-22 DIAGNOSIS — Z79899 Other long term (current) drug therapy: Secondary | ICD-10-CM | POA: Insufficient documentation

## 2020-06-22 DIAGNOSIS — R112 Nausea with vomiting, unspecified: Secondary | ICD-10-CM | POA: Insufficient documentation

## 2020-06-22 DIAGNOSIS — R519 Headache, unspecified: Secondary | ICD-10-CM | POA: Insufficient documentation

## 2020-06-22 DIAGNOSIS — Z7982 Long term (current) use of aspirin: Secondary | ICD-10-CM | POA: Insufficient documentation

## 2020-06-22 DIAGNOSIS — H9202 Otalgia, left ear: Secondary | ICD-10-CM | POA: Insufficient documentation

## 2020-06-22 DIAGNOSIS — J069 Acute upper respiratory infection, unspecified: Secondary | ICD-10-CM | POA: Insufficient documentation

## 2020-06-22 LAB — URINALYSIS, ROUTINE W REFLEX MICROSCOPIC
Bilirubin Urine: NEGATIVE
Glucose, UA: NEGATIVE mg/dL
Hgb urine dipstick: NEGATIVE
Ketones, ur: NEGATIVE mg/dL
Leukocytes,Ua: NEGATIVE
Nitrite: NEGATIVE
Protein, ur: NEGATIVE mg/dL
Specific Gravity, Urine: 1.009 (ref 1.005–1.030)
pH: 6 (ref 5.0–8.0)

## 2020-06-22 LAB — COMPREHENSIVE METABOLIC PANEL
ALT: 16 U/L (ref 0–44)
AST: 21 U/L (ref 15–41)
Albumin: 4.2 g/dL (ref 3.5–5.0)
Alkaline Phosphatase: 94 U/L (ref 38–126)
Anion gap: 8 (ref 5–15)
BUN: 12 mg/dL (ref 6–20)
CO2: 28 mmol/L (ref 22–32)
Calcium: 9 mg/dL (ref 8.9–10.3)
Chloride: 102 mmol/L (ref 98–111)
Creatinine, Ser: 1.05 mg/dL — ABNORMAL HIGH (ref 0.44–1.00)
GFR, Estimated: >60 mL/min (ref >60)
Glucose, Bld: 109 mg/dL — ABNORMAL HIGH (ref 70–99)
Potassium: 3.8 mmol/L (ref 3.5–5.1)
Sodium: 138 mmol/L (ref 135–145)
Total Bilirubin: 0.7 mg/dL (ref 0.3–1.2)
Total Protein: 7.9 g/dL (ref 6.5–8.1)

## 2020-06-22 LAB — CBC WITH DIFFERENTIAL/PLATELET
Abs Immature Granulocytes: 0.03 10*3/uL (ref 0.00–0.07)
Basophils Absolute: 0 10*3/uL (ref 0.0–0.1)
Basophils Relative: 0 %
Eosinophils Absolute: 0.1 10*3/uL (ref 0.0–0.5)
Eosinophils Relative: 1 %
HCT: 45.7 % (ref 36.0–46.0)
Hemoglobin: 14.9 g/dL (ref 12.0–15.0)
Immature Granulocytes: 0 %
Lymphocytes Relative: 18 %
Lymphs Abs: 1.7 10*3/uL (ref 0.7–4.0)
MCH: 27.9 pg (ref 26.0–34.0)
MCHC: 32.6 g/dL (ref 30.0–36.0)
MCV: 85.4 fL (ref 80.0–100.0)
Monocytes Absolute: 0.4 10*3/uL (ref 0.1–1.0)
Monocytes Relative: 5 %
Neutro Abs: 7.1 10*3/uL (ref 1.7–7.7)
Neutrophils Relative %: 76 %
Platelets: 246 10*3/uL (ref 150–400)
RBC: 5.35 MIL/uL — ABNORMAL HIGH (ref 3.87–5.11)
RDW: 12.3 % (ref 11.5–15.5)
WBC: 9.4 10*3/uL (ref 4.0–10.5)
nRBC: 0 % (ref 0.0–0.2)

## 2020-06-22 LAB — LIPASE, BLOOD: Lipase: 31 U/L (ref 11–51)

## 2020-06-22 LAB — RESPIRATORY PANEL BY RT PCR (FLU A&B, COVID)
Influenza A by PCR: NEGATIVE
Influenza B by PCR: NEGATIVE
SARS Coronavirus 2 by RT PCR: NEGATIVE

## 2020-06-22 MED ORDER — ONDANSETRON HCL 4 MG/2ML IJ SOLN
4.0000 mg | Freq: Once | INTRAMUSCULAR | Status: AC
  Administered 2020-06-22: 4 mg via INTRAVENOUS
  Filled 2020-06-22: qty 2

## 2020-06-22 MED ORDER — PROMETHAZINE HCL 25 MG/ML IJ SOLN
12.5000 mg | Freq: Once | INTRAMUSCULAR | Status: AC
  Administered 2020-06-22: 12.5 mg via INTRAVENOUS
  Filled 2020-06-22: qty 1

## 2020-06-22 MED ORDER — PROMETHAZINE HCL 12.5 MG RE SUPP: 12.5000 mg | Freq: Three times a day (TID) | RECTAL | 0 refills | Status: DC | PRN

## 2020-06-22 MED ORDER — ACETAMINOPHEN 500 MG PO TABS
1000.0000 mg | ORAL_TABLET | Freq: Once | ORAL | Status: AC
  Administered 2020-06-22: 1000 mg via ORAL
  Filled 2020-06-22: qty 2

## 2020-06-22 MED ORDER — SODIUM CHLORIDE 0.9 % IV BOLUS
1000.0000 mL | Freq: Once | INTRAVENOUS | Status: AC
  Administered 2020-06-22: 1000 mL via INTRAVENOUS

## 2020-06-22 MED ORDER — MECLIZINE HCL 25 MG PO TABS
25.0000 mg | ORAL_TABLET | Freq: Once | ORAL | Status: AC
  Administered 2020-06-22: 25 mg via ORAL
  Filled 2020-06-22: qty 1

## 2020-06-22 MED ORDER — IOHEXOL 300 MG/ML  SOLN
100.0000 mL | Freq: Once | INTRAMUSCULAR | Status: AC | PRN
  Administered 2020-06-22: 100 mL via INTRAVENOUS

## 2020-06-22 NOTE — ED Notes (Signed)
Patient has been tolerating PO fluids and food without difficulty

## 2020-06-22 NOTE — ED Provider Notes (Signed)
[facility]-EMERGENCY DEPT Provider Note   CSN: [id number] Arrival date & time: 06/22/20  1045     History Chief Complaint  Patient presents with  . Emesis  . Dizziness  . Diarrhea    Crystal Winters is a 49 y.o. female with PMHx HTN, Lupus, Vertigo who presents to the ED today with complaints of room spinning dizziness x 1 week. Pt reports history of vertigo and states this feels similar however she also complains of dry cough, abdominal pain, nausea, vomiting, diarrhea, sore throat, left ear pain, and left sided headache as well. Pt states she does not normally have all of these symptoms with her vertigo. She has been taking Meclizine without relief. She denies any recent sick contacts - she received her moderna vaccines in February and March. Pt has not checked her temperature and is unsure if she has had a fever. She states she feels "ill" and has been unable to keep anything down.    The history is provided by the patient and medical records.       Past Medical History:  Diagnosis Date  . Anemia    Whole life  . Back pain    L/S spine with left radiculopathy  . Diverticulosis of colon with hemorrhage 09/24/2018  . Hypertension 1997  . Hypocalcemia   . Lupus (HCC) 2012   Joint complaints, fatigue, soft tissue swelling, maybe kidney involvement.    . Proteinuria 05/2019  . Vertigo     Patient Active Problem List   Diagnosis Date Noted  . Snoring 06/04/2020  . Moderate episode of recurrent major depressive disorder (HCC) 02/15/2020  . PTSD (post-traumatic stress disorder) 02/12/2020  . GAD (generalized anxiety disorder) 02/12/2020  . MDD (major depressive disorder) 02/12/2020  . Dental decay 12/27/2019  . Thyromegaly 12/27/2019  . Mixed hyperlipidemia 12/27/2019  . Easy bruising 12/27/2019  . Anemia   . Back pain   . 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 Seasonal allergies 05/08/2019  . Breast pain, left 02/19/2019  . Anxiety  02/19/2019  . Insomnia 02/19/2019  . Elevated blood pressure reading with diagnosis of hypertension 02/19/2019  . Exacerbation of systemic lupus (HCC) 12/25/2018  . 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 Hemorrhage of colon following colonoscopy 11/07/2018  . Acute blood loss anemia   . Acute GI bleeding 10/02/2018  . ARF (acute renal failure) (HCC) 10/02/2018  . Lower GI bleed 09/24/2018  . Essential hypertension 09/24/2018  . Syncope 09/24/2018  . Systemic lupus erythematosus (HCC) 09/24/2018  . Hypokalemia 09/24/2018  . Diverticulosis of colon with hemorrhage 09/24/2018  . Hypertension 1997    Past Surgical History:  Procedure Laterality Date  . BREAST REDUCTION SURGERY  2007  . CHOLECYSTECTOMY  1997  . COLONOSCOPY WITH PROPOFOL N/A 09/27/2018   Procedure: COLONOSCOPY WITH PROPOFOL;  Surgeon: Graylin Shiver, MD;  Location: [facility];  Service: Endoscopy;  Laterality: N/A;  . FLEXIBLE SIGMOIDOSCOPY N/A 10/03/2018   Procedure: FLEXIBLE SIGMOIDOSCOPY;  Surgeon: Kathi Der, MD;  Location: [facility];  Service: Gastroenterology;  Laterality: N/A;  . TOE SURGERY Right    Bunionectomy  . TUBAL LIGATION  1992  . VAGINAL HYSTERECTOMY  1999     OB History    Gravida  4   Para      Term      Preterm      AB      Living  3     SAB  TAB      Ectopic      Multiple      Live Births              Family History  Problem Relation Age of Onset  . Hepatitis C Father   . Kidney disease Father        Not sure if due to SLE or if he has SLE  . Lung disease Father        has had 1/2 lung removed for unknown reason  . Thyroid disease Father        Sounds more like hyperthyroidism--can't gain weight.  . Rectal cancer Other   . Hypertension Other   . Diabetes Other   . Breast cancer Other   . Colon cancer Other   . Prostate cancer Other   . Stroke Other   . CAD Other   . Coronary artery disease Other   . Lupus Sister    . Asthma Son   . Kidney disease Paternal Aunt        Likely due to SLE  . Lupus Paternal Aunt   . Kidney disease Paternal Grandmother        kidney disease--not clear if had SLE    Social History   Tobacco Use  . Smoking status: Never Smoker  . Smokeless tobacco: Never Used  Vaping Use  . Vaping Use: Never used  Substance Use Topics  . Alcohol use: Yes    Comment: Wine occasionally  . Drug use: Never    Home Medications Prior to Admission medications   Medication Sig Start Date End Date Taking? Authorizing Provider  acetaminophen (TYLENOL) 500 MG tablet Take 1 tablet (500 mg total) by mouth every 6 (six) hours as needed. 05/15/19   Lorelee New, PA-C  amLODipine (NORVASC) 10 MG tablet Take 1 tablet (10 mg total) by mouth daily. 09/25/19   Julieanne Manson, MD  aspirin EC 81 MG tablet Take 1 tablet (81 mg total) by mouth daily. Swallow whole. 04/14/20   Julieanne Manson, MD  atorvastatin (LIPITOR) 10 MG tablet Take 1 tablet (10 mg total) by mouth daily. 04/22/20 07/21/20  Christell Constant, MD  busPIRone (BUSPAR) 15 MG tablet Take 1 tablet (15 mg total) by mouth 3 (three) times daily. 05/26/20   Shanna Cisco, NP  dicyclomine (BENTYL) 20 MG tablet 1 tab by mouth every 6 hours as needed for bloating 12/27/19   Julieanne Manson, MD  fluticasone [facility]) 50 MCG/ACT nasal spray Place 2 sprays into both nostrils daily. 05/06/19   [name], FNP  hydrochlorothiazide (HYDRODIURIL) 25 MG tablet Take 1 tablet (25 mg total) by mouth daily. 09/25/19   Julieanne Manson, MD  hydroxychloroquine (PLAQUENIL) 200 MG tablet Take 1 tablet (200 mg total) by mouth 2 (two) times daily. 10/12/18   [name], FNP  hydrOXYzine (ATARAX/VISTARIL) 10 MG tablet Take 1 tablet (10 mg total) by mouth 3 (three) times daily as needed. 05/26/20   Shanna Cisco, NP  isosorbide mononitrate (IMDUR) 30 MG 24 hr tablet Take 1 tablet (30 mg total) by mouth daily. 04/22/20 07/21/20   Christell Constant, MD  loratadine (CLARITIN) 10 MG tablet Take 1 tablet (10 mg total) by mouth daily. 05/06/19   [name], FNP  losartan (COZAAR) 100 MG tablet Take 1 tablet (100 mg total) by mouth daily. 09/25/19   Julieanne Manson, MD  meclizine (ANTIVERT) 25 MG tablet Take 1 tablet (25 mg total) by mouth  3 (three) times daily as needed for dizziness. 10/12/18   Kallie LocksStroud, Natalie M, FNP  methocarbamol (ROBAXIN) 500 MG tablet Take 1 tablet (500 mg total) by mouth 2 (two) times daily as needed for muscle spasms. 08/12/19   Kallie LocksStroud, Natalie M, FNP  metoprolol tartrate (LOPRESSOR) 50 MG tablet 1 1/2 tabs by mouth twice daily 04/14/20   Julieanne MansonMulberry, Elizabeth, MD  nitroGLYCERIN (NITROSTAT) 0.4 MG SL tablet Place 1 tablet (0.4 mg total) under the tongue every 5 (five) minutes as needed for chest pain. 04/14/20   Julieanne MansonMulberry, Elizabeth, MD  QUEtiapine (SEROQUEL) 100 MG tablet Take 1 tablet (100 mg total) by mouth at bedtime. 05/26/20   Shanna CiscoParsons, Brittney E, NP  sertraline (ZOLOFT) 100 MG tablet Take 1 tablet (100 mg total) by mouth daily. 05/26/20   Shanna CiscoParsons, Brittney E, NP  triamcinolone cream (KENALOG) 0.1 % Apply to affected area twice daily as needed for itching 12/27/19   Julieanne MansonMulberry, Elizabeth, MD  traZODone (DESYREL) 150 MG tablet Take 1 tablet (150 mg total) by mouth at bedtime. 07/10/19 08/12/19  Kallie LocksStroud, Natalie M, FNP    Allergies    Prochlorperazine  Review of Systems   Review of Systems  Constitutional: Positive for chills and fatigue. Negative for fever.  HENT: Positive for ear pain and sore throat.   Eyes: Negative for visual disturbance.  Respiratory: Positive for cough. Negative for shortness of breath.   Cardiovascular: Negative for chest pain.  Gastrointestinal: Positive for abdominal pain, diarrhea, nausea and vomiting.  Neurological: Positive for dizziness and headaches. Negative for syncope, weakness and numbness.  All other systems reviewed and are negative.   Physical  Exam Updated Vital Signs BP (!) 158/95 (BP Location: Left Arm)   Pulse 63   Temp 98.3 F (36.8 C) (Oral)   Resp 18   SpO2 97%   Physical Exam Vitals and nursing note reviewed.  Constitutional:      Appearance: She is not ill-appearing.     Comments: Gown over pt's face with lights turned down as I enter the room  HENT:     Head: Normocephalic and atraumatic.     Right Ear: Tympanic membrane normal.     Left Ear: Tympanic membrane normal.     Mouth/Throat:     Mouth: Mucous membranes are moist.     Pharynx: No oropharyngeal exudate or posterior oropharyngeal erythema.  Eyes:     Extraocular Movements: Extraocular movements intact.     Conjunctiva/sclera: Conjunctivae normal.     Pupils: Pupils are equal, round, and reactive to light.  Neck:     Meningeal: Brudzinski's sign and Kernig's sign absent.  Cardiovascular:     Rate and Rhythm: Normal rate and regular rhythm.     Pulses: Normal pulses.  Pulmonary:     Effort: Pulmonary effort is normal.     Breath sounds: Normal breath sounds. No wheezing, rhonchi or rales.  Abdominal:     Palpations: Abdomen is soft.     Tenderness: There is abdominal tenderness. There is no right CVA tenderness, left CVA tenderness, guarding or rebound.     Comments: Soft, mild LLQ abdominal TTP, +BS throughout, no r/g/r, neg murphy's, neg mcburney's, no CVA TTP  Musculoskeletal:     Cervical back: Normal range of motion and neck supple.     Right lower leg: No edema.     Left lower leg: No edema.  Skin:    General: Skin is warm and dry.  Neurological:     Mental Status: She  is alert.     Comments: CN 3-12 grossly intact A&O x4 GCS 15 Sensation and strength intact Gait nonataxic including with tandem walking Coordination with finger-to-nose WNL Neg romberg, neg pronator drift     ED Results / Procedures / Treatments   Labs (all labs ordered are listed, but only abnormal results are displayed) Labs Reviewed - No data to  display  EKG None  Radiology No results found.  Procedures Procedures (including critical care time)  Medications Ordered in ED Medications  sodium chloride 0.9 % bolus 1,000 mL (has no administration in time range)  ondansetron (ZOFRAN) injection 4 mg (has no administration in time range)  meclizine (ANTIVERT) tablet 25 mg (has no administration in time range)    ED Course  I have reviewed the triage vital signs and the nursing notes.  Pertinent labs & imaging results that were available during my care of the patient were reviewed by me and considered in my medical decision making (see chart for details).    MDM Rules/Calculators/A&P                          49 year old female with past medical history of lupus who presents to the ED today with multiple complaints.  Primary complaint is room spinning dizziness, history of vertigo, unrelieved with meclizine at home.  Patient is a however planing of a multitude of symptoms today including abdominal pain, nausea, vomiting, diarrhea, cough, sore throat, left ear pain, headache.  She does endorse she normally does not have a symptoms with her vertigo.  On arrival to the ED patient is afebrile, nontachycardic nontachypneic.  As I enter the room she does have the gown over her head with the lights turned down and she states that anytime she moves she feels very dizzy.  Patient denies any recent sick contacts, she was vaccinated for Covid with Val[facility] Mole in February and March however has not had a booster since then.  Exam patient has some left lower quadrant abdominal tenderness palpation however no rebound or guarding.  She states she has a history of diverticulitis and is unsure if this is the cause of her symptoms today.  She has no focal neuro deficits on exam today, no meningeal signs as well.  We will plan to provide fluids, meclizine, Zofran for nausea.  We will plan to get labs including abdominal screening labs CBC, CMP, lipase, UA.  Will  swab for Covid.  Will get chest x-ray for cough.   CXR clear EKG without acute ischemic changes CBC without leukocytosis. Hgb stable at 14.9 CMP with creatinine 1.05; receiving fluids. BUN normal. LFTs unremarkable at this time.   Lab Results  Component Value Date   CREATININE 1.05 (H) 06/22/2020   CREATININE 0.91 04/14/2020   CREATININE 1.00 12/27/2019   Lipase 31.  COVID has returned negative. In the setting of abdominal pain in the LLQ with history of diverticulitis will obtain CT scan at this time. Pt continues to complain of dizziness and a headache; will obtain CT Head as well. Tylenol given for headache at this time. No history of migraines and does not appear to be typical of migraine headache.   At shift change case signed out to Berle Mull, PA-C, who will dispo patient accordingly after CT scans. Pt to be fluid challenged prior.   Final Clinical Impression(s) / ED Diagnoses Final diagnoses:  None    Rx / DC Orders ED Discharge Orders  None       Tanda Rockers, PA-C 06/22/20 1511    Melene Plan, DO 06/22/20 1511

## 2020-06-22 NOTE — ED Triage Notes (Signed)
Pt arrived via walk in, c/o dizziness, headache, vomiting, nausea and diarrhea x1 week. Diffuse abd pain. Denies any sick contacts.

## 2020-06-22 NOTE — ED Provider Notes (Signed)
Patient was received at shift change from margaux venter PA-C she provided HPI, current work-up, likely disposition please see her note for full detail.  Per her instructions will follow up on CT head and CT abdomen pelvis. If there are no  acute findings on imaging and patient is tolerating p.o. without difficulty will send patient home and follow-up at PCP. Physical Exam  BP (!) 163/105 (BP Location: Right Arm)   Pulse 66   Temp 98.3 F (36.8 C) (Oral)   Resp 16   SpO2 96%   Physical Exam Vitals and nursing note reviewed.  Constitutional:      General: She is not in acute distress.    Appearance: Normal appearance. She is not ill-appearing.  HENT:     Head: Normocephalic and atraumatic.     Nose: No congestion.  Eyes:     Conjunctiva/sclera: Conjunctivae normal.  Pulmonary:     Effort: Pulmonary effort is normal.  Musculoskeletal:     Cervical back: Neck supple.     Comments: Patient is moving all 4 extremities out difficulty.   Skin:    General: Skin is warm and dry.  Neurological:     General: No focal deficit present.     Mental Status: She is alert.     Comments: Patient having no difficulty with word finding.  Psychiatric:        Mood and Affect: Mood normal.     ED Course/Procedures     Procedures  MDM  CT abdomen pelvis does not reveal any acute abnormalities, noted sigmoid diverticulosis.  CT head does not show any acute intracranial abnormalities, CSF density throughout the sella turcica consistent with empty sella, likely incidental.  Patient was informed of incidental finding of empty sella.  Patient reevaluated states she still feels slightly nauseous but states her dizziness has since improved.  Will provide her with Phenergan at this time and p.o. challenge her.  If she tolerates p.o. feel comfortable discharge patient have her follow-up with PCP.   Patient is tolerating p.o. we will discharge her home with Phenergan and have her follow-up with PCP for  further evaluation.  Vital signs have remained stable, no indication for hospital admission.  Patient given at home care as well strict return precautions.  Patient verbalized that they understood agreed to said plan.          Carroll Sage, PA-C 06/22/20 1842    Little, Ambrose Finland, MD 06/23/20 [id number]

## 2020-06-22 NOTE — Discharge Instructions (Signed)
Seen here for dizziness, nausea vomiting.  Lab work and imaging looks reassuring.  Recommend continue with your home medication as prescribed.  Provide you with Phenergan suppositories please use every 8 hours as needed.  Recommend follow-up with your PCP for further evaluation management.  Come back to the emergency department if you develop chest pain, shortness of breath, severe abdominal pain, uncontrolled nausea, vomiting, diarrhea.

## 2020-06-29 ENCOUNTER — Other Ambulatory Visit: Payer: Self-pay

## 2020-06-29 ENCOUNTER — Ambulatory Visit (INDEPENDENT_AMBULATORY_CARE_PROVIDER_SITE_OTHER): Payer: No Payment, Other | Admitting: Clinical

## 2020-06-29 DIAGNOSIS — F411 Generalized anxiety disorder: Secondary | ICD-10-CM | POA: Diagnosis not present

## 2020-06-29 NOTE — Progress Notes (Signed)
THERAPIST PROGRESS NOTE Virtual Visit via Video Note  I connected with Crystal Winters on 06/29/20 at 11:00 AM EST by a video enabled telemedicine application and verified that I am speaking with the correct person using two identifiers.  Location: Patient: Home Provider: Office   I discussed the limitations of evaluation and management by telemedicine and the availability of in person appointments. The patient expressed understanding and agreed to proceed.   Follow Up Instructions: I discussed the assessment and treatment plan with the patient. The patient was provided an opportunity to ask questions and all were answered. The patient agreed with the plan and demonstrated an understanding of the instructions.   The patient was advised to call back or seek an in-person evaluation if the symptoms worsen or if the condition fails to improve as anticipated.    Session Time:  40 minutes  Participation Level: Active  Behavioral Response: CasualAlertEuthymic  Type of Therapy: Individual Therapy  Treatment Goals addressed: Anxiety  Interventions: CBT  Summary:  Crystal Winters is a 49 y.o. female who presents for the scheduled appointment appropriately dressed, oriented times five, and friendly. Client denied hallucinations and delusions. Client had difficulty with connecting to MyChart and logged back into the room at two different points during the conversation. Client reported on today she is feeling good. Client reported since the last session she has lost six people close to her. Client reported about two weeks ago having thoughts and emotions of feeling overwhelmed and in a panic. Client reported she noticed due to those recent deaths it also affected her boyfriend and her mother. Client reported her mother relapsed on alcohol and her boyfriend has been drinking more than usual. Client reported it is triggering because in the past her mother would get drunk then call her and cuss  her out. Client reported with being the financial bread winner her boyfriend and mother ask her for money may be enabling their drinking behavior so she has set some boundaries to it. Client reported she went to church the past Sunday and she felt good going back to what she knows. Client reported since she got into the relationship with her current boyfriend she neglected that part of her because she was focused him him and putting energy into why he was treating her the way he was. Client reported in light of feeling good about getting back in touch with her values and practices she is feeling more assertive about what she requires in her relationship. Client reported growing tired of her boyfriends projections of his insecurities. Client reported she stated to him "I won't allow you to attach my name to negative associations because that's not".     Suicidal/Homicidal: Nowithout intent/plan  Therapist Response: Therapist began the session by checking in and asking the client how she has been doing since last seen. Therapist actively listened to the clients thoughts and feelings.  Therapist engaged the client in discussion using open ended questions to gather further detail on the identified stressors.  Therapist used CBT to discuss breaking the cycle of depressive thoughts and improving distress tolerance by asserting herself and being consistent with her boundaries. Therapist assigned the client homework to use her journal to write her notes about being self aware of how her behavior has changed since engaging in previous hobby/ activity and brainstorm when and why she is being assertive with her boundaries.    Plan: Return again in 3 weeks for individual therapy.  Diagnosis: Generalized anxiety disorder  Neena Rhymes Antonino Nienhuis, LCSW 06/29/2020

## 2020-06-30 ENCOUNTER — Ambulatory Visit (INDEPENDENT_AMBULATORY_CARE_PROVIDER_SITE_OTHER): Payer: Self-pay | Admitting: Internal Medicine

## 2020-06-30 ENCOUNTER — Other Ambulatory Visit: Payer: Self-pay

## 2020-06-30 VITALS — BP 146/92 | HR 76 | Resp 15 | Ht 69.0 in | Wt 213.0 lb

## 2020-06-30 DIAGNOSIS — M3214 Glomerular disease in systemic lupus erythematosus: Secondary | ICD-10-CM

## 2020-06-30 DIAGNOSIS — E7800 Pure hypercholesterolemia, unspecified: Secondary | ICD-10-CM

## 2020-06-30 DIAGNOSIS — I10 Essential (primary) hypertension: Secondary | ICD-10-CM

## 2020-06-30 DIAGNOSIS — Z23 Encounter for immunization: Secondary | ICD-10-CM

## 2020-06-30 DIAGNOSIS — R079 Chest pain, unspecified: Secondary | ICD-10-CM

## 2020-06-30 DIAGNOSIS — Z79899 Other long term (current) drug therapy: Secondary | ICD-10-CM

## 2020-06-30 DIAGNOSIS — Z0000 Encounter for general adult medical examination without abnormal findings: Secondary | ICD-10-CM

## 2020-06-30 MED ORDER — HYDROXYCHLOROQUINE SULFATE 200 MG PO TABS: 200.0000 mg | ORAL_TABLET | Freq: Two times a day (BID) | ORAL | 11 refills | Status: AC

## 2020-06-30 MED ORDER — PROMETHAZINE HCL 25 MG PO TABS: 25.0000 mg | ORAL_TABLET | Freq: Three times a day (TID) | ORAL | 0 refills | Status: DC | PRN

## 2020-06-30 NOTE — Progress Notes (Signed)
Subjective:    Patient ID: Crystal Winters, female   DOB: 02-19-1971, 49 y.o.   MRN: [id number]   HPI   1.  Atypical Chest Pain:  Saw Dr. Izora Ribas, cardiology in September after here for presentation of atypical chest pain.  Has tolerated increase in Metoprolol as well as addition of nitrates. LVH on echo Also taking Atorvastatin and tolerating well. Has not had chest discomfort as before.   Had a fleeting discomfort last week during a stressful period.  Was a different discomfort and did not last long enough to try a SL nitroglycerin.   Lost 3 people from COVID, 1 from pneumonia and 2 from cancer in past 6 weeks.      2.  Hypertension:  Did not take any of her bp meds this morning--is out of Losartan.  Discussed need to take meds as typically does when in for follow up unless discussed otherwise.  3.  Hypercholesterolemia:  Taking Atorvastatin.  FLP not checked since starting.  4.  SLE:  Not taking Hydroxychloroquine as cannot afford.  Has a new Rheumatologist with [facility].  Has not heard back about appt.  Has not seen Rheum since beginning of year.  5.  Mental Health:  Feels at peace and feels working with [employer], she is doing well.  Was switched from Trazodone to Seroquel mid October for sleep issues and feels she is doing better with this change.  See loss of loved ones in #1 as an exacerbator.   6.  More problems with vertigo recently.  Was given Rx for Phenergan suppositories, but too expensive.  Wondering if something else less expensive.  She has side effects with Compazine, but not with phenergan.  Zofran does not help for her.  7.  Possible OSA:  Have not been able to obtain the result.  In results/procedures states not yet resulted.  Patient states during the sleep study, she was placed on CPAP for the first 30 minutes, let her sleep and then took it off.  She was told if she needed it during the latter part of the study, they would place it again.  That was not  done.  Suspect no significant OSA found.  We are calling the sleep disorders clinic to see if can get the formal and final result.  Ultimately, found report in Notes after calling sleep center.  She has mild OSA and recommended working on weight, sleep position, meds at bedtime.  Current Meds  Medication Sig   amLODipine (NORVASC) 10 MG tablet Take 1 tablet (10 mg total) by mouth daily.   aspirin EC 81 MG tablet Take 1 tablet (81 mg total) by mouth daily. Swallow whole.   atorvastatin (LIPITOR) 10 MG tablet Take 1 tablet (10 mg total) by mouth daily.   busPIRone (BUSPAR) 15 MG tablet Take 1 tablet (15 mg total) by mouth 3 (three) times daily.   dicyclomine (BENTYL) 20 MG tablet 1 tab by mouth every 6 hours as needed for bloating   hydrochlorothiazide (HYDRODIURIL) 25 MG tablet Take 1 tablet (25 mg total) by mouth daily.   hydrOXYzine (ATARAX/VISTARIL) 10 MG tablet Take 1 tablet (10 mg total) by mouth 3 (three) times daily as needed.   isosorbide mononitrate (IMDUR) 30 MG 24 hr tablet Take 1 tablet (30 mg total) by mouth daily.   loratadine (CLARITIN) 10 MG tablet Take 1 tablet (10 mg total) by mouth daily.   losartan (COZAAR) 100 MG tablet Take 1 tablet (100 mg total)  by mouth daily.   meclizine (ANTIVERT) 25 MG tablet Take 1 tablet (25 mg total) by mouth 3 (three) times daily as needed for dizziness.   metoprolol tartrate (LOPRESSOR) 50 MG tablet 1 1/2 tabs by mouth twice daily   QUEtiapine (SEROQUEL) 100 MG tablet Take 1 tablet (100 mg total) by mouth at bedtime.   sertraline (ZOLOFT) 100 MG tablet Take 1 tablet (100 mg total) by mouth daily.        Review of Systems    Objective:   BP (!) 146/92 (BP Location: Left Arm, Patient Position: Sitting, Cuff Size: Normal)   Pulse 76   Resp 15   Ht 5\' 9"  (1.753 m)   Wt 213 lb (96.6 kg)   BMI 31.45 kg/m   Physical Exam NAD HEENT:  PERRL, EOMI, TMs clear Neck:  Supple, No adenopathy,  Chest:  CTA CV:  RRR with normal S1 andS2, No  S3, S4 or murmur.  Radial and DP pulses normal and equal. Abd:  NT, + BS, No HSM or mass. LE:  No edema.  Assessment & Plan    Hypertension:  not controlled, but did not take meds this morning.  Has refills of losartan.  Encouraged her to never run out.  2.  Hyperlipidemia:  FLP, hepatic profile.  Continue statin.  3.  Chest pain:  undergoing evaluation for cardiac disease.  Control BP, Metoprolol, imdur, control cholesterol.  ASA.    4.  SLE with renal involvement:  will refill hydroxychloroquine until established with new Rheumatologist  5.  Vertigo:  Phenergan for nausea.  6.  HM:  influenza vaccine.  Encouraged COVID booster vaccine next Monday with vaccine clinic.

## 2020-07-01 LAB — HEPATIC FUNCTION PANEL
ALT: 10 IU/L (ref 0–32)
AST: 16 IU/L (ref 0–40)
Albumin: 4.6 g/dL (ref 3.8–4.8)
Alkaline Phosphatase: 113 IU/L (ref 44–121)
Bilirubin Total: 0.4 mg/dL (ref 0.0–1.2)
Bilirubin, Direct: 0.13 mg/dL (ref 0.00–0.40)
Total Protein: 7.3 g/dL (ref 6.0–8.5)

## 2020-07-01 LAB — LIPID PANEL W/O CHOL/HDL RATIO
Cholesterol, Total: 205 mg/dL — ABNORMAL HIGH (ref 100–199)
HDL: 64 mg/dL (ref >39)
LDL Chol Calc (NIH): 126 mg/dL — ABNORMAL HIGH (ref 0–99)
Triglycerides: 83 mg/dL (ref 0–149)
VLDL Cholesterol Cal: 15 mg/dL (ref 5–40)

## 2020-07-07 ENCOUNTER — Encounter: Payer: Medicaid Other | Admitting: Internal Medicine

## 2020-07-13 ENCOUNTER — Other Ambulatory Visit: Payer: Self-pay

## 2020-07-13 ENCOUNTER — Ambulatory Visit (INDEPENDENT_AMBULATORY_CARE_PROVIDER_SITE_OTHER): Payer: No Payment, Other | Admitting: Clinical

## 2020-07-13 DIAGNOSIS — F411 Generalized anxiety disorder: Secondary | ICD-10-CM | POA: Diagnosis not present

## 2020-07-13 NOTE — Progress Notes (Signed)
   THERAPIST PROGRESS NOTE Virtual Visit via Video Note  I connected with [name] on 07/13/20 at 11:00 AM EST by a video enabled telemedicine application and verified that I am speaking with the correct person using two identifiers.  Location: Patient: home Provider: office   I discussed the limitations of evaluation and management by telemedicine and the availability of in person appointments. The patient expressed understanding and agreed to proceed.   Follow Up Instructions: I discussed the assessment and treatment plan with the patient. The patient was provided an opportunity to ask questions and all were answered. The patient agreed with the plan and demonstrated an understanding of the instructions.   The patient was advised to call back or seek an in-person evaluation if the symptoms worsen or if the condition fails to improve as anticipated.    Session Time: 30 minutes  Participation Level: Active  Behavioral Response: CasualAlertEuthymic  Type of Therapy: Individual Therapy  Treatment Goals addressed: Diagnosis: depression  Interventions: CBT  Summary:  Deryl Ports is a 49 y.o. female who presents for the scheduled session oriented times five, appropriately dressed, and friendly. Client denied hallucinations and delusions. Client reported on today she is doing well. Client reported since the last session she has continued to feel an improvement in her thoughts and her emotions. Client reported she had a good thanksgiving but has plans to see her son in [place] for Christmas. Client reported she will be taking her grandchildren with her also to look forward to. Client reported she still has some struggle with interaction with her mother. Client reported since her mothers relapse on alcohol she has not been taking care of herself and fails to understand how her behavior is a trigger to the client. Client reported she has been working on separating herself emotionally  from being drained by her mothers actions. Client reported she has also seen improvement in her relationship now that he is understanding other aspects of her life that she finds to be important. 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 Client reported she has the plan to use her next book as a way to discuss her experiences of using emotions to make decisions that had long term consequences.    Suicidal/Homicidal: Nowithout intent/plan  Therapist Response:  Therapist began the session by checking in and asking how she has been doing since last seen. Therapist actively listened to the clients thoughts and feelings. Therapist engaged with the client in asking open ended questions about changes she has made and the improvement it has made on her emotional and mental well being. Therapist used CBT to collaborate with the client to discuss her processing of how emotions have affected some decisions she's made in her life good and bad. Therapist instructed the client to assert herself and practice her interests that she has neglected over time.    Plan: Return again in 7 weeks for individual therapy.  Diagnosis: Generalized anxiety disorder  Neena Rhymes Yaretzi Ernandez, LCSW 07/13/2020

## 2020-07-26 MED ORDER — ATORVASTATIN CALCIUM 20 MG PO TABS: ORAL_TABLET | ORAL | 11 refills | Status: AC

## 2020-08-11 ENCOUNTER — Telehealth: Payer: Self-pay | Admitting: Internal Medicine

## 2020-08-11 NOTE — Telephone Encounter (Signed)
Patient called asking for an appointment. Patient stated that 2 days ago her stomach started to hurting and is been having diarrhea, vomit and is feeling tired. Patient mentioned that she noticed a little bit of blood on her stool as well. Please advise.

## 2020-08-11 NOTE — Telephone Encounter (Signed)
Acute appointment for patient was schedule for 08/12/2020 @ 12:00pm

## 2020-08-11 NOTE — Telephone Encounter (Signed)
See if can get her in as an acute. To sip on water or flat ginger ale  Make sure no cough or sore throat, body aches as well

## 2020-08-11 NOTE — Telephone Encounter (Signed)
Call patient and left a message asking her to call us back to schedule an acute appointment.

## 2020-08-12 ENCOUNTER — Encounter: Payer: Self-pay | Admitting: Internal Medicine

## 2020-08-12 ENCOUNTER — Other Ambulatory Visit: Payer: Self-pay

## 2020-08-12 ENCOUNTER — Ambulatory Visit: Payer: Medicaid Other | Admitting: Internal Medicine

## 2020-08-12 VITALS — BP 210/110 | HR 68 | Temp 98.0°F | Resp 20 | Ht 70.0 in | Wt 217.2 lb

## 2020-08-12 DIAGNOSIS — A084 Viral intestinal infection, unspecified: Secondary | ICD-10-CM

## 2020-08-12 MED ORDER — PROMETHAZINE HCL 25 MG PO TABS: 25.0000 mg | ORAL_TABLET | Freq: Three times a day (TID) | ORAL | 0 refills | Status: DC | PRN

## 2020-08-12 MED ORDER — ISOSORBIDE MONONITRATE ER 30 MG PO TB24: 30.0000 mg | ORAL_TABLET | Freq: Every day | ORAL | 3 refills | Status: AC

## 2020-08-12 NOTE — Progress Notes (Signed)
Subjective:    Patient ID: Crystal Winters, female   DOB: 1971/04/08, 50 y.o.   MRN: [id number]   HPI   Micah Flesher to [place] for 2 weeks, returning on December 31st.   She states she developed abdominal cramping of bilateral upper and lower quadrants on December 30th.   Diarrhea started on January 1st. Noted BRBPR after several large volume orange brown stools.  States she had 4-5 that day. No blood otherwise. No fever. Later that night, awakened with N & V.  Took phenergan and vomited immediately.  "Shooting out both ends!" Was able to take phenergan again later and keep down--felt better.  Ate toast and drank some ginger ale with resulting increased abdominal cramping. Jan 2nd:  feeling better.  Started intake of more regular toast and ginger ale.  Took dicyclomine which helped with cramping.   Jan 3rd:  No vomiting.  Able to keep down more water, ginger ale, and crackers.  3 episodes of diarrhea Jan 4th, yesterday:  Bad cramping and diarrhea x 6. Today:  still cramping, but no diarrhea. Has been drinking large amounts of water and ginger ale until yesterday with cramping.  When in [place], she ate pork roasted in ground--apparently a traditional way to cook the pork there.  Thinks this was on December 29th.  Six others she was with ate the same meal and no symptoms.  Never ate different foods from her companions.   No URI symptoms.       Current Meds  Medication Sig   amLODipine (NORVASC) 10 MG tablet Take 1 tablet (10 mg total) by mouth daily.   aspirin EC 81 MG tablet Take 1 tablet (81 mg total) by mouth daily. Swallow whole.   atorvastatin (LIPITOR) 20 MG tablet 1 tab by mouth with evening meal daily   busPIRone (BUSPAR) 15 MG tablet Take 1 tablet (15 mg total) by mouth 3 (three) times daily.   dicyclomine (BENTYL) 20 MG tablet 1 tab by mouth every 6 hours as needed for bloating   hydrochlorothiazide (HYDRODIURIL) 25 MG tablet Take 1 tablet (25 mg total) by mouth daily.    hydroxychloroquine (PLAQUENIL) 200 MG tablet Take 1 tablet (200 mg total) by mouth 2 (two) times daily.   hydrOXYzine (ATARAX/VISTARIL) 10 MG tablet Take 1 tablet (10 mg total) by mouth 3 (three) times daily as needed.   loratadine (CLARITIN) 10 MG tablet Take 1 tablet (10 mg total) by mouth daily.   losartan (COZAAR) 100 MG tablet Take 1 tablet (100 mg total) by mouth daily.   meclizine (ANTIVERT) 25 MG tablet Take 1 tablet (25 mg total) by mouth 3 (three) times daily as needed for dizziness.   metoprolol tartrate (LOPRESSOR) 50 MG tablet 1 1/2 tabs by mouth twice daily   QUEtiapine (SEROQUEL) 100 MG tablet Take 1 tablet (100 mg total) by mouth at bedtime.   sertraline (ZOLOFT) 100 MG tablet Take 1 tablet (100 mg total) by mouth daily.   Allergies  Allergen Reactions   Prochlorperazine Other (See Comments), Swelling and Palpitations    Muscle stiffness Muscle stiffness     Review of Systems    Objective:   BP (!) 210/110 (BP Location: Left Arm, Patient Position: Sitting, Cuff Size: Normal)   Pulse 68   Temp 98 F (36.7 C) (Oral)   Resp 20   Ht 5\' 10"  (1.778 m)   Wt 217 lb 4 oz (98.5 kg)   BMI 31.17 kg/m   Physical Exam NAD HEENT:  PERRL, EOMI, TMs pearly gray, throat without injection.  MMM Neck:  Supple, No adenopathy Chest:  CTA CV:  RRR without murmur or rub.  Radial and DP pulses normal and equal Abd:  S, diffusely mildly tender without rebound or peritoneal signs.  Somewhat increased BS.  No HSM or mass.   Skin:  no rash with good turgor.   Assessment & Plan    Likely viral gastroenteritis:  appears nontoxic and well hydrated currently.  To continue with clear liquids and bland diet currently.  Avoid dairy and gradually advance diet as she has done.  Refill of phenergan written.  To call if does not continue to gradually improve.  2.  Hypertension:  improved prior to discharge, though still elevated:  150/90.  EHR shut down and not able to get back on while in  clinic.

## 2020-08-21 ENCOUNTER — Other Ambulatory Visit: Payer: Self-pay

## 2020-08-21 ENCOUNTER — Ambulatory Visit (INDEPENDENT_AMBULATORY_CARE_PROVIDER_SITE_OTHER): Payer: PRIVATE HEALTH INSURANCE | Admitting: Internal Medicine

## 2020-08-21 ENCOUNTER — Encounter: Payer: Self-pay | Admitting: Internal Medicine

## 2020-08-21 VITALS — BP 150/100 | HR 61 | Ht 70.0 in | Wt 218.0 lb

## 2020-08-21 DIAGNOSIS — G5712 Meralgia paresthetica, left lower limb: Secondary | ICD-10-CM | POA: Insufficient documentation

## 2020-08-21 DIAGNOSIS — R079 Chest pain, unspecified: Secondary | ICD-10-CM

## 2020-08-21 DIAGNOSIS — I200 Unstable angina: Secondary | ICD-10-CM

## 2020-08-21 HISTORY — DX: Meralgia paresthetica, left lower limb: G57.12

## 2020-08-21 MED ORDER — CHLORTHALIDONE 25 MG PO TABS: 25.0000 mg | ORAL_TABLET | Freq: Every day | ORAL | 3 refills | Status: AC

## 2020-08-21 NOTE — Progress Notes (Signed)
Cardiology Office Note:    Date:  08/21/2020   ID:  Crystal LaurenceAudrena Winters, DOB 1971/06/26, MRN [id number]  PCP:  Julieanne MansonMulberry, Elizabeth, MD  [facility] Cardiologist:  Christell ConstantMahesh A Jacai Kipp, MD  [facility] Electrophysiologist:  None   Referring MD: Julieanne MansonMulberry, Elizabeth, MD   CC: Shortness of breath Consulted for the evaluation of chest pain at the behest of Julieanne MansonMulberry, Elizabeth, MD  History of Present Illness:    Crystal Winters Absher is a 50 y.o. female with a hx of HTN,  HLD, SLE on Plaquenil, and Anemia who presented for evaluation 04/22/20.  Since last visit, started on Imdur, had normal echocardiogram, and started moderate intensity statin.  Patient notes that she is doing better.  Since last visit notes decrease in her chest pain.  Last chest pain was two weeks ago that lasted 10 seconds only when she was cleaning.  Had some shortness of breath.  No more anemia issues. Relevant interval testing include Hgb 14.9 (improvement.  There are no interval hospital/ED visit.    No PND/Orthopnea.  No weight gain or leg swelling.  Notes than her ankles are swollen when she stands too long.  No palpitations or syncope.    Ambulatory blood pressure 145/95.  Past Medical History:  Diagnosis Date  . Anemia    Whole life  . Back pain    L/S spine with left radiculopathy  . Diverticulosis of colon with hemorrhage 09/24/2018  . Hypertension 1997  . Hypocalcemia   . Lupus (HCC) 2012   Joint complaints, fatigue, soft tissue swelling, maybe kidney involvement.    . Proteinuria 05/2019  . Vertigo     Past Surgical History:  Procedure Laterality Date  . BREAST REDUCTION SURGERY  2007  . CHOLECYSTECTOMY  1997  . COLONOSCOPY WITH PROPOFOL N/A 09/27/2018   Procedure: COLONOSCOPY WITH PROPOFOL;  Surgeon: Graylin ShiverGanem, Salem F, MD;  Location: [facility];  Service: Endoscopy;  Laterality: N/A;  . FLEXIBLE SIGMOIDOSCOPY N/A 10/03/2018   Procedure: FLEXIBLE SIGMOIDOSCOPY;  Surgeon: Kathi DerBrahmbhatt, Parag, MD;   Location: [facility];  Service: Gastroenterology;  Laterality: N/A;  . TOE SURGERY Right    Bunionectomy  . TUBAL LIGATION  1992  . VAGINAL HYSTERECTOMY  1999   Current Medications: Current Meds  Medication Sig  . amLODipine (NORVASC) 10 MG tablet Take 1 tablet (10 mg total) by mouth daily.  Marland Kitchen. aspirin EC 81 MG tablet Take 1 tablet (81 mg total) by mouth daily. Swallow whole.  Marland Kitchen. atorvastatin (LIPITOR) 20 MG tablet 1 tab by mouth with evening meal daily  . busPIRone (BUSPAR) 15 MG tablet Take 1 tablet (15 mg total) by mouth 3 (three) times daily.  . chlorthalidone (HYGROTON) 25 MG tablet Take 1 tablet (25 mg total) by mouth daily.  Marland Kitchen. dicyclomine (BENTYL) 20 MG tablet 1 tab by mouth every 6 hours as needed for bloating  . hydroxychloroquine (PLAQUENIL) 200 MG tablet Take 1 tablet (200 mg total) by mouth 2 (two) times daily.  . hydrOXYzine (ATARAX/VISTARIL) 10 MG tablet Take 1 tablet (10 mg total) by mouth 3 (three) times daily as needed.  . isosorbide mononitrate (IMDUR) 30 MG 24 hr tablet Take 1 tablet (30 mg total) by mouth daily.  Marland Kitchen. loratadine (CLARITIN) 10 MG tablet Take 1 tablet (10 mg total) by mouth daily.  Marland Kitchen. losartan (COZAAR) 100 MG tablet Take 1 tablet (100 mg total) by mouth daily.  . meclizine (ANTIVERT) 25 MG tablet Take 1 tablet (25 mg total) by mouth 3 (three) times daily as  needed for dizziness.  . metoprolol tartrate (LOPRESSOR) 50 MG tablet 1 1/2 tabs by mouth twice daily  . nitroGLYCERIN (NITROSTAT) 0.4 MG SL tablet Place 1 tablet (0.4 mg total) under the tongue every 5 (five) minutes as needed for chest pain.  . promethazine (PHENERGAN) 25 MG tablet Take 1 tablet (25 mg total) by mouth every 8 (eight) hours as needed for nausea or vomiting.  Marland Kitchen QUEtiapine (SEROQUEL) 100 MG tablet Take 1 tablet (100 mg total) by mouth at bedtime.  . sertraline (ZOLOFT) 100 MG tablet Take 1 tablet (100 mg total) by mouth daily.  . [DISCONTINUED] hydrochlorothiazide (HYDRODIURIL) 25 MG tablet  Take 1 tablet (25 mg total) by mouth daily.    Allergies:   Prochlorperazine   Social History   Socioeconomic History  . Marital status: Media planner    Spouse name: Divorced  . Number of children: 3  . Years of education: Not on file  . Highest education level: Bachelor's degree (e.g., BA, AB, BS)  Occupational History  . Occupation: Mental Health Professional  Tobacco Use  . Smoking status: Never Smoker  . Smokeless tobacco: Never Used  Vaping Use  . Vaping Use: Never used  Substance and Sexual Activity  . Alcohol use: Yes    Comment: Wine occasionally  . Drug use: Never  . Sexual activity: Yes    Birth control/protection: Surgical  Other Topics Concern  . Not on file  Social History Narrative   Working on [employer] in [employer] at home with fiance.   2 dogs, Criss Alvine and Duke   Social Determinants of Health   Financial Resource Strain: Low Risk   . Difficulty of Paying Living Expenses: Not hard at all  Food Insecurity: No Food Insecurity  . Worried About Programme researcher, broadcasting/film/video in the Last Year: Never true  . Ran Out of Food in the Last Year: Never true  Transportation Needs: No Transportation Needs  . Lack of Transportation (Medical): No  . Lack of Transportation (Non-Medical): No  Physical Activity: Not on file  Stress: Stress Concern Present  . Feeling of Stress : Very much  Social Connections: Not on file    Works in [employer]; son recently stationed in the Child psychotherapist at [place]  Family History: The patient's family history includes Asthma in her son; Breast cancer in an other family member; CAD in an other family member; Colon cancer in an other family member; Coronary artery disease in an other family member; Diabetes in an other family member; Hepatitis C in her father; Hypertension in an other family member; Kidney disease in her father, paternal aunt, and paternal grandmother; Lung disease in her father; Lupus in her paternal aunt and  sister; Prostate cancer in an other family member; Rectal cancer in an other family member; Stroke in an other family member; Thyroid disease in her father. Grandmother and mother with arrhythmia. Aunt and cousins had CAD with bypass  ROS:   Please see the history of present illness.    Notes left leg constant pain over outer thigh worse with compression All other systems reviewed and are negative.  EKGs/Labs/Other Studies Reviewed:    The following studies were reviewed today:  EKG:   04/14/20:  Sinus rhythm rate 67 no ST/T changes or pathologic q waves; QTc 422  Transthoracic Echocardiogram: Date: 04/24/20 Results: Mild Thoracic Aortic Index 2.1 IMPRESSIONS  1. Left ventricular ejection fraction, by estimation, is 60 to 65%. The  left ventricle has  normal function. The left ventricle has no regional  wall motion abnormalities. There is mild concentric left ventricular  hypertrophy. Left ventricular diastolic  parameters are indeterminate. The average left ventricular global  longitudinal strain is -24.9 %. The global longitudinal strain is normal.  2. Right ventricular systolic function is normal. The right ventricular  size is normal. There is normal pulmonary artery systolic pressure. The  estimated right ventricular systolic pressure is 14.0 mmHg.  3. The mitral valve is normal in structure. Trivial mitral valve  regurgitation. No evidence of mitral stenosis.  4. The aortic valve is normal in structure. Aortic valve regurgitation is  not visualized. No aortic stenosis is present.  5. Aortic dilatation noted. There is mild dilatation of the ascending  aorta, measuring 38 mm.  6. The inferior vena cava is normal in size with greater than 50%  respiratory variability, suggesting right atrial pressure of 3 mmHg.    Recent Labs: 12/27/2019: TSH 1.360 06/22/2020: BUN 12; Creatinine, Ser 1.05; Hemoglobin 14.9; Platelets 246; Potassium 3.8; Sodium 138 06/30/2020: ALT 10   Recent Lipid Panel    Component Value Date/Time   CHOL 205 (H) 06/30/2020 1058   TRIG 83 06/30/2020 1058   HDL 64 06/30/2020 1058   CHOLHDL 3.1 10/12/2018 1112   LDLCALC 126 (H) 06/30/2020 1058    Physical Exam:    VS:  BP (!) 150/100   Pulse 61   Ht 5\' 10"  (1.778 m)   Wt 218 lb (98.9 kg)   SpO2 97%   BMI 31.28 kg/m     Wt Readings from Last 3 Encounters:  08/21/20 218 lb (98.9 kg)  08/12/20 217 lb 4 oz (98.5 kg)  06/30/20 213 lb (96.6 kg)   GEN: Well nourished, well developed in no acute distress HEENT: Normal NECK: No JVD; No carotid bruits LYMPHATICS: No lymphadenopathy CARDIAC: RRR, no murmurs, rubs, gallops RESPIRATORY:  Clear to auscultation without rales, wheezing or rhonchi  ABDOMEN: Soft, non-tender, non-distended MUSCULOSKELETAL:  No edema; No deformity  SKIN: Warm and dry NEUROLOGIC:  Alert and oriented x 3 PSYCHIATRIC:  Normal affect   ASSESSMENT:    1. Chest pain of uncertain etiology   2. Meralgia paresthetica of left side   3. Unstable angina pectoris [facility])    PLAN:    Chest Pain Syndrome DOE concerning for angina equivalent - The patient presents with possibly cardiac - Sublingual nitroglycerin as need for chest pain.  - Would recommend CCTA with possible FFR as needed to exclude obstructive CAD and to assess for non-obstructive CAD requiring secondary prevention  Essential Hypertension - ambulatory blood pressure 145/95, will continue ambulatory BP monitoring; gave education on how to perform ambulatory blood pressure monitoring including the frequency and technique; goal ambulatory blood pressure < 135/85 on average - continue home medications with the exception of switching HCTZ for CTZ 25 mg - will get labs in 7-10 days (BMET, Mg) - concern for secondary HTN, will order renal artery duplex, and Aldo/renin - discussed diet (DASH/low sodium), and exercise/weight loss interventions   Asymptomatic thoracic aortic aneurysm - Last at 40, rate  of growth unclear  - will measure during CT Scan to decide follow up  Hyperlipidemia (mixed) -LDL goal less than 100 -continue current statin - gave education on dietary changes - will reassess dosing and goal after CCTA  Meralgia Paraesthetica - discussed weight loss, avoidance of compression and strengthing - rest per primary  2-3 months follow up unless new symptoms or abnormal test results warranting  change in plan  Would be reasonable for  APP Follow up   Medication Adjustments/Labs and Tests Ordered: Current medicines are reviewed at length with the patient today.  Concerns regarding medicines are outlined above.  Orders Placed This Encounter  Procedures  . CT CORONARY MORPH W/CTA COR W/SCORE W/CA W/CM &/OR WO/CM  . CT CORONARY FRACTIONAL FLOW RESERVE DATA PREP  . CT CORONARY FRACTIONAL FLOW RESERVE FLUID ANALYSIS  . Basic metabolic panel  . Magnesium  . Aldosterone + renin activity w/ ratio  . VAS US RENAL ARTERY DUPLEX   Meds ordered this encounter  Medications  . chlorthalidone (HYGROTON) 25 MG tablet    Sig: Take 1 tablet (25 mg total) by mouth daily.    Dispense:  90 tablet    Refill:  3    There are no Patient Instructions on file for this visit.   Signed, Christell Constant, MD  08/21/2020 8:46 AM    [facility]

## 2020-08-21 NOTE — Patient Instructions (Signed)
Medication Instructions:  Your physician has recommended you make the following change in your medication:  1.  STOP Hydrochlorothiazide 2.  START Chlorthalidone 25 mg taking 1 tablet daily  *If you need a refill on your cardiac medications before your next appointment, please call your pharmacy*   Lab Work: 08/31/2020:  COME TO THE OFFICE, ANYTIME AFTER 7:30 FOR BMET, MAG, & ALDOSTERONE    If you have labs (blood work) drawn today and your tests are completely normal, you will receive your results only by: Marland Kitchen MyChart Message (if you have MyChart) OR . A paper copy in the mail If you have any lab test that is abnormal or we need to change your treatment, we will call you to review the results.   Testing/Procedures: Your physician has requested that you have a renal artery duplex. During this test, an ultrasound is used to evaluate blood flow to the kidneys. Allow one hour for this exam. Do not eat after midnight the day before and avoid carbonated beverages. Take your medications as you usually do.  Your physician has requested that you have cardiac CT. Cardiac computed tomography (CT) is a painless test that uses an x-ray machine to take clear, detailed pictures of your heart. For further information please visit [email or web address]. Please follow instruction sheet BELOW:  Your cardiac CT will be scheduled at    [facility] [place], [place] [address] [phone]  At [facility], please arrive at the [facility] main entrance of [facility] 30 minutes prior to test start time. Proceed to the [facility] Radiology Department (first floor) to check-in and test prep.  If scheduled at [facility], please arrive 15 mins early for check-in and test prep.  Please follow these instructions carefully (unless otherwise directed):  On the Night Before the Test: . Be sure to Drink plenty of water. . Do not consume any  caffeinated/decaffeinated beverages or chocolate 12 hours prior to your test. . Do not take any antihistamines 12 hours prior to your test.  On the Day of the Test: . Drink plenty of water. Do not drink any water within one hour of the test. . Do not eat any food 4 hours prior to the test. . You may take your regular medications prior to the test EXCEPT CHLORTHALIDONE.  . Take metoprolol (Lopressor) two hours prior to test. FEMALES- please wear underwire-free bra if available       After the Test: . Drink plenty of water. . After receiving IV contrast, you may experience a mild flushed feeling. This is normal. . On occasion, you may experience a mild rash up to 24 hours after the test. This is not dangerous. If this occurs, you can take Benadryl 25 mg and increase your fluid intake. . If you experience trouble breathing, this can be serious. If it is severe call 911 IMMEDIATELY. If it is mild, please call our office. . If you take any of these medications: Glipizide/Metformin, Avandament, Glucavance, please do not take 48 hours after completing test unless otherwise instructed.   Once we have confirmed authorization from your insurance company, we will call you to set up a date and time for your test. Based on how quickly your insurance processes prior authorizations requests, please allow up to 4 weeks to be contacted for scheduling your Cardiac CT appointment. Be advised that routine Cardiac CT appointments could be scheduled as many as 8 weeks after your provider has  ordered it.  For non-scheduling related questions, please contact the cardiac imaging nurse navigator should you have any questions/concerns: Marchia Bond, Cardiac Imaging Nurse Navigator Burley Saver, Interim Cardiac Imaging Nurse [place] [facility] Direct Office Dial: [phone]   For scheduling needs, including cancellations and rescheduling, please call [place],  [phone].    Follow-Up: At [facility], you and your health needs are our priority.  As part of our continuing mission to provide you with exceptional heart care, we have created designated Provider Care Teams.  These Care Teams include your primary Cardiologist (physician) and Advanced Practice Providers (APPs -  Physician Assistants and Nurse Practitioners) who all work together to provide you with the care you need, when you need it.  We recommend signing up for the patient portal called "MyChart".  Sign up information is provided on this After Visit Summary.  MyChart is used to connect with patients for Virtual Visits (Telemedicine).  Patients are able to view lab/test results, encounter notes, upcoming appointments, etc.  Non-urgent messages can be sent to your provider as well.   To learn more about what you can do with MyChart, go to [email or web address].    Your next appointment:   3 month(s)   11/26/2020 ARRIVE AT 7:45  The format for your next appointment:   In Person  Provider:   Rudean Haskell, MD   Other Instructions

## 2020-08-26 ENCOUNTER — Other Ambulatory Visit: Payer: Self-pay

## 2020-08-26 ENCOUNTER — Encounter (HOSPITAL_COMMUNITY): Payer: Self-pay | Admitting: Psychiatry

## 2020-08-26 ENCOUNTER — Telehealth (INDEPENDENT_AMBULATORY_CARE_PROVIDER_SITE_OTHER): Payer: No Payment, Other | Admitting: Psychiatry

## 2020-08-26 DIAGNOSIS — F411 Generalized anxiety disorder: Secondary | ICD-10-CM

## 2020-08-26 DIAGNOSIS — F331 Major depressive disorder, recurrent, moderate: Secondary | ICD-10-CM | POA: Diagnosis not present

## 2020-08-26 DIAGNOSIS — F4310 Post-traumatic stress disorder, unspecified: Secondary | ICD-10-CM | POA: Diagnosis not present

## 2020-08-26 MED ORDER — QUETIAPINE FUMARATE 100 MG PO TABS: 100.0000 mg | ORAL_TABLET | Freq: Every day | ORAL | 2 refills | Status: DC

## 2020-08-26 MED ORDER — BUSPIRONE HCL 15 MG PO TABS: 15.0000 mg | ORAL_TABLET | Freq: Three times a day (TID) | ORAL | 2 refills | Status: DC

## 2020-08-26 MED ORDER — SERTRALINE HCL 100 MG PO TABS: 150.0000 mg | ORAL_TABLET | Freq: Every day | ORAL | 2 refills | Status: DC

## 2020-08-26 MED ORDER — HYDROXYZINE HCL 10 MG PO TABS: 10.0000 mg | ORAL_TABLET | Freq: Three times a day (TID) | ORAL | 2 refills | Status: DC | PRN

## 2020-08-26 NOTE — Progress Notes (Signed)
BH MD/PA/NP OP Progress Note Virtual Visit via Telephone Note  I connected with Crystal Winters on 08/26/20 at 11:30 AM EST by telephone and verified that I am speaking with the correct person using two identifiers.  Location: Patient: home Provider: Clinic   I discussed the limitations, risks, security and privacy concerns of performing an evaluation and management service by telephone and the availability of in person appointments. I also discussed with the patient that there may be a patient responsible charge related to this service. The patient expressed understanding and agreed to proceed.   I provided 30 minutes of non-face-to-face time during this encounter.      08/26/2020 12:10 PM Crystal Winters  MRN:  [id number]  Chief Complaint: "My anxiety has not improved"  HPI: 50 year old female seen today for follow-up psychiatric evaluation.  She has a psychiatric history of PTSD, anxiety, depression, and insomnia.  She is currently managed on Zoloft 100 mg daily, Seroquel 100 mg nightly, hydroxyzine 10 mg three times daily as needed, and BuSpar 15 mg 3 times daily.  She notes her medications are somewhat effective in managing her psychiatric conditions.   Today patient was unable to login virtually so her exam was done over the phone. During exam she was cooperative, engaged in conversation, and pleasant.  She describes her mood as depressed and anxious. Provider conducted a GAD 7 and patient scored an 18, at last visit she scored an 18. Provider also conducted a PHQ 9 and patient scored a 15, at last visit she scored a 21. She notes her anxiety and depression are exacerbated by life stressors. She notes that she and her fiance continue to have problems. Today she denies SI/HI/VAH or paranoia. She notes that her she sleeps 5-6 hours nightly and reports having a poor appetite. Patient notes she went for a sleep study on 06/04/2020. She notes that she was informed she has sleep apnea and the  doctor recommended she lose weight to help improve her sleep apnea.   Patient is agreeable to increase Zoloft 100 mg to 150 mg to help manage anxiety and depression. She will continue all other medications as prescribed. She will also follow up with counselor for therapy.  No other concerns noted at this time.   Visit Diagnosis:    ICD-10-CM   1. Moderate episode of recurrent major depressive disorder (HCC)  F33.1 sertraline (ZOLOFT) 100 MG tablet    QUEtiapine (SEROQUEL) 100 MG tablet    busPIRone (BUSPAR) 15 MG tablet  2. PTSD (post-traumatic stress disorder)  F43.10 QUEtiapine (SEROQUEL) 100 MG tablet  3. GAD (generalized anxiety disorder)  F41.1 sertraline (ZOLOFT) 100 MG tablet    hydrOXYzine (ATARAX/VISTARIL) 10 MG tablet    busPIRone (BUSPAR) 15 MG tablet    Past Psychiatric History: PTSD, GAD, Depression, insomnia  Past Medical History:  Past Medical History:  Diagnosis Date  . Anemia    Whole life  . Back pain    L/S spine with left radiculopathy  . Diverticulosis of colon with hemorrhage 09/24/2018  . Hypertension 1997  . Hypocalcemia   . Lupus (HCC) 2012   Joint complaints, fatigue, soft tissue swelling, maybe kidney involvement.    . Proteinuria 05/2019  . Vertigo     Past Surgical History:  Procedure Laterality Date  . BREAST REDUCTION SURGERY  2007  . CHOLECYSTECTOMY  1997  . COLONOSCOPY WITH PROPOFOL N/A 09/27/2018   Procedure: COLONOSCOPY WITH PROPOFOL;  Surgeon: Graylin ShiverGanem, [place] F, MD;  Location: [facility];  Service:  Endoscopy;  Laterality: N/A;  . FLEXIBLE SIGMOIDOSCOPY N/A 10/03/2018   Procedure: FLEXIBLE SIGMOIDOSCOPY;  Surgeon: Kathi Der, MD;  Location: [facility];  Service: Gastroenterology;  Laterality: N/A;  . TOE SURGERY Right    Bunionectomy  . TUBAL LIGATION  1992  . VAGINAL HYSTERECTOMY  1999    Family Psychiatric History: Mother alcohol use   Family History:  Family History  Problem Relation Age of Onset  . Hepatitis C Father    . Kidney disease Father        Not sure if due to SLE or if he has SLE  . Lung disease Father        has had 1/2 lung removed for unknown reason  . Thyroid disease Father        Sounds more like hyperthyroidism--can't gain weight.  . Rectal cancer Other   . Hypertension Other   . Diabetes Other   . Breast cancer Other   . Colon cancer Other   . Prostate cancer Other   . Stroke Other   . CAD Other   . Coronary artery disease Other   . Lupus Sister   . Asthma Son   . Kidney disease Paternal Aunt        Likely due to SLE  . Lupus Paternal Aunt   . Kidney disease Paternal Grandmother        kidney disease--not clear if had SLE    Social History:  Social History   Socioeconomic History  . Marital status: Media planner    Spouse name: Divorced  . Number of children: 3  . Years of education: Not on file  . Highest education level: Bachelor's degree (e.g., BA, AB, BS)  Occupational History  . Occupation: Mental Health Professional  Tobacco Use  . Smoking status: Never Smoker  . Smokeless tobacco: Never Used  Vaping Use  . Vaping Use: Never used  Substance and Sexual Activity  . Alcohol use: Yes    Comment: Wine occasionally  . Drug use: Never  . Sexual activity: Yes    Birth control/protection: Surgical  Other Topics Concern  . Not on file  Social History Narrative   Working on [employer] in [employer] at home with fiance.   2 dogs, Criss Alvine and Duke   Social Determinants of Health   Financial Resource Strain: Low Risk   . Difficulty of Paying Living Expenses: Not hard at all  Food Insecurity: No Food Insecurity  . Worried About Programme researcher, broadcasting/film/video in the Last Year: Never true  . Ran Out of Food in the Last Year: Never true  Transportation Needs: No Transportation Needs  . Lack of Transportation (Medical): No  . Lack of Transportation (Non-Medical): No  Physical Activity: Not on file  Stress: Stress Concern Present  . Feeling of Stress : Very  much  Social Connections: Not on file    Allergies:  Allergies  Allergen Reactions  . Prochlorperazine Other (See Comments), Swelling and Palpitations    Muscle stiffness Muscle stiffness    Metabolic Disorder Labs: Lab Results  Component Value Date   HGBA1C 4.5 (L) 05/01/2020   No results found for: PROLACTIN Lab Results  Component Value Date   CHOL 205 (H) 06/30/2020   TRIG 83 06/30/2020   HDL 64 06/30/2020   CHOLHDL 3.1 10/12/2018   LDLCALC 126 (H) 06/30/2020   LDLCALC 140 (H) 12/27/2019   Lab Results  Component Value Date   TSH 1.360 12/27/2019  TSH 1.190 10/12/2018    Therapeutic Level Labs: No results found for: LITHIUM No results found for: VALPROATE No components found for:  CBMZ  Current Medications: Current Outpatient Medications  Medication Sig Dispense Refill  . amLODipine (NORVASC) 10 MG tablet Take 1 tablet (10 mg total) by mouth daily. 30 tablet 11  . aspirin EC 81 MG tablet Take 1 tablet (81 mg total) by mouth daily. Swallow whole. 30 tablet 11  . atorvastatin (LIPITOR) 20 MG tablet 1 tab by mouth with evening meal daily 30 tablet 11  . busPIRone (BUSPAR) 15 MG tablet Take 1 tablet (15 mg total) by mouth 3 (three) times daily. 90 tablet 2  . chlorthalidone (HYGROTON) 25 MG tablet Take 1 tablet (25 mg total) by mouth daily. 90 tablet 3  . dicyclomine (BENTYL) 20 MG tablet 1 tab by mouth every 6 hours as needed for bloating 30 tablet 11  . hydroxychloroquine (PLAQUENIL) 200 MG tablet Take 1 tablet (200 mg total) by mouth 2 (two) times daily. 60 tablet 11  . hydrOXYzine (ATARAX/VISTARIL) 10 MG tablet Take 1 tablet (10 mg total) by mouth 3 (three) times daily as needed. 90 tablet 2  . isosorbide mononitrate (IMDUR) 30 MG 24 hr tablet Take 1 tablet (30 mg total) by mouth daily. 90 tablet 3  . loratadine (CLARITIN) 10 MG tablet Take 1 tablet (10 mg total) by mouth daily. 30 tablet 11  . losartan (COZAAR) 100 MG tablet Take 1 tablet (100 mg total) by mouth  daily. 30 tablet 11  . meclizine (ANTIVERT) 25 MG tablet Take 1 tablet (25 mg total) by mouth 3 (three) times daily as needed for dizziness. 30 tablet 3  . metoprolol tartrate (LOPRESSOR) 50 MG tablet 1 1/2 tabs by mouth twice daily 90 tablet 11  . nitroGLYCERIN (NITROSTAT) 0.4 MG SL tablet Place 1 tablet (0.4 mg total) under the tongue every 5 (five) minutes as needed for chest pain. 25 tablet 1  . promethazine (PHENERGAN) 25 MG tablet Take 1 tablet (25 mg total) by mouth every 8 (eight) hours as needed for nausea or vomiting. 30 tablet 0  . QUEtiapine (SEROQUEL) 100 MG tablet Take 1 tablet (100 mg total) by mouth at bedtime. 30 tablet 2  . sertraline (ZOLOFT) 100 MG tablet Take 1.5 tablets (150 mg total) by mouth daily. 45 tablet 2   No current facility-administered medications for this visit.     Musculoskeletal: Strength & Muscle Tone: Unable to assess due to telephone visit Gait & Station: Unable to assess due to telephone visit Patient leans: N/A  Psychiatric Specialty Exam: Review of Systems  There were no vitals taken for this visit.There is no height or weight on file to calculate BMI.  General Appearance: Unable to assess due to telephone visit  Eye Contact:  Unable to assess due to telephone visit  Speech:  Clear and Coherent and Normal Rate  Volume:  Normal  Mood:  Anxious and Depressed  Affect:  Appropriate and Congruent  Thought Process:  Coherent, Goal Directed and Linear  Orientation:  Full (Time, Place, and Person)  Thought Content: WDL and Logical   Suicidal Thoughts:  No  Homicidal Thoughts:  No  Memory:  Immediate;   Good Recent;   Good Remote;   Good  Judgement:  Good  Insight:  Good  Psychomotor Activity:  Normal  Concentration:  Concentration: Good and Attention Span: Good  Recall:  Good  Fund of Knowledge: Good  Language: Good  Akathisia:  No  Handed:  Right  AIMS (if indicated):Not done  Assets:  Communication Skills Desire for  Improvement Financial Resources/Insurance Housing Intimacy Social Support  ADL's:  Intact  Cognition: WNL  Sleep:  Fair   Screenings: GAD-7   Flowsheet Row Video Visit from 08/26/2020 in [facility] Video Visit from 05/26/2020 in [facility] Office Visit from 09/25/2019 in [facility]  Total GAD-7 Score 18 18 18     PHQ2-9   Flowsheet Row Video Visit from 08/26/2020 in [facility] Video Visit from 05/26/2020 in [facility] Counselor from 02/12/2020 in [facility] Office Visit from 09/25/2019 in [facility] Office Visit from 05/06/2019 in [facility]  PHQ-2 Total Score 4 5 2 6 1   PHQ-9 Total Score 15 21 10 23  --       Assessment and Plan: Patient endorses symptoms of anxiety and depression. She is agreeable to increase Zoloft 100 mg to 150 mg to help manage anxiety and depression. She will continue all other medications as prescribed  1. Moderate episode of recurrent major depressive disorder (HCC)  Increased- sertraline (ZOLOFT) 100 MG tablet; Take 1.5 tablets (150 mg total) by mouth daily.  Dispense: 45 tablet; Refill: 2 Continue- QUEtiapine (SEROQUEL) 100 MG tablet; Take 1 tablet (100 mg total) by mouth at bedtime.  Dispense: 30 tablet; Refill: 2 Continue- busPIRone (BUSPAR) 15 MG tablet; Take 1 tablet (15 mg total) by mouth 3 (three) times daily.  Dispense: 90 tablet; Refill: 2 2. PTSD (post-traumatic stress disorder)  Continue- QUEtiapine (SEROQUEL) 100 MG tablet; Take 1 tablet (100 mg total) by mouth at bedtime.  Dispense: 30 tablet; Refill: 2  3. GAD (generalized anxiety disorder)  Continue- hydrOXYzine (ATARAX/VISTARIL) 10 MG tablet; Take 1 tablet (10 mg total) by mouth 3 (three) times daily as needed.  Dispense: 90 tablet; Refill: 2 Continue- busPIRone (BUSPAR) 15 MG tablet; Take 1  tablet (15 mg total) by mouth 3 (three) times daily.  Dispense: 90 tablet; Refill: 2 Increased- sertraline (ZOLOFT) 100 MG tablet; Take 1.5 tablets (150 mg total) by mouth daily.  Dispense: 45 tablet; Refill: 2  Follow up in 3 months Follow up with therapy   [place], NP 08/26/2020, 12:10 PM

## 2020-08-28 ENCOUNTER — Telehealth (HOSPITAL_COMMUNITY): Payer: Self-pay | Admitting: Emergency Medicine

## 2020-08-28 NOTE — Telephone Encounter (Signed)
Reaching out to patient to offer assistance regarding upcoming cardiac imaging study; pt verbalizes understanding of appt date/time, parking situation and where to check in, pre-test NPO status and medications ordered, and verified current allergies; name and call back number provided for further questions should they arise Rockwell Alexandria RN Navigator Cardiac Imaging [facility] [phone] office [phone] cell   Pt instructed to hold daily claritin (or take at bedtime) and take other daily meds per usual (resting HR 60s) [place] Dec

## 2020-08-31 ENCOUNTER — Other Ambulatory Visit: Payer: PRIVATE HEALTH INSURANCE | Admitting: *Deleted

## 2020-08-31 ENCOUNTER — Other Ambulatory Visit: Payer: Self-pay

## 2020-08-31 DIAGNOSIS — R079 Chest pain, unspecified: Secondary | ICD-10-CM

## 2020-08-31 DIAGNOSIS — G5712 Meralgia paresthetica, left lower limb: Secondary | ICD-10-CM

## 2020-08-31 DIAGNOSIS — I200 Unstable angina: Secondary | ICD-10-CM

## 2020-09-01 ENCOUNTER — Ambulatory Visit (HOSPITAL_COMMUNITY)
Admission: RE | Admit: 2020-09-01 | Discharge: 2020-09-01 | Disposition: A | Discharge status: Home / Self Care | Payer: Self-pay | Source: Ambulatory Visit | Attending: Internal Medicine | Admitting: Internal Medicine

## 2020-09-01 ENCOUNTER — Telehealth: Payer: Self-pay | Admitting: *Deleted

## 2020-09-01 ENCOUNTER — Encounter: Payer: Self-pay | Admitting: *Deleted

## 2020-09-01 DIAGNOSIS — I200 Unstable angina: Secondary | ICD-10-CM

## 2020-09-01 DIAGNOSIS — Z006 Encounter for examination for normal comparison and control in clinical research program: Secondary | ICD-10-CM

## 2020-09-01 DIAGNOSIS — E876 Hypokalemia: Secondary | ICD-10-CM

## 2020-09-01 MED ORDER — NITROGLYCERIN 0.4 MG SL SUBL
SUBLINGUAL_TABLET | SUBLINGUAL | Status: AC
  Filled 2020-09-01: qty 2

## 2020-09-01 MED ORDER — ONDANSETRON HCL 4 MG/2ML IJ SOLN
4.0000 mg | Freq: Once | INTRAMUSCULAR | Status: AC
  Administered 2020-09-01: 4 mg via INTRAVENOUS

## 2020-09-01 MED ORDER — NITROGLYCERIN 0.4 MG SL SUBL
0.8000 mg | SUBLINGUAL_TABLET | Freq: Once | SUBLINGUAL | Status: AC
  Administered 2020-09-01: 0.8 mg via SUBLINGUAL

## 2020-09-01 MED ORDER — ONDANSETRON HCL 4 MG/2ML IJ SOLN
INTRAMUSCULAR | Status: AC
  Filled 2020-09-01: qty 2

## 2020-09-01 MED ORDER — POTASSIUM CHLORIDE CRYS ER 20 MEQ PO TBCR: 40.0000 meq | EXTENDED_RELEASE_TABLET | Freq: Every day | ORAL | 3 refills | Status: AC

## 2020-09-01 MED ORDER — IOHEXOL 350 MG/ML SOLN
75.0000 mL | Freq: Once | INTRAVENOUS | Status: AC | PRN
  Administered 2020-09-01: 75 mL via INTRAVENOUS

## 2020-09-01 NOTE — Telephone Encounter (Signed)
Orders placed and lab appointment set. Patient verbalized understanding.

## 2020-09-01 NOTE — Telephone Encounter (Signed)
-----   Message from Christell Constant, MD sent at 09/01/2020 11:07 AM EST ----- Results: Hypokalemia on increased strength diuretic Stable Mg Plan: Start Potassium 40 meq; BMP in 7-10 days   Christell Constant, MD

## 2020-09-02 ENCOUNTER — Telehealth: Payer: Self-pay

## 2020-09-02 ENCOUNTER — Ambulatory Visit (HOSPITAL_COMMUNITY)
Admission: RE | Admit: 2020-09-02 | Discharge: 2020-09-02 | Disposition: A | Discharge status: Home / Self Care | Payer: Self-pay | Source: Ambulatory Visit | Attending: Cardiovascular Disease | Admitting: Cardiovascular Disease

## 2020-09-02 ENCOUNTER — Other Ambulatory Visit: Payer: Self-pay

## 2020-09-02 ENCOUNTER — Other Ambulatory Visit: Payer: Self-pay | Admitting: Internal Medicine

## 2020-09-02 DIAGNOSIS — I10 Essential (primary) hypertension: Secondary | ICD-10-CM

## 2020-09-02 DIAGNOSIS — I200 Unstable angina: Secondary | ICD-10-CM | POA: Insufficient documentation

## 2020-09-02 DIAGNOSIS — R079 Chest pain, unspecified: Secondary | ICD-10-CM

## 2020-09-02 DIAGNOSIS — G5712 Meralgia paresthetica, left lower limb: Secondary | ICD-10-CM

## 2020-09-02 NOTE — Telephone Encounter (Signed)
-----   Message from Christell Constant, MD sent at 09/02/2020  8:31 AM EST ----- Results: No evidence of coronary calcium or blockages or significant dilation of the aorta Plan: Keep LDL goal; will continue to work on blood pressure  Christell Constant, MD

## 2020-09-02 NOTE — Telephone Encounter (Signed)
MD notes sent through mychart, left patient a message to return call to office with questions.

## 2020-09-07 LAB — BASIC METABOLIC PANEL
BUN/Creatinine Ratio: 17 (ref 9–23)
BUN: 19 mg/dL (ref 6–24)
CO2: 22 mmol/L (ref 20–29)
Calcium: 9.5 mg/dL (ref 8.7–10.2)
Chloride: 101 mmol/L (ref 96–106)
Creatinine, Ser: 1.15 mg/dL — ABNORMAL HIGH (ref 0.57–1.00)
GFR calc Af Amer: 65 mL/min/{1.73_m2} (ref >59)
GFR calc non Af Amer: 56 mL/min/{1.73_m2} — ABNORMAL LOW (ref >59)
Glucose: 137 mg/dL — ABNORMAL HIGH (ref 65–99)
Potassium: 3.2 mmol/L — ABNORMAL LOW (ref 3.5–5.2)
Sodium: 138 mmol/L (ref 134–144)

## 2020-09-07 LAB — ALDOSTERONE + RENIN ACTIVITY W/ RATIO
ALDOS/RENIN RATIO: 3.3 (ref 0.0–30.0)
ALDOSTERONE: 5.3 ng/dL (ref 0.0–30.0)
Renin: 1.585 ng/mL/hr (ref 0.167–5.380)

## 2020-09-07 LAB — MAGNESIUM: Magnesium: 1.8 mg/dL (ref 1.6–2.3)

## 2020-09-08 NOTE — Research (Signed)
IDENTIFY Informed Consent                  Subject Name:   Crystal Winters   Subject met inclusion and exclusion criteria.  The informed consent form, study requirements and expectations were reviewed with the subject and questions and concerns were addressed prior to the signing of the consent form.  The subject verbalized understanding of the trial requirements.  The subject agreed to participate in the IDENTIFY trial and signed the informed consent.  The informed consent was obtained prior to performance of any protocol-specific procedures for the subject.  A copy of the signed informed consent was given to the subject and a copy was placed in the subject's medical record.   [name], Research Assistant  09/01/2020 09:09 a.ma.

## 2020-09-10 ENCOUNTER — Other Ambulatory Visit: Payer: Self-pay

## 2020-09-10 ENCOUNTER — Ambulatory Visit (INDEPENDENT_AMBULATORY_CARE_PROVIDER_SITE_OTHER): Payer: No Payment, Other | Admitting: Clinical

## 2020-09-10 ENCOUNTER — Other Ambulatory Visit: Payer: PRIVATE HEALTH INSURANCE

## 2020-09-10 DIAGNOSIS — F331 Major depressive disorder, recurrent, moderate: Secondary | ICD-10-CM

## 2020-09-10 DIAGNOSIS — E876 Hypokalemia: Secondary | ICD-10-CM

## 2020-09-10 LAB — BASIC METABOLIC PANEL
BUN/Creatinine Ratio: 16 (ref 9–23)
BUN: 16 mg/dL (ref 6–24)
CO2: 25 mmol/L (ref 20–29)
Calcium: 9.1 mg/dL (ref 8.7–10.2)
Chloride: 103 mmol/L (ref 96–106)
Creatinine, Ser: 1.03 mg/dL — ABNORMAL HIGH (ref 0.57–1.00)
GFR calc Af Amer: 74 mL/min/{1.73_m2} (ref >59)
GFR calc non Af Amer: 64 mL/min/{1.73_m2} (ref >59)
Glucose: 80 mg/dL (ref 65–99)
Potassium: 4.1 mmol/L (ref 3.5–5.2)
Sodium: 141 mmol/L (ref 134–144)

## 2020-09-12 NOTE — Progress Notes (Signed)
   THERAPIST PROGRESS NOTE Virtual Visit via Video Note  I connected with Crystal Winters on 09/10/2020 at 11:00 AM EST by a video enabled telemedicine application and verified that I am speaking with the correct person using two identifiers.  Location: Patient: community Provider: office   I discussed the limitations of evaluation and management by telemedicine and the availability of in person appointments. The patient expressed understanding and agreed to proceed.   Follow Up Instructions: I discussed the assessment and treatment plan with the patient. The patient was provided an opportunity to ask questions and all were answered. The patient agreed with the plan and demonstrated an understanding of the instructions.   The patient was advised to call back or seek an in-person evaluation if the symptoms worsen or if the condition fails to improve as anticipated.   Session Time: 30 minutes  Participation Level: Active  Behavioral Response: CasualAlertEuthymic  Type of Therapy: Individual Therapy  Treatment Goals addressed: Diagnosis: depression  Interventions: CBT  Summary:  Crystal Winters is a 50 y.o. female who presents for the scheduled session oriented times five, appropriately dressed, and friendly. Client denied hallucinations and delusions. Client reported on today feeling good. Client since the last session she enjoyed the holidays with her son who is based in [place]. Client reported she is thinking about going back again around her birthday. Client reported she was on her way to the doctors office to have more blood work done regarding her Lupus diagnosis. Client reported she has been working on doing things within her control when it comes to her mother and fiance. Client reported her mother relapsed on alcohol which negatively impacts their relationship but she has boundaries. Client reported she gets irritated with her fiances insecurities. Client reported she has decided  to give him a deadline this year to make improvements or she will end the relationships. Client reported his insecurities has been the reason she has not married him yet.     Suicidal/Homicidal: Nowithout intent/plan  Therapist Response:  Therapist began by asking the client how she has been doing since last seen. Therapist actively listened to the clients thoughts and feelings. Therapist used CBT to engage the client in discussion about providing stability by making useful boundaries to promote emotional well being. Client was scheduled for next appointment   Plan: Return again in 5 weeks for individual therapy.  Diagnosis: Moderate episode of recurrent major depressive disorder  Neena Rhymes Brittin Janik, LCSW 09/10/2020

## 2020-09-21 ENCOUNTER — Other Ambulatory Visit: Payer: Medicaid Other | Admitting: Internal Medicine

## 2020-10-01 ENCOUNTER — Other Ambulatory Visit: Payer: Self-pay

## 2020-10-01 ENCOUNTER — Ambulatory Visit (HOSPITAL_COMMUNITY): Payer: No Payment, Other | Admitting: Clinical

## 2020-10-09 ENCOUNTER — Telehealth: Payer: Self-pay | Admitting: Internal Medicine

## 2020-10-09 NOTE — Telephone Encounter (Signed)
Patient called asking for an appointment. Patient stated that she is been having pain on her left side body for a week, and last night started to get more intense. The pain is more concentrated in the hip and lower back. No other symptoms and it took 2 advil tablets but it did not helped. Please advise.

## 2020-10-12 NOTE — Telephone Encounter (Signed)
Patient's appointment was schedule for 11/05/2020, and was placed in a waiting list. Patient also verbally expressed her understanding that if she needs to be seen earlier, she can go to an urgent care.

## 2020-10-13 ENCOUNTER — Encounter (HOSPITAL_COMMUNITY): Payer: Self-pay

## 2020-10-13 ENCOUNTER — Emergency Department (HOSPITAL_COMMUNITY)
Admission: EM | Admit: 2020-10-13 | Discharge: 2020-10-13 | Disposition: A | Discharge status: Home / Self Care | Payer: Self-pay | Attending: Emergency Medicine | Admitting: Emergency Medicine

## 2020-10-13 ENCOUNTER — Other Ambulatory Visit: Payer: Self-pay

## 2020-10-13 ENCOUNTER — Emergency Department (HOSPITAL_COMMUNITY): Payer: Self-pay

## 2020-10-13 DIAGNOSIS — I10 Essential (primary) hypertension: Secondary | ICD-10-CM | POA: Insufficient documentation

## 2020-10-13 DIAGNOSIS — Z79899 Other long term (current) drug therapy: Secondary | ICD-10-CM | POA: Insufficient documentation

## 2020-10-13 DIAGNOSIS — Z7982 Long term (current) use of aspirin: Secondary | ICD-10-CM | POA: Insufficient documentation

## 2020-10-13 DIAGNOSIS — M5417 Radiculopathy, lumbosacral region: Secondary | ICD-10-CM | POA: Insufficient documentation

## 2020-10-13 DIAGNOSIS — M25552 Pain in left hip: Secondary | ICD-10-CM | POA: Insufficient documentation

## 2020-10-13 MED ORDER — KETOROLAC TROMETHAMINE 30 MG/ML IJ SOLN
60.0000 mg | Freq: Once | INTRAMUSCULAR | Status: AC
  Administered 2020-10-13: 60 mg via INTRAMUSCULAR
  Filled 2020-10-13: qty 2

## 2020-10-13 MED ORDER — KETOROLAC TROMETHAMINE 10 MG PO TABS: 10.0000 mg | ORAL_TABLET | Freq: Four times a day (QID) | ORAL | 0 refills | Status: DC | PRN

## 2020-10-13 MED ORDER — PREDNISONE 20 MG PO TABS: 60.0000 mg | ORAL_TABLET | Freq: Every day | ORAL | 0 refills | Status: AC

## 2020-10-13 NOTE — ED Triage Notes (Signed)
Patient c/o left lower back pain that radiates into the left leg x months and worse in the past few days.

## 2020-10-13 NOTE — ED Notes (Signed)
An After Visit Summary was printed and given to the patient. Discharge instructions given and no further questions at this time.  

## 2020-10-13 NOTE — ED Provider Notes (Signed)
[facility]-EMERGENCY DEPT Provider Note   CSN: [id number] Arrival date & time: 10/13/20  1155     History Chief Complaint  Patient presents with  . Back Pain    Crystal Winters is a 50 y.o. female.  The history is provided by the patient.  Back Pain Location:  Lumbar spine Quality:  Aching Radiates to:  L posterior upper leg, L knee and L foot Pain severity:  Severe Pain is:  Same all the time Onset quality:  Gradual Duration:  2 months Timing:  Constant Progression:  Worsening Chronicity:  New Context comment:  History of lupus. No injury. Relieved by:  Nothing Worsened by:  Ambulation and movement (Cannot lie on that side.  Has pain with hip motion.) Ineffective treatments:  NSAIDs Associated symptoms: numbness   Associated symptoms: no abdominal pain, no chest pain, no dysuria, no fever, no perianal numbness, no tingling, no weakness and no weight loss   Associated symptoms comment:  Left leg feels numb.      Past Medical History:  Diagnosis Date  . Anemia    Whole life  . Back pain    L/S spine with left radiculopathy  . Diverticulosis of colon with hemorrhage 09/24/2018  . Hypertension 1997  . Hypocalcemia   . Lupus (HCC) 2012   Joint complaints, fatigue, soft tissue swelling, maybe kidney involvement.    . Proteinuria 05/2019  . Vertigo     Patient Active Problem List   Diagnosis Date Noted  . Meralgia paresthetica of left side 08/21/2020  . Chest pain of uncertain etiology 08/21/2020  . Chest pain 06/30/2020  . Snoring 06/04/2020  . Moderate episode of recurrent major depressive disorder (HCC) 02/15/2020  . PTSD (post-traumatic stress disorder) 02/12/2020  . GAD (generalized anxiety disorder) 02/12/2020  . MDD (major depressive disorder) 02/12/2020  . Dental decay 12/27/2019  . Thyromegaly 12/27/2019  . Mixed hyperlipidemia 12/27/2019  . Easy bruising 12/27/2019  . Anemia   . Back pain   . Trauma and stressor-related  disorder 11/20/2019  . Seasonal allergies 05/08/2019  . Breast pain, left 02/19/2019  . Anxiety 02/19/2019  . Insomnia 02/19/2019  . Elevated blood pressure reading with diagnosis of hypertension 02/19/2019  . Exacerbation of systemic lupus (HCC) 12/25/2018  . 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 Hemorrhage of colon following colonoscopy 11/07/2018  . Acute blood loss anemia   . Acute GI bleeding 10/02/2018  . ARF (acute renal failure) (HCC) 10/02/2018  . Lower GI bleed 09/24/2018  . Essential hypertension 09/24/2018  . Syncope 09/24/2018  . Systemic lupus erythematosus (HCC) 09/24/2018  . Hypokalemia 09/24/2018  . Diverticulosis of colon with hemorrhage 09/24/2018  . Hypertension 1997    Past Surgical History:  Procedure Laterality Date  . BREAST REDUCTION SURGERY  2007  . CHOLECYSTECTOMY  1997  . COLONOSCOPY WITH PROPOFOL N/A 09/27/2018   Procedure: COLONOSCOPY WITH PROPOFOL;  Surgeon: Graylin Shiver, MD;  Location: [facility];  Service: Endoscopy;  Laterality: N/A;  . FLEXIBLE SIGMOIDOSCOPY N/A 10/03/2018   Procedure: FLEXIBLE SIGMOIDOSCOPY;  Surgeon: Kathi Der, MD;  Location: [facility];  Service: Gastroenterology;  Laterality: N/A;  . TOE SURGERY Right    Bunionectomy  . TUBAL LIGATION  1992  . VAGINAL HYSTERECTOMY  1999     OB History    Gravida  4   Para      Term      Preterm      AB  Living  3     SAB      IAB      Ectopic      Multiple      Live Births              Family History  Problem Relation Age of Onset  . Hepatitis C Father   . Kidney disease Father        Not sure if due to SLE or if he has SLE  . Lung disease Father        has had 1/2 lung removed for unknown reason  . Thyroid disease Father        Sounds more like hyperthyroidism--can't gain weight.  . Rectal cancer Other   . Hypertension Other   . Diabetes Other   . Breast cancer Other   . Colon cancer Other   . Prostate  cancer Other   . Stroke Other   . CAD Other   . Coronary artery disease Other   . Lupus Sister   . Asthma Son   . Kidney disease Paternal Aunt        Likely due to SLE  . Lupus Paternal Aunt   . Kidney disease Paternal Grandmother        kidney disease--not clear if had SLE    Social History   Tobacco Use  . Smoking status: Never Smoker  . Smokeless tobacco: Never Used  Vaping Use  . Vaping Use: Never used  Substance Use Topics  . Alcohol use: Yes    Comment: Wine occasionally  . Drug use: Never    Home Medications Prior to Admission medications   Medication Sig Start Date End Date Taking? Authorizing Provider  amLODipine (NORVASC) 10 MG tablet Take 1 tablet (10 mg total) by mouth daily. 09/25/19   Julieanne Manson, MD  aspirin EC 81 MG tablet Take 1 tablet (81 mg total) by mouth daily. Swallow whole. 04/14/20   Julieanne Manson, MD  atorvastatin (LIPITOR) 20 MG tablet 1 tab by mouth with evening meal daily 07/26/20   Julieanne Manson, MD  busPIRone (BUSPAR) 15 MG tablet Take 1 tablet (15 mg total) by mouth 3 (three) times daily. 08/26/20   Shanna Cisco, NP  chlorthalidone (HYGROTON) 25 MG tablet Take 1 tablet (25 mg total) by mouth daily. 08/21/20 11/19/20  Christell Constant, MD  dicyclomine (BENTYL) 20 MG tablet 1 tab by mouth every 6 hours as needed for bloating 12/27/19   Julieanne Manson, MD  hydroxychloroquine (PLAQUENIL) 200 MG tablet Take 1 tablet (200 mg total) by mouth 2 (two) times daily. 06/30/20   Julieanne Manson, MD  hydrOXYzine (ATARAX/VISTARIL) 10 MG tablet Take 1 tablet (10 mg total) by mouth 3 (three) times daily as needed. 08/26/20   Shanna Cisco, NP  isosorbide mononitrate (IMDUR) 30 MG 24 hr tablet Take 1 tablet (30 mg total) by mouth daily. 08/12/20 11/10/20  Julieanne Manson, MD  loratadine (CLARITIN) 10 MG tablet Take 1 tablet (10 mg total) by mouth daily. 05/06/19   [name], FNP  losartan (COZAAR) 100 MG tablet Take 1  tablet (100 mg total) by mouth daily. 09/25/19   Julieanne Manson, MD  meclizine (ANTIVERT) 25 MG tablet Take 1 tablet (25 mg total) by mouth 3 (three) times daily as needed for dizziness. 10/12/18   [name], FNP  metoprolol tartrate (LOPRESSOR) 50 MG tablet 1 1/2 tabs by mouth twice daily 04/14/20   Julieanne Manson, MD  nitroGLYCERIN (NITROSTAT)  0.4 MG SL tablet Place 1 tablet (0.4 mg total) under the tongue every 5 (five) minutes as needed for chest pain. 04/14/20   Julieanne MansonMulberry, Elizabeth, MD  potassium chloride SA (KLOR-CON M20) 20 MEQ tablet Take 2 tablets (40 mEq total) by mouth daily. 09/01/20   Christell Constanthandrasekhar, Mahesh A, MD  promethazine (PHENERGAN) 25 MG tablet Take 1 tablet (25 mg total) by mouth every 8 (eight) hours as needed for nausea or vomiting. 08/12/20   Julieanne MansonMulberry, Elizabeth, MD  QUEtiapine (SEROQUEL) 100 MG tablet Take 1 tablet (100 mg total) by mouth at bedtime. 08/26/20   Shanna CiscoParsons, Brittney E, NP  sertraline (ZOLOFT) 100 MG tablet Take 1.5 tablets (150 mg total) by mouth daily. 08/26/20   Shanna CiscoParsons, Brittney E, NP  traZODone (DESYREL) 150 MG tablet Take 1 tablet (150 mg total) by mouth at bedtime. 07/10/19 08/12/19  Kallie LocksStroud, Natalie M, FNP    Allergies    Prochlorperazine  Review of Systems   Review of Systems  Constitutional: Negative for chills, fever and weight loss.  HENT: Negative for ear pain and sore throat.   Eyes: Negative for pain and visual disturbance.  Respiratory: Negative for cough and shortness of breath.   Cardiovascular: Negative for chest pain and palpitations.  Gastrointestinal: Negative for abdominal pain and vomiting.  Genitourinary: Negative for dysuria and hematuria.  Musculoskeletal: Positive for back pain. Negative for arthralgias.  Skin: Negative for color change and rash.  Neurological: Positive for numbness. Negative for tingling, seizures, syncope and weakness.  All other systems reviewed and are negative.   Physical Exam Updated Vital Signs BP  (!) 190/114 (BP Location: Right Arm)   Pulse 71   Temp 97.8 F (36.6 C) (Oral)   Resp 18   Ht 6' (1.829 m)   Wt 93 kg   SpO2 100%   BMI 27.80 kg/m   Physical Exam Vitals and nursing note reviewed.  HENT:     Head: Normocephalic and atraumatic.  Eyes:     General: No scleral icterus. Pulmonary:     Effort: Pulmonary effort is normal. No respiratory distress.  Musculoskeletal:     Cervical back: Normal range of motion.     Comments: The patient has tenderness to palpation of the lower lumbar spine.  Range of motion for flexion and extension is limited secondary to pain.  She also has pain to palpation at the left lateral hip.  Hip range of motion is slightly limited and painful especially for internal rotation.  Otherwise extremity is atraumatic.  No deformity.  Warm and well perfused.  Sensation intact.  Skin:    General: Skin is warm and dry.  Neurological:     General: No focal deficit present.     Mental Status: She is alert and oriented to person, place, and time.     Motor: No weakness.     Gait: Gait normal.  Psychiatric:        Mood and Affect: Mood normal.     ED Results / Procedures / Treatments   Labs (all labs ordered are listed, but only abnormal results are displayed) Labs Reviewed - No data to display  EKG None  Radiology DG Lumbar Spine Complete  Result Date: 10/13/2020 CLINICAL DATA:  Low back pain radiating into the left hip for several months, worse over the past few days. EXAM: LUMBAR SPINE - COMPLETE 4+ VIEW COMPARISON:  None. FINDINGS: No fracture. Trace retrolisthesis L3 on L4 noted. Loss of disc space height and endplate spurring are seen  at L2-3. Minimal anterior endplate spurring R6-7 is also noted. Paraspinous structures are negative. IMPRESSION: No acute abnormality. 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 Electronically Signed   By: Drusilla Kanner M.D.   On: 10/13/2020 13:55   DG Hip Unilat With Pelvis 2-3 Views Left  Result Date:  10/13/2020 CLINICAL DATA:  Low back pain radiating into the left hip for several months, worse over the past few days. EXAM: DG HIP (WITH OR WITHOUT PELVIS) 2-3V LEFT COMPARISON:  None. FINDINGS: There is no evidence of hip fracture or dislocation. There is no evidence of arthropathy or other focal bone abnormality. IMPRESSION: Normal exam. Electronically Signed   By: Drusilla Kanner M.D.   On: 10/13/2020 13:56    Procedures Procedures   Medications Ordered in ED Medications - No data to display  ED Course  I have reviewed the triage vital signs and the nursing notes.  Pertinent labs & imaging results that were available during my care of the patient were reviewed by me and considered in my medical decision making (see chart for details).    MDM Rules/Calculators/A&P                          Patient is 50 years old with a history of hypertension, hyperlipidemia, and lupus.  She presents with several months of atraumatic left-sided lumbosacral pain with radiation to her leg.  After my exam, I think her pain is likely a combination between a lumbosacral radiculopathy and possibly some hip arthritis.  She also has lupus, and I am considering lupus flare.  However she has had this pain for several months, and it seems less likely a lupus flare and more likely a musculoskeletal source.  No signs or symptoms of infection.  No neurologic deficits.  She was treated symptomatically.  X-rays are unremarkable.  She was referred to orthopedics if symptoms persist. Final Clinical Impression(s) / ED Diagnoses Final diagnoses:  Lumbosacral radiculopathy  Hip pain, left    Rx / DC Orders ED Discharge Orders         Ordered    predniSONE (DELTASONE) 20 MG tablet  Daily        10/13/20 1419    ketorolac (TORADOL) 10 MG tablet  Every 6 hours PRN        10/13/20 1420           Koleen Distance, MD 10/13/20 1424

## 2020-11-05 ENCOUNTER — Encounter: Payer: Self-pay | Admitting: Internal Medicine

## 2020-11-05 ENCOUNTER — Other Ambulatory Visit: Payer: Self-pay

## 2020-11-05 ENCOUNTER — Other Ambulatory Visit (HOSPITAL_COMMUNITY): Payer: Self-pay | Admitting: Psychiatry

## 2020-11-05 ENCOUNTER — Ambulatory Visit: Payer: Medicaid Other | Admitting: Internal Medicine

## 2020-11-05 VITALS — BP 149/91 | HR 71 | Resp 12 | Ht 70.0 in | Wt 208.0 lb

## 2020-11-05 DIAGNOSIS — G8929 Other chronic pain: Secondary | ICD-10-CM

## 2020-11-05 DIAGNOSIS — F411 Generalized anxiety disorder: Secondary | ICD-10-CM

## 2020-11-05 DIAGNOSIS — F331 Major depressive disorder, recurrent, moderate: Secondary | ICD-10-CM

## 2020-11-05 DIAGNOSIS — M7062 Trochanteric bursitis, left hip: Secondary | ICD-10-CM

## 2020-11-05 DIAGNOSIS — F4310 Post-traumatic stress disorder, unspecified: Secondary | ICD-10-CM

## 2020-11-05 DIAGNOSIS — M5442 Lumbago with sciatica, left side: Secondary | ICD-10-CM

## 2020-11-05 MED ORDER — HYDROXYZINE HCL 10 MG PO TABS: 10.0000 mg | ORAL_TABLET | Freq: Three times a day (TID) | ORAL | 0 refills | Status: DC | PRN

## 2020-11-05 MED ORDER — BUSPIRONE HCL 15 MG PO TABS: 15.0000 mg | ORAL_TABLET | Freq: Three times a day (TID) | ORAL | 0 refills | Status: DC

## 2020-11-05 MED ORDER — DICLOFENAC SODIUM 75 MG PO TBEC: DELAYED_RELEASE_TABLET | ORAL | 6 refills | Status: DC

## 2020-11-05 MED ORDER — QUETIAPINE FUMARATE 100 MG PO TABS: 100.0000 mg | ORAL_TABLET | Freq: Every day | ORAL | 0 refills | Status: DC

## 2020-11-05 MED ORDER — SERTRALINE HCL 100 MG PO TABS: 150.0000 mg | ORAL_TABLET | Freq: Every day | ORAL | 0 refills | Status: DC

## 2020-11-05 NOTE — Patient Instructions (Signed)
[facility]:  [phone]  

## 2020-11-05 NOTE — Progress Notes (Signed)
Subjective:    Patient ID: Crystal Winters, female   DOB: 1971-03-24, 50 y.o.   MRN: [id number]   HPI   1.  Left thigh pain which radiates up to low left back.  Unable to sleep on that side.  Sleeping on the right side and now her right shoulder hurts she feels because of this.  States her mattress is fine and only 50 years old.   Pain left thigh is constant.  When bad, radiates only down lateral thigh. States has had for 4 years and just seems to be worsening.  No history of injury.   States has also always had back issues since she was a child. Xrays of L/S spine and left pelvis hip on 10/13/20 when went to ED for same problem showed no abnormality of hip and DDD of L2-3 with some endplate spurring at that level as well.   Pt. Received prednisone and Toradol and pain is much better, but still there.   Most of pain up in high lateral groin area and not so much on greater trochanter at this point, but states she was tender over greater trochanter previously and bursitis was mentioned to her as a diagnosis.    Current Meds  Medication Sig  . amLODipine (NORVASC) 10 MG tablet Take 1 tablet (10 mg total) by mouth daily.  Marland Kitchen aspirin EC 81 MG tablet Take 1 tablet (81 mg total) by mouth daily. Swallow whole.  Marland Kitchen atorvastatin (LIPITOR) 20 MG tablet 1 tab by mouth with evening meal daily  . busPIRone (BUSPAR) 15 MG tablet Take 1 tablet (15 mg total) by mouth 3 (three) times daily.  Marland Kitchen dicyclomine (BENTYL) 20 MG tablet 1 tab by mouth every 6 hours as needed for bloating  . hydroxychloroquine (PLAQUENIL) 200 MG tablet Take 1 tablet (200 mg total) by mouth 2 (two) times daily.  . isosorbide mononitrate (IMDUR) 30 MG 24 hr tablet Take 1 tablet (30 mg total) by mouth daily.  Marland Kitchen loratadine (CLARITIN) 10 MG tablet Take 1 tablet (10 mg total) by mouth daily.  Marland Kitchen losartan (COZAAR) 100 MG tablet Take 1 tablet (100 mg total) by mouth daily.  . metoprolol tartrate (LOPRESSOR) 50 MG tablet 1 1/2 tabs by mouth twice  daily  . nitroGLYCERIN (NITROSTAT) 0.4 MG SL tablet Place 1 tablet (0.4 mg total) under the tongue every 5 (five) minutes as needed for chest pain.  . potassium chloride SA (KLOR-CON M20) 20 MEQ tablet Take 2 tablets (40 mEq total) by mouth daily.  . QUEtiapine (SEROQUEL) 100 MG tablet Take 1 tablet (100 mg total) by mouth at bedtime.  . sertraline (ZOLOFT) 100 MG tablet Take 1.5 tablets (150 mg total) by mouth daily.   Allergies  Allergen Reactions  . Prochlorperazine Other (See Comments), Swelling and Palpitations    Muscle stiffness Muscle stiffness     Review of Systems    Objective:   BP (!) 149/91 (BP Location: Left Arm, Patient Position: Sitting, Cuff Size: Normal)   Pulse 71   Resp 12   Ht 5\' 10"  (1.778 m)   Wt 208 lb (94.3 kg)   BMI 29.84 kg/m   Physical Exam  NAD Lungs:  CTA CV:  RRR without murmur or rub.  Radial pulses normal and equal.   Spine:  No significant tenderness over L/S spinous processes.  Tender left lumbar paraspinous musculature and high lateral inguinal ligament.  Minimal tenderness over left greater trochanter.   Assessment & Plan   1.  DDD L/S spine and what sounds like greater trochanteric bursitis.  Despite groin pain, xrays do not show DJD of hip.   Sending to PT and to ortho.  Discussed she will need to apply for financial assistance with [facility] for ED visit and will then be able to see how much decrease in cost for ortho as well. No other anti inflammatories other than the ASA and Diclofenac, to take once daily with food for next 2 weeks or up to twice daily if needed for evening as well.  Thereafter, only as needed. Has a CPE end of May.

## 2020-11-09 DIAGNOSIS — M7062 Trochanteric bursitis, left hip: Secondary | ICD-10-CM

## 2020-11-09 HISTORY — DX: Trochanteric bursitis, left hip: M70.62

## 2020-11-13 ENCOUNTER — Other Ambulatory Visit: Payer: Self-pay

## 2020-11-13 ENCOUNTER — Ambulatory Visit (INDEPENDENT_AMBULATORY_CARE_PROVIDER_SITE_OTHER): Payer: Self-pay | Admitting: Family Medicine

## 2020-11-13 ENCOUNTER — Encounter: Payer: Self-pay | Admitting: Family Medicine

## 2020-11-13 DIAGNOSIS — M545 Low back pain, unspecified: Secondary | ICD-10-CM

## 2020-11-13 DIAGNOSIS — G8929 Other chronic pain: Secondary | ICD-10-CM

## 2020-11-13 DIAGNOSIS — M25552 Pain in left hip: Secondary | ICD-10-CM

## 2020-11-13 DIAGNOSIS — R2242 Localized swelling, mass and lump, left lower limb: Secondary | ICD-10-CM

## 2020-11-13 NOTE — Progress Notes (Signed)
Office Visit Note   Patient: Crystal Winters           Date of Birth: 01-01-1971           MRN: [id number] Visit Date: 11/13/2020 Requested by: Julieanne Manson, MD [address] [place],  [place] [address] PCP: Julieanne Manson, MD  Subjective: Chief Complaint  Patient presents with  . Lower Back - Pain    Chronic pain in the middle of the lower back and over to the left. Worsening. NKI.   Marland Kitchen Left Hip - Pain    Pain in the lateral left hip x 4 years. NKI. Pain down to the knee. Numbness in the leg to the foot.     HPI: She is here with low back and left hip pain.  Symptoms are primarily in the left anterolateral hip, and have been progressively worsening for the past 4 years.  She states that even when she was a child she had troubles with her back and hip and over the years she was given stretches, various medications but nothing really seemed to give her lasting relief.  She has noticed a nodule that seems to be getting bigger.  She was told it might be bursitis.  She has had x-rays of the hip and lumbar spine recently which I was able to review today, no significant bony abnormality of the hip and only mild degenerative changes in her lumbar spine.  No radicular symptoms.  No fevers or chills.                ROS: No unintended weight change.  All other systems were reviewed and are negative.  Objective: Vital Signs: There were no vitals taken for this visit.  Physical Exam:  General:  Alert and oriented, in no acute distress. Pulm:  Breathing unlabored. Psy:  Normal mood, congruent affect. Skin: No rash. Left hip: She has tenderness in the lumbar spine over the L5-S1 level.  No significant SI tenderness.  No pain in the sciatic notch.  Straight leg raise negative.  Left hip range of motion is good.  There is a palpable soft tissue mass anterior laterally, roughly in the region of the tensor fascia lata muscle.  It feels roughly 2 to 3 cm diameter, and does not have distinct  margins.  She is also tender over the greater trochanter.    Imaging: No results found.  Assessment & Plan: 1.  Chronic left hip pain with soft tissue mass, getting bigger per patient report. -We will order MRI with IV contrast to further evaluate.  If unremarkable, then physical therapy.     Procedures: No procedures performed        PMFS History: Patient Active Problem List   Diagnosis Date Noted  . Greater trochanteric bursitis, left 11/09/2020  . Meralgia paresthetica of left side 08/21/2020  . Chest pain of uncertain etiology 08/21/2020  . Chest pain 06/30/2020  . Snoring 06/04/2020  . Moderate episode of recurrent major depressive disorder (HCC) 02/15/2020  . PTSD (post-traumatic stress disorder) 02/12/2020  . GAD (generalized anxiety disorder) 02/12/2020  . MDD (major depressive disorder) 02/12/2020  . Dental decay 12/27/2019  . Thyromegaly 12/27/2019  . Mixed hyperlipidemia 12/27/2019  . Easy bruising 12/27/2019  . Anemia   . Back pain   . 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 Seasonal allergies 05/08/2019  . Breast pain, left 02/19/2019  . Anxiety 02/19/2019  . Insomnia 02/19/2019  . Elevated blood pressure reading with diagnosis of  hypertension 02/19/2019  . Exacerbation of systemic lupus (HCC) 12/25/2018  . 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 Hemorrhage of colon following colonoscopy 11/07/2018  . Acute blood loss anemia   . Acute GI bleeding 10/02/2018  . ARF (acute renal failure) (HCC) 10/02/2018  . Lower GI bleed 09/24/2018  . Essential hypertension 09/24/2018  . Syncope 09/24/2018  . Systemic lupus erythematosus (HCC) 09/24/2018  . Hypokalemia 09/24/2018  . Diverticulosis of colon with hemorrhage 09/24/2018  . Hypertension 1997   Past Medical History:  Diagnosis Date  . Anemia    Whole life  . Back pain    L/S spine with left radiculopathy  . Diverticulosis of colon with hemorrhage  09/24/2018  . Hypertension 1997  . Hypocalcemia   . Lupus (HCC) 2012   Joint complaints, fatigue, soft tissue swelling, maybe kidney involvement.    . Proteinuria 05/2019  . Vertigo     Family History  Problem Relation Age of Onset  . Hepatitis C Father   . Kidney disease Father        Not sure if due to SLE or if he has SLE  . Lung disease Father        has had 1/2 lung removed for unknown reason  . Thyroid disease Father        Sounds more like hyperthyroidism--can't gain weight.  . Rectal cancer Other   . Hypertension Other   . Diabetes Other   . Breast cancer Other   . Colon cancer Other   . Prostate cancer Other   . Stroke Other   . CAD Other   . Coronary artery disease Other   . Lupus Sister   . Asthma Son   . Kidney disease Paternal Aunt        Likely due to SLE  . Lupus Paternal Aunt   . Kidney disease Paternal Grandmother        kidney disease--not clear if had SLE    Past Surgical History:  Procedure Laterality Date  . BREAST REDUCTION SURGERY  2007  . CHOLECYSTECTOMY  1997  . COLONOSCOPY WITH PROPOFOL N/A 09/27/2018   Procedure: COLONOSCOPY WITH PROPOFOL;  Surgeon: Graylin Shiver, MD;  Location: [facility];  Service: Endoscopy;  Laterality: N/A;  . FLEXIBLE SIGMOIDOSCOPY N/A 10/03/2018   Procedure: FLEXIBLE SIGMOIDOSCOPY;  Surgeon: Kathi Der, MD;  Location: [facility];  Service: Gastroenterology;  Laterality: N/A;  . TOE SURGERY Right    Bunionectomy  . TUBAL LIGATION  1992  . VAGINAL HYSTERECTOMY  1999   Social History   Occupational History  . Occupation: Mental Health Professional  Tobacco Use  . Smoking status: Never Smoker  . Smokeless tobacco: Never Used  Vaping Use  . Vaping Use: Never used  Substance and Sexual Activity  . Alcohol use: Yes    Comment: Wine occasionally  . Drug use: Never  . Sexual activity: Yes    Birth control/protection: Surgical

## 2020-11-16 NOTE — Progress Notes (Signed)
Cardiology Office Note:    Date:  11/26/2020   ID:  Crystal Winters, DOB 1971-04-25, MRN [id number]  PCP:  Julieanne Manson, MD  [facility] Cardiologist:  Christell Constant, MD  [facility] Electrophysiologist:  None   Referring MD: Julieanne Manson, MD   CC: CP follow up.  History of Present Illness:    Crystal Winters is a 50 y.o. female with a hx of HTN,  HLD, SLE on Plaquenil, and Anemia who presented for evaluation 04/22/20.  Since last visit, started on Imdur, had normal echocardiogram, and started moderate intensity statin. Seen 08/2020.  In interim of this visit, patient CCTA without Aortic Dilation or CAC.  Seen 11/26/20.  Patient notes that she is doing Ok.  Since last visit notes no changes.  Relevant interval testing or therapy include getting a hip MRI this week. Had recurrent ED visit for L hip pain.  No chest pain or pressure .  No SOB/DOE and no PND/Orthopnea.  No weight gain or leg swelling.  No palpitations or syncope.  Ambulatory blood pressure 130/80 unless having hip pain.  Past Medical History:  Diagnosis Date  . Acute GI bleeding 10/02/2018  . Anemia    Whole life  . Anxiety 02/19/2019  . ARF (acute renal failure) (HCC) 10/02/2018  . Back pain    L/S spine with left radiculopathy  . Dental decay 12/27/2019  . Diverticulosis of colon with hemorrhage 09/24/2018  . Elevated blood pressure reading with diagnosis of hypertension 02/19/2019  . Exacerbation of systemic lupus (HCC) 12/25/2018  . Greater trochanteric bursitis, left 11/09/2020  . Hemorrhage of colon following colonoscopy 11/07/2018  . Hypertension 1997  . Hypocalcemia   . Lupus (HCC) 2012   Joint complaints, fatigue, soft tissue swelling, maybe kidney involvement.    . MDD (major depressive disorder) 02/12/2020  . Meralgia paresthetica of left side 08/21/2020  . Mixed hyperlipidemia 12/27/2019  . Proteinuria 05/2019  . PTSD (post-traumatic stress disorder) 02/12/2020  . Snoring 06/04/2020  .  Systemic lupus erythematosus (HCC) 09/24/2018  . Thyromegaly 12/27/2019  . 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 Vertigo     Past Surgical History:  Procedure Laterality Date  . BREAST REDUCTION SURGERY  2007  . CHOLECYSTECTOMY  1997  . COLONOSCOPY WITH PROPOFOL N/A 09/27/2018   Procedure: COLONOSCOPY WITH PROPOFOL;  Surgeon: Graylin Shiver, MD;  Location: [facility];  Service: Endoscopy;  Laterality: N/A;  . FLEXIBLE SIGMOIDOSCOPY N/A 10/03/2018   Procedure: FLEXIBLE SIGMOIDOSCOPY;  Surgeon: Kathi Der, MD;  Location: [facility];  Service: Gastroenterology;  Laterality: N/A;  . TOE SURGERY Right    Bunionectomy  . TUBAL LIGATION  1992  . VAGINAL HYSTERECTOMY  1999   Current Medications: Current Meds  Medication Sig  . amLODipine (NORVASC) 10 MG tablet Take 1 tablet (10 mg total) by mouth daily.  Marland Kitchen aspirin EC 81 MG tablet Take 1 tablet (81 mg total) by mouth daily. Swallow whole.  Marland Kitchen atorvastatin (LIPITOR) 20 MG tablet 1 tab by mouth with evening meal daily  . busPIRone (BUSPAR) 15 MG tablet Take 1 tablet (15 mg total) by mouth 3 (three) times daily.  . chlorthalidone (HYGROTON) 25 MG tablet Take 1 tablet (25 mg total) by mouth daily.  . diclofenac (VOLTAREN) 75 MG EC tablet 1 tab by mouth twice daily as needed with food.  . dicyclomine (BENTYL) 20 MG tablet 1 tab by mouth every 6 hours as needed for bloating  . hydroxychloroquine (PLAQUENIL) 200 MG tablet Take 1  tablet (200 mg total) by mouth 2 (two) times daily.  . hydrOXYzine (ATARAX/VISTARIL) 10 MG tablet Take 1 tablet (10 mg total) by mouth 3 (three) times daily as needed.  . isosorbide mononitrate (IMDUR) 30 MG 24 hr tablet Take 1 tablet (30 mg total) by mouth daily.  Marland Kitchen loratadine (CLARITIN) 10 MG tablet Take 1 tablet (10 mg total) by mouth daily.  Marland Kitchen losartan (COZAAR) 100 MG tablet Take 1 tablet (100 mg total) by mouth daily.  . meclizine (ANTIVERT) 25 MG tablet Take 1 tablet (25 mg total) by mouth 3 (three)  times daily as needed for dizziness.  . metoprolol tartrate (LOPRESSOR) 50 MG tablet 1 1/2 tabs by mouth twice daily  . nitroGLYCERIN (NITROSTAT) 0.4 MG SL tablet Place 1 tablet (0.4 mg total) under the tongue every 5 (five) minutes as needed for chest pain.  . potassium chloride SA (KLOR-CON M20) 20 MEQ tablet Take 2 tablets (40 mEq total) by mouth daily.  . QUEtiapine (SEROQUEL) 100 MG tablet Take 1.5 tablets (150 mg total) by mouth at bedtime.  . sertraline (ZOLOFT) 100 MG tablet Take 1.5 tablets (150 mg total) by mouth daily.    Allergies:   Prochlorperazine   Social History   Socioeconomic History  . Marital status: Media planner    Spouse name: Divorced  . Number of children: 3  . Years of education: Not on file  . Highest education level: Bachelor's degree (e.g., BA, AB, BS)  Occupational History  . Occupation: Mental Health Professional  Tobacco Use  . Smoking status: Never Smoker  . Smokeless tobacco: Never Used  Vaping Use  . Vaping Use: Never used  Substance and Sexual Activity  . Alcohol use: Yes    Comment: Wine occasionally  . Drug use: Never  . Sexual activity: Yes    Birth control/protection: Surgical  Other Topics Concern  . Not on file  Social History Narrative   Working on [employer] in [employer] at home with fiance.   2 dogs, Criss Alvine and Duke   Social Determinants of Health   Financial Resource Strain: Low Risk   . Difficulty of Paying Living Expenses: Not hard at all  Food Insecurity: No Food Insecurity  . Worried About Programme researcher, broadcasting/film/video in the Last Year: Never true  . Ran Out of Food in the Last Year: Never true  Transportation Needs: No Transportation Needs  . Lack of Transportation (Medical): No  . Lack of Transportation (Non-Medical): No  Physical Activity: Not on file  Stress: Not on file  Social Connections: Not on file    Social: Works in [employer]; son recently stationed in the Child psychotherapist at [place]  Family  History: The patient's family history includes Asthma in her son; Breast cancer in an other family member; CAD in an other family member; Colon cancer in an other family member; Coronary artery disease in an other family member; Diabetes in an other family member; Hepatitis C in her father; Hypertension in an other family member; Kidney disease in her father, paternal aunt, and paternal grandmother; Lung disease in her father; Lupus in her paternal aunt and sister; Prostate cancer in an other family member; Rectal cancer in an other family member; Stroke in an other family member; Thyroid disease in her father. Grandmother and mother with arrhythmia. Aunt and cousins had CAD with bypass  ROS:   Please see the history of present illness.    All other systems reviewed and are negative.  EKGs/Labs/Other Studies Reviewed:    The following studies were reviewed today:  EKG:   04/14/20:  Sinus rhythm rate 67 no ST/T changes or pathologic q waves; QTc 422  Transthoracic Echocardiogram: Date: 04/24/20 Results: Mild Thoracic Aortic Index 2.1 IMPRESSIONS  1. Left ventricular ejection fraction, by estimation, is 60 to 65%. The  left ventricle has normal function. The left ventricle has no regional  wall motion abnormalities. There is mild concentric left ventricular  hypertrophy. Left ventricular diastolic  parameters are indeterminate. The average left ventricular global  longitudinal strain is -24.9 %. The global longitudinal strain is normal.  2. Right ventricular systolic function is normal. The right ventricular  size is normal. There is normal pulmonary artery systolic pressure. The  estimated right ventricular systolic pressure is 14.0 mmHg.  3. The mitral valve is normal in structure. Trivial mitral valve  regurgitation. No evidence of mitral stenosis.  4. The aortic valve is normal in structure. Aortic valve regurgitation is  not visualized. No aortic stenosis is present.  5. Aortic  dilatation noted. There is mild dilatation of the ascending  aorta, measuring 38 mm.  6. The inferior vena cava is normal in size with greater than 50%  respiratory variability, suggesting right atrial pressure of 3 mmHg.   Cardiac CT: Date: 09/01/20 Results: IMPRESSION: 1. Coronary calcium score of 0. This was 1st percentile for age, sex, and race matched control. 2. Normal coronary origin with right dominance. 3. CAD-RADS 0. No evidence of CAD (0%). Consider non-atherosclerotic causes of chest pain. 4. Normal AA  Recent Labs: 12/27/2019: TSH 1.360 06/22/2020: Hemoglobin 14.9; Platelets 246 06/30/2020: ALT 10 08/31/2020: Magnesium 1.8 09/10/2020: BUN 16; Creatinine, Ser 1.03; Potassium 4.1; Sodium 141  Recent Lipid Panel    Component Value Date/Time   CHOL 205 (H) 06/30/2020 1058   TRIG 83 06/30/2020 1058   HDL 64 06/30/2020 1058   CHOLHDL 3.1 10/12/2018 1112   LDLCALC 126 (H) 06/30/2020 1058    Physical Exam:    VS:  BP 130/80   Pulse (!) 59   Ht 6' (1.829 m)   Wt 213 lb 3.2 oz (96.7 kg)   SpO2 96%   BMI 28.92 kg/m     Wt Readings from Last 3 Encounters:  11/26/20 213 lb 3.2 oz (96.7 kg)  11/05/20 208 lb (94.3 kg)  10/13/20 205 lb (93 kg)   GEN: Well nourished, well developed in no acute distress HEENT: Normal  NECK: No JVD; No carotid bruits LYMPHATICS: No lymphadenopathy CARDIAC: RRR, no murmurs, rubs, gallops RESPIRATORY:  Clear to auscultation without rales, wheezing or rhonchi  ABDOMEN: Soft, non-tender, non-distended MUSCULOSKELETAL:  No edema; No deformity  SKIN: Warm and dry NEUROLOGIC:  Alert and oriented x 3 PSYCHIATRIC:  Normal affect   ASSESSMENT:    1. Essential hypertension   2. Hyperlipidemia, unspecified hyperlipidemia type   3. Preoperative cardiovascular examination    PLAN:     Essential Hypertension - ambulatory blood pressure at goal, will continue ambulatory BP monitoring; gave education on how to perform ambulatory blood  pressure monitoring including the frequency and technique; goal ambulatory blood pressure < 130/80 on average - continue home medications  - discussed diet (DASH/low sodium), and exercise/weight loss interventions   Thoracic Aortic Dilation (ULN) - Last at 236- will evaluate symptoms and potentially reimagine in 2024 - reviewed imaging with patient (CCTA)  Hyperlipidemia (mixed) -LDL goal less than 100 -continue current statin - gave education on dietary changes - SHARED DECISION MAKING: Discussed  options for more aggressive statin therapy in the setting of R hip pain and no CAC; will check lipids at next follow up and decide increase statin based on this  Reasonable for surgery (hip) without further cardiac testing unless new issues occur.   One year follow up unless new symptoms or abnormal test results warranting change in plan     Medication Adjustments/Labs and Tests Ordered: Current medicines are reviewed at length with the patient today.  Concerns regarding medicines are outlined above.  No orders of the defined types were placed in this encounter.  No orders of the defined types were placed in this encounter.   Patient Instructions  Medication Instructions:  Your physician recommends that you continue on your current medications as directed. Please refer to the Current Medication list given to you today.  *If you need a refill on your cardiac medications before your next appointment, please call your pharmacy*   Lab Work: NONE If you have labs (blood work) drawn today and your tests are completely normal, you will receive your results only by: Marland Kitchen MyChart Message (if you have MyChart) OR . A paper copy in the mail If you have any lab test that is abnormal or we need to change your treatment, we will call you to review the results.   Testing/Procedures: NONE   Follow-Up: At [facility], you and your health needs are our priority.  As part of our continuing  mission to provide you with exceptional heart care, we have created designated Provider Care Teams.  These Care Teams include your primary Cardiologist (physician) and Advanced Practice Providers (APPs -  Physician Assistants and Nurse Practitioners) who all work together to provide you with the care you need, when you need it.  We recommend signing up for the patient portal called "MyChart".  Sign up information is provided on this After Visit Summary.  MyChart is used to connect with patients for Virtual Visits (Telemedicine).  Patients are able to view lab/test results, encounter notes, upcoming appointments, etc.  Non-urgent messages can be sent to your provider as well.   To learn more about what you can do with MyChart, go to [email or web address].    Your next appointment:   12 month(s)  The format for your next appointment:   In Person  Provider:   You may see Christell Constant, MD  or one of the following Advanced Practice Providers on your designated Care Team:    Ronie Spies, PA-C  Jacolyn Reedy, PA-C          Signed, Christell Constant, MD  11/26/2020 8:34 AM    [facility]

## 2020-11-18 ENCOUNTER — Telehealth: Payer: Self-pay | Admitting: *Deleted

## 2020-11-18 NOTE — Telephone Encounter (Signed)
Returned call to pt in reference to hasnt heard anything about MRI, tried calling pt left vm to return my call.

## 2020-11-19 NOTE — Telephone Encounter (Signed)
Returned pts called and advised her that Tiffany with [facility] [facility] is looking into seeing if she needs labs. I gave number to pt and she will call imaging to see why she isnt scheduled yet.

## 2020-11-24 ENCOUNTER — Other Ambulatory Visit: Payer: Self-pay

## 2020-11-24 ENCOUNTER — Telehealth (INDEPENDENT_AMBULATORY_CARE_PROVIDER_SITE_OTHER): Payer: No Payment, Other | Admitting: Psychiatry

## 2020-11-24 ENCOUNTER — Encounter (HOSPITAL_COMMUNITY): Payer: Self-pay | Admitting: Psychiatry

## 2020-11-24 ENCOUNTER — Telehealth: Payer: Self-pay | Admitting: Internal Medicine

## 2020-11-24 DIAGNOSIS — F4310 Post-traumatic stress disorder, unspecified: Secondary | ICD-10-CM

## 2020-11-24 DIAGNOSIS — F331 Major depressive disorder, recurrent, moderate: Secondary | ICD-10-CM

## 2020-11-24 DIAGNOSIS — F411 Generalized anxiety disorder: Secondary | ICD-10-CM | POA: Diagnosis not present

## 2020-11-24 MED ORDER — AMLODIPINE BESYLATE 10 MG PO TABS
10.0000 mg | ORAL_TABLET | Freq: Every day | ORAL | 11 refills | Status: AC
Start: 2020-11-24 — End: ?

## 2020-11-24 MED ORDER — HYDROXYZINE HCL 10 MG PO TABS
10.0000 mg | ORAL_TABLET | Freq: Three times a day (TID) | ORAL | 2 refills | Status: DC | PRN
Start: 2020-11-24 — End: 2021-02-23

## 2020-11-24 MED ORDER — SERTRALINE HCL 100 MG PO TABS: 150.0000 mg | ORAL_TABLET | Freq: Every day | ORAL | 0 refills | Status: DC

## 2020-11-24 MED ORDER — BUSPIRONE HCL 15 MG PO TABS: 15.0000 mg | ORAL_TABLET | Freq: Three times a day (TID) | ORAL | 2 refills | Status: DC

## 2020-11-24 MED ORDER — QUETIAPINE FUMARATE 100 MG PO TABS: 150.0000 mg | ORAL_TABLET | Freq: Every day | ORAL | 2 refills | Status: DC

## 2020-11-24 MED ORDER — LOSARTAN POTASSIUM 100 MG PO TABS: 100.0000 mg | ORAL_TABLET | Freq: Every day | ORAL | 11 refills | Status: AC

## 2020-11-24 NOTE — Progress Notes (Signed)
BH MD/PA/NP OP Progress Note Virtual Visit via Video Note  I connected with Crystal Winters on 11/24/20 at 11:30 AM EDT by a video enabled telemedicine application and verified that I am speaking with the correct person using two identifiers.  Location: Patient: Home Provider: Clinic   I discussed the limitations of evaluation and management by telemedicine and the availability of in person appointments. The patient expressed understanding and agreed to proceed.  I provided 30 minutes of non-face-to-face time during this encounter.        11/24/2020 11:48 AM Crystal Winters  MRN:  [id number]  Chief Complaint: "I am in so much pain but my mental health is pretty stable."  HPI: 49 year old female seen today for follow-up psychiatric evaluation.  She has a psychiatric history of PTSD, anxiety, depression, and insomnia.  She is currently managed on Zoloft 150 mg daily, Seroquel 100 mg nightly, hydroxyzine 10 mg three times daily as needed, and BuSpar 15 mg 3 times daily.  She notes her medications are somewhat effective in managing her psychiatric conditions.   Today patient was pleasant, cooperative, well-groomed, maintained eye contact, and engaged in conversation.  She informed Clinical research associate that her left hip and right arm continues to be painful.  She notes at times she has difficultly going to sleep because she cannot get comfortable.  She notes that recently she took 2 Seroquel's in order to drift off to sleep faster.  She notes that she follows up with an orthopedic surgeon for further evaluation soon.  She informed Clinical research associate that she was just prescribed diclofenac to help manage her pain.  Provider informed patient that this medications with Zoloft may cause GI bleeding.  She endorsed understanding and notes that it does not work and she will follow-up with her doctor to discuss another regimen.    Patient notes that her pain exacerbates her anxiety and depression however informed writer that she  feels mentally stable.  Today provider conducted a GAD-7 and patient scored a 17, at her last visit she scored an 18.  She notes that she worries about her job, her relationship, and her mother's health.  She notes her mother recently started drinking and she is concerned about her.  Provider also conducted a PHQ-9 and patient scored a 12, at her last visit she scored a 15.  Today she endorses having a poor appetite.  She denies SI/HI/VAH, mania, or paranoia.  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  She notes that she is preparing a luxurious outdoor picnic as well as a woman and department session for her friends.  Patient is agreeable to increase Seroquel 100 mg to 150 mg to help manage anxiety, sleep, and depression. She will continue all other medications as prescribed. She will also follow up with counselor for therapy.  No other concerns noted at this time.   Visit Diagnosis:    ICD-10-CM   1. GAD (generalized anxiety disorder)  F41.1 busPIRone (BUSPAR) 15 MG tablet    hydrOXYzine (ATARAX/VISTARIL) 10 MG tablet    sertraline (ZOLOFT) 100 MG tablet  2. Moderate episode of recurrent major depressive disorder (HCC)  F33.1 busPIRone (BUSPAR) 15 MG tablet    sertraline (ZOLOFT) 100 MG tablet    QUEtiapine (SEROQUEL) 100 MG tablet  3. PTSD (post-traumatic stress disorder)  F43.10 QUEtiapine (SEROQUEL) 100 MG tablet    Past Psychiatric History: PTSD, GAD, Depression, insomnia  Past Medical History:  Past Medical History:  Diagnosis Date  . Acute GI bleeding 10/02/2018  . Anemia    Whole life  . Anxiety 02/19/2019  . ARF (acute renal failure) (HCC) 10/02/2018  . Back pain    L/S spine with left radiculopathy  . Dental decay 12/27/2019  . Diverticulosis of colon with hemorrhage 09/24/2018  . Elevated blood pressure reading with diagnosis of hypertension 02/19/2019  . Exacerbation of systemic lupus (HCC) 12/25/2018  .  Greater trochanteric bursitis, left 11/09/2020  . Hemorrhage of colon following colonoscopy 11/07/2018  . Hypertension 1997  . Hypocalcemia   . Lupus (HCC) 2012   Joint complaints, fatigue, soft tissue swelling, maybe kidney involvement.    . MDD (major depressive disorder) 02/12/2020  . Meralgia paresthetica of left side 08/21/2020  . Mixed hyperlipidemia 12/27/2019  . Proteinuria 05/2019  . PTSD (post-traumatic stress disorder) 02/12/2020  . Snoring 06/04/2020  . Systemic lupus erythematosus (HCC) 09/24/2018  . Thyromegaly 12/27/2019  . 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 Vertigo     Past Surgical History:  Procedure Laterality Date  . BREAST REDUCTION SURGERY  2007  . CHOLECYSTECTOMY  1997  . COLONOSCOPY WITH PROPOFOL N/A 09/27/2018   Procedure: COLONOSCOPY WITH PROPOFOL;  Surgeon: Graylin ShiverGanem, Salem F, MD;  Location: [facility];  Service: Endoscopy;  Laterality: N/A;  . FLEXIBLE SIGMOIDOSCOPY N/A 10/03/2018   Procedure: FLEXIBLE SIGMOIDOSCOPY;  Surgeon: Kathi DerBrahmbhatt, Parag, MD;  Location: [facility];  Service: Gastroenterology;  Laterality: N/A;  . TOE SURGERY Right    Bunionectomy  . TUBAL LIGATION  1992  . VAGINAL HYSTERECTOMY  1999    Family Psychiatric History: Mother alcohol use   Family History:  Family History  Problem Relation Age of Onset  . Hepatitis C Father   . Kidney disease Father        Not sure if due to SLE or if he has SLE  . Lung disease Father        has had 1/2 lung removed for unknown reason  . Thyroid disease Father        Sounds more like hyperthyroidism--can't gain weight.  . Rectal cancer Other   . Hypertension Other   . Diabetes Other   . Breast cancer Other   . Colon cancer Other   . Prostate cancer Other   . Stroke Other   . CAD Other   . Coronary artery disease Other   . Lupus Sister   . Asthma Son   . Kidney disease Paternal Aunt        Likely due to SLE  . Lupus Paternal Aunt   . Kidney disease Paternal Grandmother         kidney disease--not clear if had SLE    Social History:  Social History   Socioeconomic History  . Marital status: Media plannerDomestic Partner    Spouse name: Divorced  . Number of children: 3  . Years of education: Not on file  . Highest education level: Bachelor's degree (e.g., BA, AB, BS)  Occupational History  . Occupation: Mental Health Professional  Tobacco Use  . Smoking status: Never Smoker  . Smokeless tobacco: Never Used  Vaping Use  . Vaping Use: Never used  Substance and Sexual Activity  . Alcohol use: Yes    Comment: Wine occasionally  . Drug use: Never  . Sexual activity: Yes    Birth control/protection: Surgical  Other Topics Concern  . Not on file  Social History Narrative   Working on [employer] in [employer]   Lives  at home with fiance.   2 dogs, Criss Alvine and Duke   Social Determinants of Health   Financial Resource Strain: Low Risk   . Difficulty of Paying Living Expenses: Not hard at all  Food Insecurity: No Food Insecurity  . Worried About Programme researcher, broadcasting/film/video in the Last Year: Never true  . Ran Out of Food in the Last Year: Never true  Transportation Needs: No Transportation Needs  . Lack of Transportation (Medical): No  . Lack of Transportation (Non-Medical): No  Physical Activity: Not on file  Stress: Not on file  Social Connections: Not on file    Allergies:  Allergies  Allergen Reactions  . Prochlorperazine Other (See Comments), Swelling and Palpitations    Muscle stiffness Muscle stiffness    Metabolic Disorder Labs: Lab Results  Component Value Date   HGBA1C 4.5 (L) 05/01/2020   No results found for: PROLACTIN Lab Results  Component Value Date   CHOL 205 (H) 06/30/2020   TRIG 83 06/30/2020   HDL 64 06/30/2020   CHOLHDL 3.1 10/12/2018   LDLCALC 126 (H) 06/30/2020   LDLCALC 140 (H) 12/27/2019   Lab Results  Component Value Date   TSH 1.360 12/27/2019   TSH 1.190 10/12/2018    Therapeutic Level Labs: No results found for:  LITHIUM No results found for: VALPROATE No components found for:  CBMZ  Current Medications: Current Outpatient Medications  Medication Sig Dispense Refill  . amLODipine (NORVASC) 10 MG tablet Take 1 tablet (10 mg total) by mouth daily. 30 tablet 11  . aspirin EC 81 MG tablet Take 1 tablet (81 mg total) by mouth daily. Swallow whole. 30 tablet 11  . atorvastatin (LIPITOR) 20 MG tablet 1 tab by mouth with evening meal daily 30 tablet 11  . busPIRone (BUSPAR) 15 MG tablet Take 1 tablet (15 mg total) by mouth 3 (three) times daily. 90 tablet 2  . chlorthalidone (HYGROTON) 25 MG tablet Take 1 tablet (25 mg total) by mouth daily. (Patient not taking: Reported on 11/05/2020) 90 tablet 3  . diclofenac (VOLTAREN) 75 MG EC tablet 1 tab by mouth twice daily as needed with food. 30 tablet 6  . dicyclomine (BENTYL) 20 MG tablet 1 tab by mouth every 6 hours as needed for bloating 30 tablet 11  . hydroxychloroquine (PLAQUENIL) 200 MG tablet Take 1 tablet (200 mg total) by mouth 2 (two) times daily. 60 tablet 11  . hydrOXYzine (ATARAX/VISTARIL) 10 MG tablet Take 1 tablet (10 mg total) by mouth 3 (three) times daily as needed. 90 tablet 2  . isosorbide mononitrate (IMDUR) 30 MG 24 hr tablet Take 1 tablet (30 mg total) by mouth daily. 90 tablet 3  . loratadine (CLARITIN) 10 MG tablet Take 1 tablet (10 mg total) by mouth daily. 30 tablet 11  . losartan (COZAAR) 100 MG tablet Take 1 tablet (100 mg total) by mouth daily. 30 tablet 11  . meclizine (ANTIVERT) 25 MG tablet Take 1 tablet (25 mg total) by mouth 3 (three) times daily as needed for dizziness. (Patient not taking: Reported on 11/05/2020) 30 tablet 3  . metoprolol tartrate (LOPRESSOR) 50 MG tablet 1 1/2 tabs by mouth twice daily 90 tablet 11  . nitroGLYCERIN (NITROSTAT) 0.4 MG SL tablet Place 1 tablet (0.4 mg total) under the tongue every 5 (five) minutes as needed for chest pain. 25 tablet 1  . potassium chloride SA (KLOR-CON M20) 20 MEQ tablet Take 2  tablets (40 mEq total) by mouth daily. 90 tablet  3  . promethazine (PHENERGAN) 25 MG tablet Take 1 tablet (25 mg total) by mouth every 8 (eight) hours as needed for nausea or vomiting. (Patient not taking: Reported on 11/05/2020) 30 tablet 0  . QUEtiapine (SEROQUEL) 100 MG tablet Take 1.5 tablets (150 mg total) by mouth at bedtime. 45 tablet 2  . sertraline (ZOLOFT) 100 MG tablet Take 1.5 tablets (150 mg total) by mouth daily. 45 tablet 0   No current facility-administered medications for this visit.     Musculoskeletal: Strength & Muscle Tone: Unable to assess due to telehealth visit Gait & Station: Unable to assess due to telehealth visit Patient leans: N/A  Psychiatric Specialty Exam: Review of Systems  There were no vitals taken for this visit.There is no height or weight on file to calculate BMI.  General Appearance: Well Groomed  Eye Contact:  Good  Speech:  Clear and Coherent and Normal Rate  Volume:  Normal  Mood:  Anxious and Depressed  Affect:  Appropriate and Congruent  Thought Process:  Coherent, Goal Directed and Linear  Orientation:  Full (Time, Place, and Person)  Thought Content: WDL and Logical   Suicidal Thoughts:  No  Homicidal Thoughts:  No  Memory:  Immediate;   Good Recent;   Good Remote;   Good  Judgement:  Good  Insight:  Good  Psychomotor Activity:  Normal  Concentration:  Concentration: Good and Attention Span: Good  Recall:  Good  Fund of Knowledge: Good  Language: Good  Akathisia:  No  Handed:  Right  AIMS (if indicated):Not done  Assets:  Communication Skills Desire for Improvement Financial Resources/Insurance Housing Intimacy Social Support  ADL's:  Intact  Cognition: WNL  Sleep:  Fair   Screenings: GAD-7   Flowsheet Row Video Visit from 11/24/2020 in [facility] Video Visit from 08/26/2020 in [facility] Video Visit from 05/26/2020 in [facility]  Office Visit from 09/25/2019 in [facility]  Total GAD-7 Score 17 18 18 18     PHQ2-9   Flowsheet Row Video Visit from 11/24/2020 in [facility] Video Visit from 08/26/2020 in [facility] Video Visit from 05/26/2020 in [facility] Counselor from 02/12/2020 in [facility] Office Visit from 09/25/2019 in [facility]  PHQ-2 Total Score 2 4 5 2 6   PHQ-9 Total Score 12 15 21 10 23     Flowsheet Row Video Visit from 11/24/2020 in [facility] ED from 10/13/2020 in [facility]  C-SSRS RISK CATEGORY Error: Question 1 not populated No Risk       Assessment and Plan: Patient notes that her anxiety and depression are exacerbated by life stressors and pain.  She reports that she feels mentally stable however fears going to sleep because of her increased pain.  She will follow-up with her orthopedic MD for further evaluation.  Today she is agreeable to increasing Seroquel 100 mg to 150 mg to help manage anxiety, sleep, and depression.  She will continue all other medications as prescribed.    1. Moderate episode of recurrent major depressive disorder (HCC)  Increased- sertraline (ZOLOFT) 100 MG tablet; Take 1.5 tablets (150 mg total) by mouth daily.  Dispense: 45 tablet; Refill: 2 Continue- QUEtiapine (SEROQUEL) 150 MG tablet; Take 1 tablet (150 mg total) by mouth at bedtime.  Dispense: 30 tablet; Refill: 2 Continue- busPIRone (BUSPAR) 15  MG tablet; Take 1 tablet (15 mg total) by mouth 3 (three) times daily.  Dispense: 90 tablet; Refill: 2 2. PTSD (post-traumatic stress disorder)  Continue- QUEtiapine (SEROQUEL) 150 MG tablet; Take 1 tablet (150 mg total) by mouth at bedtime.  Dispense: 30 tablet; Refill: 2  3. GAD (generalized anxiety disorder)  Continue- hydrOXYzine (ATARAX/VISTARIL) 10 MG tablet;  Take 1 tablet (10 mg total) by mouth 3 (three) times daily as needed.  Dispense: 90 tablet; Refill: 2 Continue- busPIRone (BUSPAR) 15 MG tablet; Take 1 tablet (15 mg total) by mouth 3 (three) times daily.  Dispense: 90 tablet; Refill: 2 Increased- sertraline (ZOLOFT) 100 MG tablet; Take 1.5 tablets (150 mg total) by mouth daily.  Dispense: 45 tablet; Refill: 2  Follow up in 3 months Follow up with therapy   Shanna Cisco, NP 11/24/2020, 11:48 AM

## 2020-11-24 NOTE — Telephone Encounter (Signed)
Patient called requesting a refill for amLODipine (NORVASC) 10 MG tablet losartan (COZAAR) 100 MG tablet. If you can please send those Rx to [facility] pharmacy.   Also, patient wanted you to know that the diclofenac (VOLTAREN) 75 MG EC tablet you prescribed for her is not working for her pain. Patient wants to know if you can please send something else to help with her pain.

## 2020-11-26 ENCOUNTER — Ambulatory Visit (INDEPENDENT_AMBULATORY_CARE_PROVIDER_SITE_OTHER): Payer: Self-pay | Admitting: Internal Medicine

## 2020-11-26 ENCOUNTER — Other Ambulatory Visit: Payer: Self-pay

## 2020-11-26 ENCOUNTER — Encounter: Payer: Self-pay | Admitting: Internal Medicine

## 2020-11-26 VITALS — BP 130/80 | HR 59 | Ht 72.0 in | Wt 213.2 lb

## 2020-11-26 DIAGNOSIS — I10 Essential (primary) hypertension: Secondary | ICD-10-CM

## 2020-11-26 DIAGNOSIS — Z01810 Encounter for preprocedural cardiovascular examination: Secondary | ICD-10-CM | POA: Insufficient documentation

## 2020-11-26 DIAGNOSIS — E785 Hyperlipidemia, unspecified: Secondary | ICD-10-CM

## 2020-11-26 NOTE — Patient Instructions (Signed)
Medication Instructions:  Your physician recommends that you continue on your current medications as directed. Please refer to the Current Medication list given to you today.  *If you need a refill on your cardiac medications before your next appointment, please call your pharmacy*   Lab Work: NONE If you have labs (blood work) drawn today and your tests are completely normal, you will receive your results only by: MyChart Message (if you have MyChart) OR A paper copy in the mail If you have any lab test that is abnormal or we need to change your treatment, we will call you to review the results.   Testing/Procedures: NONE   Follow-Up: At [facility], you and your health needs are our priority.  As part of our continuing mission to provide you with exceptional heart care, we have created designated Provider Care Teams.  These Care Teams include your primary Cardiologist (physician) and Advanced Practice Providers (APPs -  Physician Assistants and Nurse Practitioners) who all work together to provide you with the care you need, when you need it.  We recommend signing up for the patient portal called "MyChart".  Sign up information is provided on this After Visit Summary.  MyChart is used to connect with patients for Virtual Visits (Telemedicine).  Patients are able to view lab/test results, encounter notes, upcoming appointments, etc.  Non-urgent messages can be sent to your provider as well.   To learn more about what you can do with MyChart, go to [email or web address].    Your next appointment:   12 month(s)  The format for your next appointment:   In Person  Provider:   You may see Mahesh A Chandrasekhar, MD or one of the following Advanced Practice Providers on your designated Care Team:   Dayna Dunn, PA-C Michele Lenze, PA-C      

## 2020-11-30 NOTE — Telephone Encounter (Signed)
Patient stated that the is not working for her pains at all. She would like to know if you can give her something else for pain because started to get difficult to function with the pain.  Patient stated that her MRI was scheduled for next week at [facility]

## 2020-12-04 NOTE — Telephone Encounter (Signed)
I do not have any other good alternatives for pain management. Toradol is not good for regular use. Has she established yet with PT? Doing the stretches we discussed? Also , need to see what the MR shows.

## 2020-12-07 NOTE — Telephone Encounter (Signed)
Spoke with patient ans she stated that she already talked to PT, she has been doing the stretches you recommended. She also stated that she will wait to see what are the results of the MRI and will decide if she will continue coming to [facility] since she recently acquired insurance and hasn't verify that we are in the network. Patient will keep [place] updated.

## 2020-12-15 NOTE — Telephone Encounter (Signed)
Dr. Sherron Monday with patient and notified her that she will be moving to another practice, [facility].

## 2020-12-15 NOTE — Telephone Encounter (Signed)
I have not seen the results of the MR--does she know exactly which health system the [facility] radiology office is with?  I don't think they are on Epic if the MR of her hip was performed.

## 2020-12-16 ENCOUNTER — Telehealth: Payer: Self-pay

## 2020-12-16 DIAGNOSIS — Z006 Encounter for examination for normal comparison and control in clinical research program: Secondary | ICD-10-CM

## 2020-12-16 NOTE — Telephone Encounter (Signed)
Called patient for 90 day Identify phone call no answer, I left a voicemail stating the intent of the phone call and our call back number to be reached in our department. 

## 2020-12-18 ENCOUNTER — Other Ambulatory Visit: Payer: Self-pay | Admitting: Family Medicine

## 2020-12-18 ENCOUNTER — Encounter: Payer: Self-pay | Admitting: Family Medicine

## 2020-12-18 MED ORDER — DIAZEPAM 5 MG PO TABS: ORAL_TABLET | ORAL | 0 refills | Status: DC

## 2020-12-19 ENCOUNTER — Ambulatory Visit (HOSPITAL_BASED_OUTPATIENT_CLINIC_OR_DEPARTMENT_OTHER): Payer: 59

## 2020-12-21 ENCOUNTER — Other Ambulatory Visit: Payer: Self-pay

## 2020-12-21 ENCOUNTER — Other Ambulatory Visit (HOSPITAL_BASED_OUTPATIENT_CLINIC_OR_DEPARTMENT_OTHER): Payer: Self-pay | Admitting: Internal Medicine

## 2020-12-21 ENCOUNTER — Ambulatory Visit (INDEPENDENT_AMBULATORY_CARE_PROVIDER_SITE_OTHER): Payer: No Payment, Other | Admitting: Clinical

## 2020-12-21 DIAGNOSIS — Z1231 Encounter for screening mammogram for malignant neoplasm of breast: Secondary | ICD-10-CM

## 2020-12-21 DIAGNOSIS — F331 Major depressive disorder, recurrent, moderate: Secondary | ICD-10-CM | POA: Diagnosis not present

## 2020-12-21 NOTE — Progress Notes (Signed)
   THERAPIST PROGRESS NOTE Virtual Visit via Video Note  I connected with [name] on 12/21/20 at 11:00 AM EDT by a video enabled telemedicine application and verified that I am speaking with the correct person using two identifiers.  Location: Patient: home Provider: office   I discussed the limitations of evaluation and management by telemedicine and the availability of in person appointments. The patient expressed understanding and agreed to proceed.   Follow Up Instructions: I discussed the assessment and treatment plan with the patient. The patient was provided an opportunity to ask questions and all were answered. The patient agreed with the plan and demonstrated an understanding of the instructions.   The patient was advised to call back or seek an in-person evaluation if the symptoms worsen or if the condition fails to improve as anticipated.   Session Time: 45 minutes  Participation Level: Active  Behavioral Response: CasualAlertIrritable  Type of Therapy: Individual Therapy  Treatment Goals addressed: Coping  Interventions: CBT  Summary:  Crystal Winters is a 50 y.o. female who presents for the scheduled session oriented times five, appropriately dressed, and friendly. Client denied hallucinations and delusions. Client reported on today she has been feeling irritable and recently under the weather. Client reported she has felt disappointed because her children did not celebrate or send her anything for mothers day. Client reported feeling her children take her for granted and don't value her. Client reported she has made sure to be present for her children and grandchildren because she wasn't raised by her parents. Client reported she has also continued to have stress in her relationship of three years. Client reported her fiance continues to accuse her of cheating when she is not. Client reported he makes reference to her past and doesn't give her the security in the  relationship that she ideally wants to have. Client reported feeling frustrated by his actions.   Suicidal/Homicidal: Nowithout intent/plan  Therapist Response:  Therapist began the session asking how she has been doing since last seen. Therapist offered positive support and active listening. Therapist used CBT to ask probing questions about the origin of her depressive and irritable emotions and thoughts. Therapist used CBT to discuss evaluating the pros and cons of her stressors to help make decisions. Client was scheduled for next appointment.    Plan: Return again in 5 weeks.  Diagnosis: Moderate episode of recurrent major depressive disorder  Crystal Rhymes Sabien Umland, LCSW 12/21/2020

## 2020-12-22 ENCOUNTER — Encounter (HOSPITAL_BASED_OUTPATIENT_CLINIC_OR_DEPARTMENT_OTHER): Payer: Self-pay

## 2020-12-22 ENCOUNTER — Ambulatory Visit (HOSPITAL_BASED_OUTPATIENT_CLINIC_OR_DEPARTMENT_OTHER)
Admission: RE | Admit: 2020-12-22 | Discharge: 2020-12-22 | Disposition: A | Discharge status: Home / Self Care | Payer: 59 | Source: Ambulatory Visit | Attending: Internal Medicine | Admitting: Internal Medicine

## 2020-12-22 ENCOUNTER — Other Ambulatory Visit: Payer: Self-pay

## 2020-12-22 DIAGNOSIS — Z1231 Encounter for screening mammogram for malignant neoplasm of breast: Secondary | ICD-10-CM | POA: Insufficient documentation

## 2020-12-26 ENCOUNTER — Ambulatory Visit (HOSPITAL_BASED_OUTPATIENT_CLINIC_OR_DEPARTMENT_OTHER): Payer: 59

## 2020-12-30 ENCOUNTER — Encounter: Payer: Medicaid Other | Admitting: Internal Medicine

## 2021-01-02 ENCOUNTER — Ambulatory Visit (HOSPITAL_BASED_OUTPATIENT_CLINIC_OR_DEPARTMENT_OTHER)
Admission: RE | Admit: 2021-01-02 | Discharge: 2021-01-02 | Disposition: A | Discharge status: Home / Self Care | Payer: 59 | Source: Ambulatory Visit | Attending: Family Medicine | Admitting: Family Medicine

## 2021-01-02 ENCOUNTER — Other Ambulatory Visit: Payer: Self-pay

## 2021-01-02 DIAGNOSIS — R2242 Localized swelling, mass and lump, left lower limb: Secondary | ICD-10-CM | POA: Diagnosis present

## 2021-01-02 DIAGNOSIS — M25552 Pain in left hip: Secondary | ICD-10-CM | POA: Diagnosis present

## 2021-01-02 MED ORDER — GADOBUTROL 1 MMOL/ML IV SOLN
9.5000 mL | Freq: Once | INTRAVENOUS | Status: AC | PRN
  Administered 2021-01-02: 9.5 mL via INTRAVENOUS

## 2021-01-05 ENCOUNTER — Telehealth: Payer: Self-pay | Admitting: Family Medicine

## 2021-01-05 NOTE — Telephone Encounter (Signed)
There is no worrisome mass seen on MRI scan of the pelvis.  There is a small inguinal hernia on the right which contains some fat, potentially this could be what you are noticing.  If not painful, no surgery indicated at this point.  There is some mild tendinitis in the hip.  If the side of the hip is hurting, could consider physical therapy for this.

## 2021-01-11 ENCOUNTER — Telehealth: Payer: Self-pay

## 2021-01-11 DIAGNOSIS — Z006 Encounter for examination for normal comparison and control in clinical research program: Secondary | ICD-10-CM

## 2021-01-11 NOTE — Telephone Encounter (Signed)
I have attempted without success to contact this patient by phone for her Identify 90 day follow up phone call. I left a message for patient to return my phone call with my name and callback number. An e-mail was also sent to patient.  

## 2021-01-18 ENCOUNTER — Telehealth: Payer: Self-pay

## 2021-01-18 DIAGNOSIS — Z006 Encounter for examination for normal comparison and control in clinical research program: Secondary | ICD-10-CM

## 2021-01-18 NOTE — Telephone Encounter (Signed)
I have attempted without success to contact this patient by phone for her Identify 90 day follow up phone call. I left a message for patient to return my phone call with my name and callback number. An e-mail was also sent to patient.  

## 2021-01-26 ENCOUNTER — Encounter: Payer: Self-pay | Admitting: *Deleted

## 2021-01-26 DIAGNOSIS — Z006 Encounter for examination for normal comparison and control in clinical research program: Secondary | ICD-10-CM

## 2021-01-26 NOTE — Research (Signed)
CADFEM 90 DAY IDENTIFY CALL  Patient is doing well since her cta no further problems. Will call her in January for final phone call.        [place] Hyden Soley,research assistant  01/26/2021  10:45 a.m.

## 2021-02-16 ENCOUNTER — Other Ambulatory Visit: Payer: Self-pay

## 2021-02-16 ENCOUNTER — Ambulatory Visit (INDEPENDENT_AMBULATORY_CARE_PROVIDER_SITE_OTHER): Payer: 59 | Admitting: Clinical

## 2021-02-16 DIAGNOSIS — F411 Generalized anxiety disorder: Secondary | ICD-10-CM
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
## 2021-02-17 ENCOUNTER — Ambulatory Visit (HOSPITAL_COMMUNITY): Payer: No Payment, Other | Admitting: Clinical

## 2021-02-17 ENCOUNTER — Other Ambulatory Visit (HOSPITAL_COMMUNITY): Payer: Self-pay | Admitting: Psychiatry

## 2021-02-17 ENCOUNTER — Telehealth (HOSPITAL_COMMUNITY): Payer: Self-pay | Admitting: *Deleted

## 2021-02-17 DIAGNOSIS — F4310 Post-traumatic stress disorder, unspecified: Secondary | ICD-10-CM

## 2021-02-17 DIAGNOSIS — F331 Major depressive disorder, recurrent, moderate: Secondary | ICD-10-CM

## 2021-02-17 MED ORDER — QUETIAPINE FUMARATE 100 MG PO TABS
150.0000 mg | ORAL_TABLET | Freq: Every day | ORAL | 2 refills | Status: DC
Start: 2021-02-17 — End: 2021-02-23

## 2021-02-17 NOTE — Telephone Encounter (Signed)
Rx REFILL REQUEST QUEtiapine (SEROQUEL) 100 MG tablet

## 2021-02-17 NOTE — Telephone Encounter (Signed)
Medication refilled and sent to preferred pharmacy

## 2021-02-23 ENCOUNTER — Other Ambulatory Visit: Payer: Self-pay

## 2021-02-23 ENCOUNTER — Encounter (HOSPITAL_COMMUNITY): Payer: Self-pay | Admitting: Psychiatry

## 2021-02-23 ENCOUNTER — Telehealth (INDEPENDENT_AMBULATORY_CARE_PROVIDER_SITE_OTHER): Payer: 59 | Admitting: Psychiatry

## 2021-02-23 DIAGNOSIS — F411 Generalized anxiety disorder: Secondary | ICD-10-CM | POA: Diagnosis not present

## 2021-02-23 DIAGNOSIS — F4310 Post-traumatic stress disorder, unspecified: Secondary | ICD-10-CM | POA: Diagnosis not present

## 2021-02-23 DIAGNOSIS — F331 Major depressive disorder, recurrent, moderate: Secondary | ICD-10-CM

## 2021-02-23 MED ORDER — BUSPIRONE HCL 15 MG PO TABS
15.0000 mg | ORAL_TABLET | Freq: Three times a day (TID) | ORAL | 2 refills | Status: DC
Start: 2021-02-23 — End: 2021-05-18

## 2021-02-23 MED ORDER — GABAPENTIN 300 MG PO CAPS
300.0000 mg | ORAL_CAPSULE | Freq: Three times a day (TID) | ORAL | 2 refills | Status: DC
Start: 2021-02-23 — End: 2021-05-18

## 2021-02-23 MED ORDER — SERTRALINE HCL 100 MG PO TABS: 150.0000 mg | ORAL_TABLET | Freq: Every day | ORAL | 0 refills | Status: DC

## 2021-02-23 MED ORDER — HYDROXYZINE HCL 10 MG PO TABS: 10.0000 mg | ORAL_TABLET | Freq: Three times a day (TID) | ORAL | 2 refills | Status: DC | PRN

## 2021-02-23 MED ORDER — QUETIAPINE FUMARATE 100 MG PO TABS: 150.0000 mg | ORAL_TABLET | Freq: Every day | ORAL | 2 refills | Status: DC

## 2021-02-23 NOTE — Progress Notes (Signed)
BH MD/PA/NP OP Progress Note Virtual Visit via Video Note  I connected with Crystal Winters on 02/23/21 at 10:30 AM EDT by a video enabled telemedicine application and verified that I am speaking with the correct person using two identifiers.  Location: Patient: Home Provider: Clinic   I discussed the limitations of evaluation and management by telemedicine and the availability of in person appointments. The patient expressed understanding and agreed to proceed.  I provided 30 minutes of non-face-to-face time during this encounter.        02/23/2021 10:49 AM Crystal Winters  MRN:  [id number]  Chief Complaint: "I have been more anxious"  HPI: 50 year old female seen today for follow-up psychiatric evaluation.  She has a psychiatric history of PTSD, anxiety, depression, and insomnia.  She is currently managed on Zoloft 150 mg daily, Seroquel 150 mg nightly, hydroxyzine 10 mg three times daily as needed, and BuSpar 15 mg 3 times daily.  She notes her medications are somewhat effective in managing her psychiatric conditions.   Today patient was pleasant, cooperative, well-groomed, maintained eye contact, and engaged in conversation.  She informed Clinical research associate that she has been more anxious.  She notes that she continues to be concerned about her health, finances, her children, and her mother.  She informed Clinical research associate that that recently she went to receive an MRI on her hip and was given Valium and notes that that helped manage her anxiety.  She asked provided Valium was an option.  Provider informed patient that Valium at this time was not an option.  She endorsed understanding.  Provider conducted a GAD-7 and patient scored 18, at her last visit she scored a 17.  Provider also conducted a PHQ-9 and patient scored a 17, at her last visit she scored a 12.  Today she denies SI/HI/VAH, mania, or paranoia.  She notes her sleep is improved since increasing Seroquel noting that she sleeps 6 to 7 hours nightly.   Patient informed writer that since her last visit she has gained 10 pounds and has not increased appetite.  Provider informed patient that Seroquel at times could increase her appetite and cause weight gain.  She endorsed understanding.     Patient notes that she continues to be in pain most days.  She notes that her left hip hurts and her right arm also hurts.  She notes that she recently found out that she has tendinitis in her hip and arthritis in her arm.  She notes that her provider sent her to PT which she reports has been somewhat effective however not enough.  She notes that throughout the night she tosses and turns because she is unable to get comfortable due to the pain.  Today she is agreeable to starting gabapentin 300 mg 3 times daily to help manage anxiety and pain.  Her antidepressant not adjusted at this time however it may be adjusted at her next visit.  She will continue all other medications as prescribed.  No other concerns noted at this time.     Visit Diagnosis:    ICD-10-CM   1. GAD (generalized anxiety disorder)  F41.1 busPIRone (BUSPAR) 15 MG tablet    hydrOXYzine (ATARAX/VISTARIL) 10 MG tablet    sertraline (ZOLOFT) 100 MG tablet    2. Moderate episode of recurrent major depressive disorder (HCC)  F33.1 busPIRone (BUSPAR) 15 MG tablet    QUEtiapine (SEROQUEL) 100 MG tablet    sertraline (ZOLOFT) 100 MG tablet    3. PTSD (post-traumatic stress disorder)  F43.10 QUEtiapine (SEROQUEL) 100 MG tablet      Past Psychiatric History: PTSD, GAD, Depression, insomnia  Past Medical History:  Past Medical History:  Diagnosis Date   Acute GI bleeding 10/02/2018   Anemia    Whole life   Anxiety 02/19/2019   ARF (acute renal failure) (HCC) 10/02/2018   Back pain    L/S spine with left radiculopathy   Dental decay 12/27/2019   Diverticulosis of colon with hemorrhage 09/24/2018   Elevated blood pressure reading with diagnosis of hypertension 02/19/2019   Exacerbation of  systemic lupus (HCC) 12/25/2018   Greater trochanteric bursitis, left 11/09/2020   Hemorrhage of colon following colonoscopy 11/07/2018   Hypertension 1997   Hypocalcemia    Lupus (HCC) 2012   Joint complaints, fatigue, soft tissue swelling, maybe kidney involvement.     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   Vertigo     Past Surgical History:  Procedure Laterality Date   BREAST REDUCTION SURGERY  2007   CHOLECYSTECTOMY  1997   COLONOSCOPY WITH PROPOFOL N/A 09/27/2018   Procedure: COLONOSCOPY WITH PROPOFOL;  Surgeon: Graylin Shiver, MD;  Location: [facility];  Service: Endoscopy;  Laterality: N/A;   FLEXIBLE SIGMOIDOSCOPY N/A 10/03/2018   Procedure: FLEXIBLE SIGMOIDOSCOPY;  Surgeon: Kathi Der, MD;  Location: [facility];  Service: Gastroenterology;  Laterality: N/A;   REDUCTION MAMMAPLASTY Bilateral 2007   TOE SURGERY Right    Bunionectomy   TUBAL LIGATION  1992   VAGINAL HYSTERECTOMY  1999    Family Psychiatric History: Mother alcohol use   Family History:  Family History  Problem Relation Age of Onset   Hepatitis C Father    Kidney disease Father        Not sure if due to SLE or if he has SLE   Lung disease Father        has had 1/2 lung removed for unknown reason   Thyroid disease Father        Sounds more like hyperthyroidism--can't gain weight.   Rectal cancer Other    Hypertension Other    Diabetes Other    Breast cancer Other    Colon cancer Other    Prostate cancer Other    Stroke Other    CAD Other    Coronary artery disease Other    Lupus Sister    Asthma Son    Kidney disease Paternal Aunt        Likely due to SLE   Lupus Paternal Aunt    Kidney disease  Paternal Grandmother        kidney disease--not clear if had SLE   Breast cancer Maternal Aunt    Breast cancer Maternal Aunt     Social History:  Social History   Socioeconomic History   Marital status: Media planner    Spouse name: Divorced   Number of children: 3   Years of education: Not on file   Highest education level: Bachelor's degree (e.g., BA, AB, BS)  Occupational History   Occupation: Mental Health Professional  Tobacco Use   Smoking status: Never   Smokeless tobacco: Never  Vaping Use   Vaping Use: Never used  Substance and Sexual Activity   Alcohol use: Yes  Comment: Wine occasionally   Drug use: Never   Sexual activity: Yes    Birth control/protection: Surgical  Other Topics Concern   Not on file  Social History Narrative   Working on Masters in [employer] at home with fiance.   2 dogs, Criss Alvine and Duke   Social Determinants of Health   Financial Resource Strain: Not on file  Food Insecurity: Not on file  Transportation Needs: Not on file  Physical Activity: Not on file  Stress: Not on file  Social Connections: Not on file    Allergies:  Allergies  Allergen Reactions   Prochlorperazine Other (See Comments), Swelling and Palpitations    Muscle stiffness Muscle stiffness    Metabolic Disorder Labs: Lab Results  Component Value Date   HGBA1C 4.5 (L) 05/01/2020   No results found for: PROLACTIN Lab Results  Component Value Date   CHOL 205 (H) 06/30/2020   TRIG 83 06/30/2020   HDL 64 06/30/2020   CHOLHDL 3.1 10/12/2018   LDLCALC 126 (H) 06/30/2020   LDLCALC 140 (H) 12/27/2019   Lab Results  Component Value Date   TSH 1.360 12/27/2019   TSH 1.190 10/12/2018    Therapeutic Level Labs: No results found for: LITHIUM No results found for: VALPROATE No components found for:  CBMZ  Current Medications: Current Outpatient Medications  Medication Sig Dispense Refill   gabapentin (NEURONTIN) 300 MG capsule Take 1  capsule (300 mg total) by mouth 3 (three) times daily. 90 capsule 2   amLODipine (NORVASC) 10 MG tablet Take 1 tablet (10 mg total) by mouth daily. 30 tablet 11   aspirin EC 81 MG tablet Take 1 tablet (81 mg total) by mouth daily. Swallow whole. 30 tablet 11   atorvastatin (LIPITOR) 20 MG tablet 1 tab by mouth with evening meal daily 30 tablet 11   busPIRone (BUSPAR) 15 MG tablet Take 1 tablet (15 mg total) by mouth 3 (three) times daily. 90 tablet 2   chlorthalidone (HYGROTON) 25 MG tablet Take 1 tablet (25 mg total) by mouth daily. 90 tablet 3   dicyclomine (BENTYL) 20 MG tablet 1 tab by mouth every 6 hours as needed for bloating 30 tablet 11   hydroxychloroquine (PLAQUENIL) 200 MG tablet Take 1 tablet (200 mg total) by mouth 2 (two) times daily. 60 tablet 11   hydrOXYzine (ATARAX/VISTARIL) 10 MG tablet Take 1 tablet (10 mg total) by mouth 3 (three) times daily as needed. 90 tablet 2   isosorbide mononitrate (IMDUR) 30 MG 24 hr tablet Take 1 tablet (30 mg total) by mouth daily. 90 tablet 3   loratadine (CLARITIN) 10 MG tablet Take 1 tablet (10 mg total) by mouth daily. 30 tablet 11   losartan (COZAAR) 100 MG tablet Take 1 tablet (100 mg total) by mouth daily. 30 tablet 11   meclizine (ANTIVERT) 25 MG tablet Take 1 tablet (25 mg total) by mouth 3 (three) times daily as needed for dizziness. 30 tablet 3   metoprolol tartrate (LOPRESSOR) 50 MG tablet 1 1/2 tabs by mouth twice daily 90 tablet 11   nitroGLYCERIN (NITROSTAT) 0.4 MG SL tablet Place 1 tablet (0.4 mg total) under the tongue every 5 (five) minutes as needed for chest pain. 25 tablet 1   potassium chloride SA (KLOR-CON M20) 20 MEQ tablet Take 2 tablets (40 mEq total) by mouth daily. 90 tablet 3   QUEtiapine (SEROQUEL) 100 MG tablet Take 1.5 tablets (150 mg total) by mouth at bedtime. 45 tablet  2   sertraline (ZOLOFT) 100 MG tablet Take 1.5 tablets (150 mg total) by mouth daily. 45 tablet 0   No current facility-administered medications for  this visit.     Musculoskeletal: Strength & Muscle Tone:  Unable to assess due to telehealth visit Gait & Station:  Unable to assess due to telehealth visit Patient leans: N/A  Psychiatric Specialty Exam: Review of Systems  There were no vitals taken for this visit.There is no height or weight on file to calculate BMI.  General Appearance: Well Groomed  Eye Contact:  Good  Speech:  Clear and Coherent and Normal Rate  Volume:  Normal  Mood:  Anxious and Depressed  Affect:  Appropriate and Congruent  Thought Process:  Coherent, Goal Directed and Linear  Orientation:  Full (Time, Place, and Person)  Thought Content: WDL and Logical   Suicidal Thoughts:  No  Homicidal Thoughts:  No  Memory:  Immediate;   Good Recent;   Good Remote;   Good  Judgement:  Good  Insight:  Good  Psychomotor Activity:  Normal  Concentration:  Concentration: Good and Attention Span: Good  Recall:  Good  Fund of Knowledge: Good  Language: Good  Akathisia:  No  Handed:  Right  AIMS (if indicated):Not done  Assets:  Communication Skills Desire for Improvement Financial Resources/Insurance Housing Intimacy Social Support  ADL's:  Intact  Cognition: WNL  Sleep:  Good   Screenings: GAD-7    Flowsheet Row Video Visit from 02/23/2021 in [facility] Video Visit from 11/24/2020 in [facility] Video Visit from 08/26/2020 in [facility] Video Visit from 05/26/2020 in [facility] Office Visit from 09/25/2019 in [facility]  Total GAD-7 Score 18 17 18 18 18       PHQ2-9    Flowsheet Row Video Visit from 02/23/2021 in [facility] Video Visit from 11/24/2020 in [facility] Video Visit from 08/26/2020 in [facility] Video Visit from 05/26/2020 in [facility] Counselor  from 02/12/2020 in [facility]  PHQ-2 Total Score 4 2 4 5 2   PHQ-9 Total Score 17 12 15 21 10       Flowsheet Row Video [facility] 11[facility]24/2020 in [facility]  C-SSRS RISK CATEGORY Error: Question 1 not populated No Risk        Assessment and Plan: Patient notes that her anxiety and depression are exacerbated by life stressors and pain.  Today she is agreeable to starting gabapentin 300 mg 3 times daily to help manage anxiety and pain.  Her antidepressant not adjusted at this time however it may be at the next visit.  She will continue all other medications as prescribed. 1. Moderate episode of recurrent major depressive disorder (HCC)  Continue- sertraline (ZOLOFT) 100 MG tablet; Take 1.5 tablets (150 mg total) by mouth daily.  Dispense: 45 tablet; Refill: 2 Continue- QUEtiapine (SEROQUEL) 150 MG tablet; Take 1 tablet (150 mg total) by mouth at bedtime.  Dispense: 30 tablet; Refill: 2 Continue- busPIRone (BUSPAR) 15 MG tablet; Take 1 tablet (15 mg total) by mouth 3 (three) times daily.  Dispense: 90 tablet; Refill: 2 2. PTSD (post-traumatic stress disorder)  Continue- QUEtiapine (SEROQUEL) 150 MG tablet; Take 1 tablet (150 mg total) by mouth at bedtime.  Dispense: 30 tablet; Refill: 2  3. GAD (generalized anxiety disorder)  Continue- hydrOXYzine (ATARAX/VISTARIL) 10 MG tablet; Take 1 tablet (10 mg total) by mouth 3 (three) times daily as needed.  Dispense: 90 tablet; Refill: 2 Continue- busPIRone (BUSPAR) 15 MG tablet; Take 1 tablet (15 mg total) by mouth 3 (three) times daily.  Dispense: 90 tablet; Refill: 2 Continue- sertraline (ZOLOFT) 100 MG tablet; Take 1.5 tablets (150 mg total) by mouth daily.  Dispense: 45 tablet; Refill: 2 Start- gabapentin (NEURONTIN) 300 MG capsule; Take 1 capsule (300 mg total) by mouth 3 (three) times daily.  Dispense: 90 capsule; Refill:  2    Follow up in 3 months Follow up with therapy   Shanna CiscoBrittney E Kemarion Abbey, NP 02/23/2021, 10:49 AM

## 2021-03-05 ENCOUNTER — Ambulatory Visit (INDEPENDENT_AMBULATORY_CARE_PROVIDER_SITE_OTHER): Payer: 59 | Admitting: Clinical

## 2021-03-05 ENCOUNTER — Other Ambulatory Visit: Payer: Self-pay

## 2021-03-05 DIAGNOSIS — F331 Major depressive disorder, recurrent, moderate: Secondary | ICD-10-CM | POA: Diagnosis not present
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
## 2021-03-18 ENCOUNTER — Ambulatory Visit (INDEPENDENT_AMBULATORY_CARE_PROVIDER_SITE_OTHER): Payer: 59 | Admitting: Clinical

## 2021-03-18 DIAGNOSIS — F331 Major depressive disorder, recurrent, moderate: Secondary | ICD-10-CM
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
## 2021-03-22 ENCOUNTER — Other Ambulatory Visit: Payer: Self-pay | Admitting: Internal Medicine

## 2021-03-26 ENCOUNTER — Ambulatory Visit (HOSPITAL_COMMUNITY): Payer: Self-pay | Admitting: Clinical

## 2021-04-06 ENCOUNTER — Ambulatory Visit (INDEPENDENT_AMBULATORY_CARE_PROVIDER_SITE_OTHER): Payer: 59 | Admitting: Clinical

## 2021-04-06 ENCOUNTER — Other Ambulatory Visit: Payer: Self-pay

## 2021-04-06 DIAGNOSIS — F331 Major depressive disorder, recurrent, moderate: Secondary | ICD-10-CM | POA: Diagnosis not present
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
## 2021-04-08 ENCOUNTER — Emergency Department (HOSPITAL_BASED_OUTPATIENT_CLINIC_OR_DEPARTMENT_OTHER)
Admission: EM | Admit: 2021-04-08 | Discharge: 2021-04-08 | Disposition: A | Discharge status: Home / Self Care | Payer: 59 | Attending: Emergency Medicine | Admitting: Emergency Medicine

## 2021-04-08 ENCOUNTER — Emergency Department (HOSPITAL_BASED_OUTPATIENT_CLINIC_OR_DEPARTMENT_OTHER): Payer: 59

## 2021-04-08 ENCOUNTER — Encounter (HOSPITAL_BASED_OUTPATIENT_CLINIC_OR_DEPARTMENT_OTHER): Payer: Self-pay | Admitting: *Deleted

## 2021-04-08 ENCOUNTER — Other Ambulatory Visit: Payer: Self-pay

## 2021-04-08 DIAGNOSIS — Z7982 Long term (current) use of aspirin: Secondary | ICD-10-CM | POA: Insufficient documentation

## 2021-04-08 DIAGNOSIS — I10 Essential (primary) hypertension: Secondary | ICD-10-CM | POA: Insufficient documentation

## 2021-04-08 DIAGNOSIS — E876 Hypokalemia: Secondary | ICD-10-CM | POA: Diagnosis not present

## 2021-04-08 DIAGNOSIS — K219 Gastro-esophageal reflux disease without esophagitis: Secondary | ICD-10-CM | POA: Diagnosis not present

## 2021-04-08 DIAGNOSIS — R079 Chest pain, unspecified: Secondary | ICD-10-CM | POA: Diagnosis present

## 2021-04-08 DIAGNOSIS — Z79899 Other long term (current) drug therapy: Secondary | ICD-10-CM | POA: Diagnosis not present

## 2021-04-08 DIAGNOSIS — R0789 Other chest pain: Secondary | ICD-10-CM

## 2021-04-08 LAB — CBC WITH DIFFERENTIAL/PLATELET
Abs Immature Granulocytes: 0.02 10*3/uL (ref 0.00–0.07)
Basophils Absolute: 0 10*3/uL (ref 0.0–0.1)
Basophils Relative: 0 %
Eosinophils Absolute: 0.2 10*3/uL (ref 0.0–0.5)
Eosinophils Relative: 3 %
HCT: 39.6 % (ref 36.0–46.0)
Hemoglobin: 13.2 g/dL (ref 12.0–15.0)
Immature Granulocytes: 0 %
Lymphocytes Relative: 27 %
Lymphs Abs: 1.8 10*3/uL (ref 0.7–4.0)
MCH: 28.4 pg (ref 26.0–34.0)
MCHC: 33.3 g/dL (ref 30.0–36.0)
MCV: 85.2 fL (ref 80.0–100.0)
Monocytes Absolute: 0.4 10*3/uL (ref 0.1–1.0)
Monocytes Relative: 6 %
Neutro Abs: 4.3 10*3/uL (ref 1.7–7.7)
Neutrophils Relative %: 64 %
Platelets: 203 10*3/uL (ref 150–400)
RBC: 4.65 MIL/uL (ref 3.87–5.11)
RDW: 12.2 % (ref 11.5–15.5)
WBC: 6.7 10*3/uL (ref 4.0–10.5)
nRBC: 0 % (ref 0.0–0.2)

## 2021-04-08 LAB — COMPREHENSIVE METABOLIC PANEL
ALT: 14 U/L (ref 0–44)
AST: 18 U/L (ref 15–41)
Albumin: 4 g/dL (ref 3.5–5.0)
Alkaline Phosphatase: 95 U/L (ref 38–126)
Anion gap: 8 (ref 5–15)
BUN: 11 mg/dL (ref 6–20)
CO2: 25 mmol/L (ref 22–32)
Calcium: 8.5 mg/dL — ABNORMAL LOW (ref 8.9–10.3)
Chloride: 105 mmol/L (ref 98–111)
Creatinine, Ser: 0.86 mg/dL (ref 0.44–1.00)
GFR, Estimated: >60 mL/min (ref >60)
Glucose, Bld: 110 mg/dL — ABNORMAL HIGH (ref 70–99)
Potassium: 3.1 mmol/L — ABNORMAL LOW (ref 3.5–5.1)
Sodium: 138 mmol/L (ref 135–145)
Total Bilirubin: 0.2 mg/dL — ABNORMAL LOW (ref 0.3–1.2)
Total Protein: 7.2 g/dL (ref 6.5–8.1)

## 2021-04-08 LAB — TROPONIN I (HIGH SENSITIVITY)
Troponin I (High Sensitivity): 4 ng/L (ref <18)
Troponin I (High Sensitivity): 4 ng/L (ref <18)

## 2021-04-08 LAB — LIPASE, BLOOD: Lipase: 29 U/L (ref 11–51)

## 2021-04-08 LAB — D-DIMER, QUANTITATIVE: D-Dimer, Quant: 0.31 ug/mL-FEU (ref 0.00–0.50)

## 2021-04-08 MED ORDER — ACETAMINOPHEN 325 MG PO TABS
650.0000 mg | ORAL_TABLET | Freq: Once | ORAL | Status: AC
  Administered 2021-04-08: 650 mg via ORAL
  Filled 2021-04-08: qty 2

## 2021-04-08 MED ORDER — FAMOTIDINE 20 MG PO TABS: 20.0000 mg | ORAL_TABLET | Freq: Two times a day (BID) | ORAL | 0 refills | Status: AC

## 2021-04-08 MED ORDER — POTASSIUM CHLORIDE CRYS ER 20 MEQ PO TBCR
40.0000 meq | EXTENDED_RELEASE_TABLET | Freq: Once | ORAL | Status: AC
  Administered 2021-04-08: 40 meq via ORAL
  Filled 2021-04-08: qty 2

## 2021-04-08 MED ORDER — METHOCARBAMOL 500 MG PO TABS
500.0000 mg | ORAL_TABLET | Freq: Once | ORAL | Status: AC
  Administered 2021-04-08: 500 mg via ORAL
  Filled 2021-04-08: qty 1

## 2021-04-08 MED ORDER — POTASSIUM CHLORIDE ER 10 MEQ PO TBCR: 40.0000 meq | EXTENDED_RELEASE_TABLET | Freq: Every day | ORAL | 0 refills | Status: AC

## 2021-04-08 MED ORDER — FAMOTIDINE IN NACL 20-0.9 MG/50ML-% IV SOLN
20.0000 mg | Freq: Once | INTRAVENOUS | Status: AC
  Administered 2021-04-08: 20 mg via INTRAVENOUS
  Filled 2021-04-08: qty 50

## 2021-04-08 NOTE — ED Provider Notes (Signed)
[facility] EMERGENCY DEPARTMENT Provider Note   CSN: [id number] Arrival date & time: 04/08/21  1456     History Chief Complaint  Patient presents with   Chest Pain    Crystal Winters is a 50 y.o. female with a past medical history of lupus, hypertension, hyperlipidemia, anemia presenting to the ED with a chief complaint of chest pain.  Since she woke up on 04/07/2021 has been having constant right-sided chest pain radiating to her right shoulder blade and epigastric area.  She has tried her regular gabapentin but this has not improved this chest pain.  It is worse with palpation and rest.  She has never had pain like this before.  She reports associated nausea but denies any shortness of breath, diaphoresis, vomiting, diarrhea, fever, sick contacts with similar symptoms.  No leg swelling that she is aware of.  Denies any history of DVT or PE, recent immobilization.  She had cardiac work-up done in January 2022 due to her history of lupus with normal echo and CT coronary study.  When she woke up this morning the pain persisted so she decided to come to the ER.  HPI     Past Medical History:  Diagnosis Date   Acute GI bleeding 10/02/2018   Anemia    Whole life   Anxiety 02/19/2019   ARF (acute renal failure) (HCC) 10/02/2018   Back pain    L/S spine with left radiculopathy   Dental decay 12/27/2019   Diverticulosis of colon with hemorrhage 09/24/2018   Elevated blood pressure reading with diagnosis of hypertension 02/19/2019   Exacerbation of systemic lupus (HCC) 12/25/2018   Greater trochanteric bursitis, left 11/09/2020   Hemorrhage of colon following colonoscopy 11/07/2018   Hypertension 1997   Hypocalcemia    Lupus (HCC) 2012   Joint complaints, fatigue, soft tissue swelling, maybe kidney involvement.     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   Vertigo     Patient Active Problem List   Diagnosis Date Noted   Preoperative cardiovascular examination 11/26/2020   Greater trochanteric bursitis, left 11/09/2020   Meralgia paresthetica of left side 08/21/2020   Chest pain of uncertain etiology 08/21/2020   Chest pain 06/30/2020   Snoring 06/04/2020   Moderate episode of recurrent major depressive disorder (HCC) 02/15/2020   PTSD (post-traumatic stress disorder) 02/12/2020   GAD (generalized anxiety disorder) 02/12/2020   MDD (major depressive disorder) 02/12/2020   Dental decay 12/27/2019   Thyromegaly 12/27/2019   Hyperlipidemia 12/27/2019   Easy bruising 12/27/2019   Anemia    Back pain    Trauma and stressor-related disorder 11/20/2019   Seasonal allergies 05/08/2019   Breast pain, left 02/19/2019   Anxiety 02/19/2019   Insomnia 02/19/2019   Elevated blood pressure reading with diagnosis of hypertension 02/19/2019   Exacerbation of systemic lupus (HCC) 12/25/2018   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   Hemorrhage of colon following colonoscopy 11/07/2018   Acute blood loss anemia    Acute GI bleeding 10/02/2018   ARF (acute renal failure) (HCC) 10/02/2018   Lower GI bleed 09/24/2018   Essential hypertension 09/24/2018   Syncope 09/24/2018   Systemic lupus erythematosus (HCC) 09/24/2018   Hypokalemia 09/24/2018  Diverticulosis of colon with hemorrhage 09/24/2018   Hypertension 1997    Past Surgical History:  Procedure Laterality Date   BREAST REDUCTION SURGERY  2007   CHOLECYSTECTOMY  1997   COLONOSCOPY WITH PROPOFOL N/A 09/27/2018   Procedure: COLONOSCOPY WITH PROPOFOL;  Surgeon: Graylin ShiverGanem, Salem F, MD;  Location: [facility];  Service: Endoscopy;  Laterality: N/A;   FLEXIBLE SIGMOIDOSCOPY N/A 10/03/2018    Procedure: FLEXIBLE SIGMOIDOSCOPY;  Surgeon: Kathi DerBrahmbhatt, Parag, MD;  Location: [facility];  Service: Gastroenterology;  Laterality: N/A;   REDUCTION MAMMAPLASTY Bilateral 2007   TOE SURGERY Right    Bunionectomy   TUBAL LIGATION  1992   VAGINAL HYSTERECTOMY  1999     OB History     Gravida  4   Para      Term      Preterm      AB      Living  3      SAB      IAB      Ectopic      Multiple      Live Births              Family History  Problem Relation Age of Onset   Hepatitis C Father    Kidney disease Father        Not sure if due to SLE or if he has SLE   Lung disease Father        has had 1/2 lung removed for unknown reason   Thyroid disease Father        Sounds more like hyperthyroidism--can't gain weight.   Rectal cancer Other    Hypertension Other    Diabetes Other    Breast cancer Other    Colon cancer Other    Prostate cancer Other    Stroke Other    CAD Other    Coronary artery disease Other    Lupus Sister    Asthma Son    Kidney disease Paternal Aunt        Likely due to SLE   Lupus Paternal Aunt    Kidney disease Paternal Grandmother        kidney disease--not clear if had SLE   Breast cancer Maternal Aunt    Breast cancer Maternal Aunt     Social History   Tobacco Use   Smoking status: Never   Smokeless tobacco: Never  Vaping Use   Vaping Use: Never used  Substance Use Topics   Alcohol use: Not Currently    Comment: Wine occasionally   Drug use: Never    Home Medications Prior to Admission medications   Medication Sig Start Date End Date Taking? Authorizing Provider  amLODipine (NORVASC) 10 MG tablet Take 1 tablet (10 mg total) by mouth daily. 11/24/20  Yes Julieanne MansonMulberry, Elizabeth, MD  atorvastatin (LIPITOR) 20 MG tablet 1 tab by mouth with evening meal daily 07/26/20  Yes Julieanne MansonMulberry, Elizabeth, MD  busPIRone (BUSPAR) 15 MG tablet Take 1 tablet (15 mg total) by mouth 3 (three) times daily. 02/23/21  Yes Toy CookeyParsons, Brittney E,  NP  dicyclomine (BENTYL) 20 MG tablet TAKE 1 TABLET BY MOUTH EVERY 6 HOURS AS NEEDED FOR BLOATING 03/23/21  Yes Julieanne MansonMulberry, Elizabeth, MD  famotidine (PEPCID) 20 MG tablet Take 1 tablet (20 mg total) by mouth 2 (two) times daily. 04/08/21  Yes Marlys Stegmaier, PA-C  gabapentin (NEURONTIN) 300 MG capsule Take 1 capsule (300 mg total) by mouth 3 (three) times daily. 02/23/21  Yes Toy CookeyParsons, Brittney  E, NP  isosorbide mononitrate (IMDUR) 30 MG 24 hr tablet Take 1 tablet (30 mg total) by mouth daily. 08/12/20 04/08/21 Yes Julieanne Manson, MD  losartan (COZAAR) 100 MG tablet Take 1 tablet (100 mg total) by mouth daily. 11/24/20  Yes Julieanne Manson, MD  metoprolol tartrate (LOPRESSOR) 50 MG tablet 1 1/2 tabs by mouth twice daily 04/14/20  Yes Julieanne Manson, MD  potassium chloride SA (KLOR-CON M20) 20 MEQ tablet Take 2 tablets (40 mEq total) by mouth daily. 09/01/20  Yes Chandrasekhar, Mahesh A, MD  QUEtiapine (SEROQUEL) 100 MG tablet Take 1.5 tablets (150 mg total) by mouth at bedtime. 02/23/21  Yes Toy Cookey E, NP  sertraline (ZOLOFT) 100 MG tablet Take 1.5 tablets (150 mg total) by mouth daily. 02/23/21  Yes Toy Cookey E, NP  aspirin EC 81 MG tablet Take 1 tablet (81 mg total) by mouth daily. Swallow whole. 04/14/20   Julieanne Manson, MD  chlorthalidone (HYGROTON) 25 MG tablet Take 1 tablet (25 mg total) by mouth daily. 08/21/20 11/19/20  Christell Constant, MD  hydroxychloroquine (PLAQUENIL) 200 MG tablet Take 1 tablet (200 mg total) by mouth 2 (two) times daily. 06/30/20   Julieanne Manson, MD  hydrOXYzine (ATARAX/VISTARIL) 10 MG tablet Take 1 tablet (10 mg total) by mouth 3 (three) times daily as needed. 02/23/21   Shanna Cisco, NP  loratadine (CLARITIN) 10 MG tablet Take 1 tablet (10 mg total) by mouth daily. 05/06/19   [name], FNP  meclizine (ANTIVERT) 25 MG tablet Take 1 tablet (25 mg total) by mouth 3 (three) times daily as needed for dizziness. 10/12/18   [name], FNP  nitroGLYCERIN (NITROSTAT) 0.4 MG SL tablet Place 1 tablet (0.4 mg total) under the tongue every 5 (five) minutes as needed for chest pain. 04/14/20   Julieanne Manson, MD    Allergies    Prochlorperazine  Review of Systems   Review of Systems  Constitutional:  Negative for appetite change, chills and fever.  HENT:  Negative for ear pain, rhinorrhea, sneezing and sore throat.   Eyes:  Negative for photophobia and visual disturbance.  Respiratory:  Negative for cough, chest tightness, shortness of breath and wheezing.   Cardiovascular:  Positive for chest pain. Negative for palpitations.  Gastrointestinal:  Positive for nausea. Negative for abdominal pain, blood in stool, constipation, diarrhea and vomiting.  Genitourinary:  Negative for dysuria, hematuria and urgency.  Musculoskeletal:  Negative for myalgias.  Skin:  Negative for rash.  Neurological:  Negative for dizziness, weakness and light-headedness.   Physical Exam Updated Vital Signs BP (!) 175/107   Pulse 75   Temp 97.9 F (36.6 C) (Oral)   Resp 19   Ht 6' (1.829 m)   Wt 99.7 kg   SpO2 98%   BMI 29.81 kg/m   Physical Exam Vitals and nursing note reviewed.  Constitutional:      General: She is not in acute distress.    Appearance: She is well-developed.  HENT:     Head: Normocephalic and atraumatic.     Nose: Nose normal.  Eyes:     General: No scleral icterus.       Left eye: No discharge.     Conjunctiva/sclera: Conjunctivae normal.  Cardiovascular:     Rate and Rhythm: Normal rate and regular rhythm.     Heart sounds: Normal heart sounds. No murmur heard.   No friction rub. No gallop.  Pulmonary:     Effort: Pulmonary effort is normal.  No respiratory distress.     Breath sounds: Normal breath sounds.  Chest:       Comments: Tenderness in the indicated area. Abdominal:     General: Bowel sounds are normal. There is no distension.     Palpations: Abdomen is soft.     Tenderness: There  is no abdominal tenderness. There is no guarding.  Musculoskeletal:        General: Normal range of motion.     Cervical back: Normal range of motion and neck supple.     Right lower leg: No tenderness. No edema.     Left lower leg: No tenderness. No edema.     Comments: 2+ DP pulses noted bilaterally.  No lower extremity edema, erythema or calf tenderness bilaterally.  Skin:    General: Skin is warm and dry.     Findings: No rash.  Neurological:     Mental Status: She is alert.     Motor: No abnormal muscle tone.     Coordination: Coordination normal.    ED Results / Procedures / Treatments   Labs (all labs ordered are listed, but only abnormal results are displayed) Labs Reviewed  COMPREHENSIVE METABOLIC PANEL - Abnormal; Notable for the following components:      Result Value   Potassium 3.1 (*)    Glucose, Bld 110 (*)    Calcium 8.5 (*)    Total Bilirubin 0.2 (*)    All other components within normal limits  LIPASE, BLOOD  CBC WITH DIFFERENTIAL/PLATELET  D-DIMER, QUANTITATIVE  TROPONIN I (HIGH SENSITIVITY)  TROPONIN I (HIGH SENSITIVITY)    EKG EKG Interpretation  Date/Time:  Thursday April 08 2021 15:05:23 EDT Ventricular Rate:  77 PR Interval:  156 QRS Duration: 88 QT Interval:  400 QTC Calculation: 452 R Axis:   76 Text Interpretation: Sinus rhythm with occasional Premature ventricular complexes Anterior infarct , age undetermined Abnormal ECG Similar to Nov 2021 tracing. Confirmed by Alona Bene [id number]) on 04/08/2021 3:16:13 PM  Radiology DG Chest 2 View  Result Date: 04/08/2021 CLINICAL DATA:  Chest pain EXAM: CHEST - 2 VIEW COMPARISON:  Chest radiograph 06/22/2020 FINDINGS: The cardiomediastinal silhouette is stable. Streaky opacities in the right lower lobe likely reflect atelectasis. There is no focal consolidation or pulmonary edema. There is no pleural effusion or pneumothorax. There is no acute osseous abnormality. IMPRESSION: No radiographic evidence  of acute cardiopulmonary process. Electronically Signed   By: Lesia Hausen M.D.   On: 04/08/2021 15:51    Procedures Procedures   Medications Ordered in ED Medications  famotidine (PEPCID) IVPB 20 mg premix (0 mg Intravenous Stopped 04/08/21 1607)  acetaminophen (TYLENOL) tablet 650 mg (650 mg Oral Given 04/08/21 1539)  methocarbamol (ROBAXIN) tablet 500 mg (500 mg Oral Given 04/08/21 1539)  potassium chloride SA (KLOR-CON) CR tablet 40 mEq (40 mEq Oral Given 04/08/21 1624)    ED Course  I have reviewed the triage vital signs and the nursing notes.  Pertinent labs & imaging results that were available during my care of the patient were reviewed by me and considered in my medical decision making (see chart for details).  Clinical Course as of 04/08/21 1914  Thu Apr 08, 2021  1605 WBC: 6.7 [HK]  1609 Potassium(!): 3.1 [HK]  1609 Troponin I (High Sensitivity): 4 [HK]  1658 D-Dimer, Quant: 0.31 [HK]  1911 Troponin I (High Sensitivity): 4 [HK]    Clinical Course User Index [HK] Dietrich Pates, PA-C   MDM Rules/Calculators/A&P  50 year old female with past medical history of lupus, hypertension, hyperlipidemia, anemia presenting to the ED with a chief complaint of chest pain.  Yesterday woke up and has been having constant right-sided chest pain rating to right shoulder blade and epigastric area.  Minimal improvement noted with her regular gabapentin.  Denies any leg swelling, shortness of breath, diaphoresis, vomiting, diarrhea, fever.  She had a normal echo 1 year ago and a CT coronary study, calcium score of 0.  She woke up this morning with persistent pain so she decided to come to the ER.  On exam the right upper chest wall is tender to palpation.  She has no midline C, T or L-spine tenderness.  Abdomen is soft.  Vital signs within normal limits.  EKG here shows sinus rhythm, no changes from prior tracings, no STEMI.  Chest x-ray is unremarkable.  Her echo done last  year shows a possible thoracic aortic dilatation.  However on her CT coronary study this was not evident.  She has equal and intact distal pulses bilaterally of lower extremities.  D-dimer is negative unable to [facility] out.  Troponins are negative x2.  Hypokalemia of 3.1 which was repleted orally.  She will be given oral repletion for home and have her PCP recheck this in 1 week.  CBC is unremarkable.  I suspect that symptoms could be due to GERD or musculoskeletal cause as her symptoms have improved here with Robaxin, Pepcid and Tylenol.  Doubt ACS based on her reassuring work-up recently and today.  Able to rule out PE with negative D-dimer.  No structural cause seen on x-ray.  We will have her follow-up with primary care provider, cardiologist and return for worsening symptoms.   Patient is hemodynamically stable, in NAD, and able to ambulate in the ED. Evaluation does not show pathology that would require ongoing emergent intervention or inpatient treatment. I explained the diagnosis to the patient. Pain has been managed and has no complaints prior to discharge. Patient is comfortable with above plan and is stable for discharge at this time. All questions were answered prior to disposition. Strict return precautions for returning to the ED were discussed. Encouraged follow up with PCP.   An After Visit Summary was printed and given to the patient.   Portions of this note were generated with Scientist, clinical (histocompatibility and immunogenetics). Dictation errors may occur despite best attempts at proofreading.  Final Clinical Impression(s) / ED Diagnoses Final diagnoses:  Chest wall pain  Gastroesophageal reflux disease, unspecified whether esophagitis present    Rx / DC Orders ED Discharge Orders          Ordered    famotidine (PEPCID) 20 MG tablet  2 times daily        04/08/21 1914             Dietrich Pates, PA-C 04/08/21 1921    Maia Plan, MD 04/09/21 1225

## 2021-04-08 NOTE — ED Triage Notes (Signed)
Right sided chest pain with radiation into her right shoulder and down her arm since last night. Stabbing pain. EKG at triage.

## 2021-04-08 NOTE — Discharge Instructions (Addendum)
Take medications as needed. Your potassium level was low today. You will need to take the potassium pills and have your potassium level rechecked in 1 week by her primary care provider Follow-up with your primary care provider and cardiologist. Return to the ER if you start to experience worsening pain, worsening shortness of breath, leg swelling.

## 2021-05-11 ENCOUNTER — Other Ambulatory Visit: Payer: Self-pay

## 2021-05-11 ENCOUNTER — Ambulatory Visit (INDEPENDENT_AMBULATORY_CARE_PROVIDER_SITE_OTHER): Payer: 59 | Admitting: Clinical

## 2021-05-11 DIAGNOSIS — F331 Major depressive disorder, recurrent, moderate: Secondary | ICD-10-CM | POA: Diagnosis not present
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
## 2021-05-18 ENCOUNTER — Other Ambulatory Visit: Payer: Self-pay

## 2021-05-18 ENCOUNTER — Encounter (HOSPITAL_COMMUNITY): Payer: Self-pay | Admitting: Psychiatry

## 2021-05-18 ENCOUNTER — Telehealth (INDEPENDENT_AMBULATORY_CARE_PROVIDER_SITE_OTHER): Payer: 59 | Admitting: Psychiatry

## 2021-05-18 DIAGNOSIS — F4310 Post-traumatic stress disorder, unspecified: Secondary | ICD-10-CM | POA: Diagnosis not present

## 2021-05-18 DIAGNOSIS — F411 Generalized anxiety disorder: Secondary | ICD-10-CM | POA: Diagnosis not present

## 2021-05-18 DIAGNOSIS — F331 Major depressive disorder, recurrent, moderate: Secondary | ICD-10-CM

## 2021-05-18 MED ORDER — BUSPIRONE HCL 15 MG PO TABS
15.0000 mg | ORAL_TABLET | Freq: Three times a day (TID) | ORAL | 3 refills | Status: DC
Start: 2021-05-18 — End: 2021-08-18

## 2021-05-18 MED ORDER — HYDROXYZINE HCL 10 MG PO TABS: 10.0000 mg | ORAL_TABLET | Freq: Three times a day (TID) | ORAL | 3 refills | Status: DC | PRN

## 2021-05-18 MED ORDER — QUETIAPINE FUMARATE 100 MG PO TABS: 150.0000 mg | ORAL_TABLET | Freq: Every day | ORAL | 3 refills | Status: DC

## 2021-05-18 MED ORDER — SERTRALINE HCL 100 MG PO TABS: 150.0000 mg | ORAL_TABLET | Freq: Every day | ORAL | 3 refills | Status: DC

## 2021-05-18 MED ORDER — GABAPENTIN 300 MG PO CAPS
300.0000 mg | ORAL_CAPSULE | Freq: Three times a day (TID) | ORAL | 3 refills | Status: DC
Start: 2021-05-18 — End: 2021-08-18
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
## 2021-05-27 ENCOUNTER — Encounter: Payer: Self-pay | Admitting: Gastroenterology

## 2021-06-10 ENCOUNTER — Ambulatory Visit: Payer: 59 | Admitting: Gastroenterology

## 2021-07-14 ENCOUNTER — Ambulatory Visit (INDEPENDENT_AMBULATORY_CARE_PROVIDER_SITE_OTHER): Payer: 59 | Admitting: Clinical

## 2021-07-14 DIAGNOSIS — F331 Major depressive disorder, recurrent, moderate: Secondary | ICD-10-CM
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
## 2021-08-03 ENCOUNTER — Ambulatory Visit (HOSPITAL_COMMUNITY): Payer: 59 | Admitting: Clinical

## 2021-08-03 ENCOUNTER — Encounter (HOSPITAL_COMMUNITY): Payer: Self-pay

## 2021-08-18 ENCOUNTER — Telehealth (INDEPENDENT_AMBULATORY_CARE_PROVIDER_SITE_OTHER): Payer: 59 | Admitting: Psychiatry

## 2021-08-18 ENCOUNTER — Encounter (HOSPITAL_COMMUNITY): Payer: Self-pay | Admitting: Psychiatry

## 2021-08-18 DIAGNOSIS — F331 Major depressive disorder, recurrent, moderate: Secondary | ICD-10-CM

## 2021-08-18 DIAGNOSIS — F4310 Post-traumatic stress disorder, unspecified: Secondary | ICD-10-CM

## 2021-08-18 DIAGNOSIS — F411 Generalized anxiety disorder: Secondary | ICD-10-CM

## 2021-08-18 MED ORDER — QUETIAPINE FUMARATE 100 MG PO TABS: 150.0000 mg | ORAL_TABLET | Freq: Every day | ORAL | 3 refills | Status: DC

## 2021-08-18 MED ORDER — BUSPIRONE HCL 15 MG PO TABS: 15.0000 mg | ORAL_TABLET | Freq: Three times a day (TID) | ORAL | 3 refills | Status: DC

## 2021-08-18 MED ORDER — GABAPENTIN 300 MG PO CAPS: 300.0000 mg | ORAL_CAPSULE | Freq: Three times a day (TID) | ORAL | 3 refills | Status: DC

## 2021-08-18 MED ORDER — SERTRALINE HCL 100 MG PO TABS: 150.0000 mg | ORAL_TABLET | Freq: Every day | ORAL | 3 refills | Status: DC

## 2021-08-18 MED ORDER — HYDROXYZINE HCL 10 MG PO TABS: 10.0000 mg | ORAL_TABLET | Freq: Three times a day (TID) | ORAL | 3 refills | Status: DC | PRN

## 2021-08-18 NOTE — Progress Notes (Signed)
[facility] MD/PA/NP OP Progress Note Virtual Visit via Telephone Note  I connected with Crystal Winters on 08/18/21 at  3:30 PM EST by telephone and verified that I am speaking with the correct person using two identifiers.  Location: Patient: home Provider: Clinic   I discussed the limitations, risks, security and privacy concerns of performing an evaluation and management service by telephone and the availability of in person appointments. I also discussed with the patient that there may be a patient responsible charge related to this service. The patient expressed understanding and agreed to proceed.   I provided 30 minutes of non-face-to-face time during this encounter.        08/18/2021 1:46 PM Crystal Winters  MRN:  [id number]  Chief Complaint: "I am doing rally good. The gabapentin helps a lot"  HPI: 51 year old female seen today for follow-up psychiatric evaluation.  She has a psychiatric history of PTSD, anxiety, depression, and insomnia.  She is currently managed on Zoloft 150 mg daily, Seroquel 150 mg nightly, hydroxyzine 10 mg three times daily as needed, gabapentin 300 mg 3 times daily, and BuSpar 15 mg 3 times daily.  She notes that her medications are effective in managing her psychiatric conditions.   Today patient was unable to login virtually so assessment was done over the phone.  During exam she was pleasant, cooperative, and engaged in conversation.  She informed Probation officer has been doing really well.  She notes that she finds gabapentin effective in managing her anxiety and pain.  Patient notes that she is adjusting to leaving her old relationship and reports that each day things get little bit better.  Currently she notes that she is in [place] visiting with her son and grandchildren.  She notes that she is enjoying the peace and finds that she is able to relax more.  Patient notes that her anxiety and depression has improved since her last visit.  Provider conducted a GAD-7 and  patient scored 10, at her last visit she scored a 61.  Provider also conducted a PHQ-9 and patient scored 9, at her last visit she scored a 14.  Patient notes that her sleep has improved reporting that she now sleeps 7 hours instead of 4 hours.  She informed Probation officer that she is on and no meat/carb diet and reports that she has lost 10 pounds.  She endorses having adequate appetite.  Today she denies SI/HI/VAH, mania, or paranoia   No medication changes made today.  Patient agreeable to continue medication as prescribed.  She will follow-up with outpatient counseling for therapy.  No other concerns noted at this time.     Visit Diagnosis:    ICD-10-CM   1. GAD (generalized anxiety disorder)  F41.1 busPIRone (BUSPAR) 15 MG tablet    gabapentin (NEURONTIN) 300 MG capsule    hydrOXYzine (ATARAX) 10 MG tablet    sertraline (ZOLOFT) 100 MG tablet    2. Moderate episode of recurrent major depressive disorder (HCC)  F33.1 busPIRone (BUSPAR) 15 MG tablet    QUEtiapine (SEROQUEL) 100 MG tablet    sertraline (ZOLOFT) 100 MG tablet    3. PTSD (post-traumatic stress disorder)  F43.10 QUEtiapine (SEROQUEL) 100 MG tablet       Past Psychiatric History: PTSD, GAD, Depression, insomnia  Past Medical History:  Past Medical History:  Diagnosis Date   Acute GI bleeding 10/02/2018   Anemia    Whole life   Anxiety 02/19/2019   ARF (acute renal failure) ([place]) 10/02/2018   Back pain  L/S spine with left radiculopathy   Dental decay 12/27/2019   Diverticulosis of colon with hemorrhage 09/24/2018   Elevated blood pressure reading with diagnosis of hypertension 02/19/2019   Exacerbation of systemic lupus ([place]) 12/25/2018   Greater trochanteric bursitis, left 11/09/2020   Hemorrhage of colon following colonoscopy 11/07/2018   Hypertension 1997   Hypocalcemia    Lupus ([place]) 2012   Joint complaints, fatigue, soft tissue swelling, maybe kidney involvement.     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   Vertigo     Past Surgical History:  Procedure Laterality Date   BREAST REDUCTION SURGERY  2007   CHOLECYSTECTOMY  1997   COLONOSCOPY WITH PROPOFOL N/A 09/27/2018   Procedure: COLONOSCOPY WITH PROPOFOL;  Surgeon: Wonda Horner, MD;  Location: [facility];  Service: Endoscopy;  Laterality: N/A;   FLEXIBLE SIGMOIDOSCOPY N/A 10/03/2018   Procedure: FLEXIBLE SIGMOIDOSCOPY;  Surgeon: Otis Brace, MD;  Location: [facility];  Service: Gastroenterology;  Laterality: N/A;   REDUCTION MAMMAPLASTY Bilateral 2007   TOE SURGERY Right    Bunionectomy   TUBAL LIGATION  1992   VAGINAL HYSTERECTOMY  1999    Family Psychiatric History: Mother alcohol use   Family History:  Family History  Problem Relation Age of Onset   Hepatitis C Father    Kidney disease Father        Not sure if due to SLE or if he has SLE   Lung disease Father        has had 1/2 lung removed for unknown reason   Thyroid disease Father        Sounds more like hyperthyroidism--can't gain weight.   Rectal cancer Other    Hypertension Other    Diabetes Other    Breast cancer Other    Colon cancer Other    Prostate cancer Other    Stroke Other    CAD Other    Coronary artery disease Other    Lupus Sister    Asthma Son    Kidney disease Paternal Aunt        Likely due to SLE   Lupus Paternal Aunt    Kidney disease Paternal Grandmother        kidney disease--not clear if had SLE   Breast cancer Maternal Aunt    Breast cancer Maternal Aunt     Social History:  Social History   Socioeconomic History   Marital status: Soil scientist    Spouse name: Divorced   Number of children: 3   Years of education: Not on file    Highest education level: Bachelor's degree (e.g., BA, AB, BS)  Occupational History   Occupation: Mental Health Professional  Tobacco Use   Smoking status: Never   Smokeless tobacco: Never  Vaping Use   Vaping Use: Never used  Substance and Sexual Activity   Alcohol use: Not Currently    Comment: Wine occasionally   Drug use: Never   Sexual activity: Yes    Birth control/protection: Surgical  Other Topics Concern   Not on file  Social History Narrative   Working on [employer] in [place] at home with fiance.   2 dogs, Alfonse Spruce and Duke   Social Determinants of  Health   Financial Resource Strain: Not on file  Food Insecurity: Not on file  Transportation Winters: Not on file  Physical Activity: Not on file  Stress: Not on file  Social Connections: Not on file    Allergies:  Allergies  Allergen Reactions   Prochlorperazine Other (See Comments), Swelling and Palpitations    Muscle stiffness Muscle stiffness    Metabolic Disorder Labs: Lab Results  Component Value Date   HGBA1C 4.5 (L) 05/01/2020   No results found for: PROLACTIN Lab Results  Component Value Date   CHOL 205 (H) 06/30/2020   TRIG 83 06/30/2020   HDL 64 06/30/2020   CHOLHDL 3.1 10/12/2018   LDLCALC 126 (H) 06/30/2020   LDLCALC 140 (H) 12/27/2019   Lab Results  Component Value Date   TSH 1.360 12/27/2019   TSH 1.190 10/12/2018    Therapeutic Level Labs: No results found for: LITHIUM No results found for: VALPROATE No components found for:  CBMZ  Current Medications: Current Outpatient Medications  Medication Sig Dispense Refill   amLODipine (NORVASC) 10 MG tablet Take 1 tablet (10 mg total) by mouth daily. 30 tablet 11   aspirin EC 81 MG tablet Take 1 tablet (81 mg total) by mouth daily. Swallow whole. 30 tablet 11   atorvastatin (LIPITOR) 20 MG tablet 1 tab by mouth with evening meal daily 30 tablet 11   busPIRone (BUSPAR) 15 MG tablet Take 1 tablet (15 mg total) by mouth 3  (three) times daily. 90 tablet 3   chlorthalidone (HYGROTON) 25 MG tablet Take 1 tablet (25 mg total) by mouth daily. 90 tablet 3   dicyclomine (BENTYL) 20 MG tablet TAKE 1 TABLET BY MOUTH EVERY 6 HOURS AS NEEDED FOR BLOATING 30 tablet 8   famotidine (PEPCID) 20 MG tablet Take 1 tablet (20 mg total) by mouth 2 (two) times daily. 30 tablet 0   gabapentin (NEURONTIN) 300 MG capsule Take 1 capsule (300 mg total) by mouth 3 (three) times daily. 90 capsule 3   hydroxychloroquine (PLAQUENIL) 200 MG tablet Take 1 tablet (200 mg total) by mouth 2 (two) times daily. 60 tablet 11   hydrOXYzine (ATARAX) 10 MG tablet Take 1 tablet (10 mg total) by mouth 3 (three) times daily as needed. 90 tablet 3   isosorbide mononitrate (IMDUR) 30 MG 24 hr tablet Take 1 tablet (30 mg total) by mouth daily. 90 tablet 3   loratadine (CLARITIN) 10 MG tablet Take 1 tablet (10 mg total) by mouth daily. 30 tablet 11   losartan (COZAAR) 100 MG tablet Take 1 tablet (100 mg total) by mouth daily. 30 tablet 11   meclizine (ANTIVERT) 25 MG tablet Take 1 tablet (25 mg total) by mouth 3 (three) times daily as needed for dizziness. 30 tablet 3   metoprolol tartrate (LOPRESSOR) 50 MG tablet 1 1/2 tabs by mouth twice daily 90 tablet 11   nitroGLYCERIN (NITROSTAT) 0.4 MG SL tablet Place 1 tablet (0.4 mg total) under the tongue every 5 (five) minutes as needed for chest pain. 25 tablet 1   potassium chloride (KLOR-CON) 10 MEQ tablet Take 4 tablets (40 mEq total) by mouth daily for 4 days. 16 tablet 0   potassium chloride SA (KLOR-CON M20) 20 MEQ tablet Take 2 tablets (40 mEq total) by mouth daily. 90 tablet 3   QUEtiapine (SEROQUEL) 100 MG tablet Take 1.5 tablets (150 mg total) by mouth at bedtime. 45 tablet 3   sertraline (ZOLOFT) 100 MG tablet Take 1.5 tablets (150 mg  total) by mouth daily. 45 tablet 3   No current facility-administered medications for this visit.     Musculoskeletal: Strength & Muscle Tone:  Unable to assess due to  telephone visit Gait & Station:  Unable to assess due to telephone visit Patient leans: N/A  Psychiatric Specialty Exam: Review of Systems  There were no vitals taken for this visit.There is no height or weight on file to calculate BMI.  General Appearance:  Unable to assess due to telephone visit  Eye Contact:   Unable to assess due to telephone visit  Speech:  Clear and Coherent and Normal Rate  Volume:  Normal  Mood:  Euthymic  Affect:  Appropriate and Congruent  Thought Process:  Coherent, Goal Directed and Linear  Orientation:  Full (Time, Place, and Person)  Thought Content: WDL and Logical   Suicidal Thoughts:  No  Homicidal Thoughts:  No  Memory:  Immediate;   Good Recent;   Good Remote;   Good  Judgement:  Good  Insight:  Good  Psychomotor Activity:   Unable to assess due to telephone visit  Concentration:  Concentration: Good and Attention Span: Good  Recall:  Good  Fund of Knowledge: Good  Language: Good  Akathisia:   Unable to assess due to telephone visit  Handed:  Right  AIMS (if indicated):Not done  Assets:  Communication Skills Desire for Improvement Financial Resources/Insurance Housing Intimacy Social Support  ADL's:  Intact  Cognition: WNL  Sleep:  Good   Screenings: GAD-7    Flowsheet Row Video Visit from 08/18/2021 in [facility] Video Visit from 05/18/2021 in [facility] Video Visit from 02/23/2021 in [facility] Video Visit from 11/24/2020 in [facility] Video Visit from 08/26/2020 in [facility]  Total GAD-7 Score 10 15 18 17 18       PHQ2-9    Flowsheet Row Video Visit from 08/18/2021 in [facility] Video Visit from 05/18/2021 in [facility] Video Visit from 02/23/2021 in [facility] Video Visit from 11/24/2020 in  [facility] Video Visit from 08/26/2020 in [facility]  PHQ-2 Total Score 4 3 4 2 4   PHQ-9 Total Score 9 14 17 12 15       Flowsheet Row Video Visit from 05/18/2021 in [facility] ED from 04/08/2021 in [facility] Video Visit from 11/24/2020 in [facility] Error: Q7 should not be populated when Q6 is No No Risk Error: Question 1 not populated        Assessment and Plan: Patient notes that her sleep, pain, anxiety, and depression has improved since her last visit. No medication changes made today.  Patient agreeable to continue medication as prescribed.  1. GAD (generalized anxiety disorder)  Continue- busPIRone (BUSPAR) 15 MG tablet; Take 1 tablet (15 mg total) by mouth 3 (three) times daily.  Dispense: 90 tablet; Refill: 3 Restart- gabapentin (NEURONTIN) 300 MG capsule; Take 1 capsule (300 mg total) by mouth 3 (three) times daily.  Dispense: 90 capsule; Refill: 3 Contiune- hydrOXYzine (ATARAX/VISTARIL) 10 MG tablet; Take 1 tablet (10 mg total) by mouth 3 (three) times daily as needed.  Dispense: 90 tablet; Refill: 3 Contiune- sertraline (ZOLOFT) 100 MG tablet; Take 1.5 tablets (150 mg total) by mouth daily.  Dispense: 45 tablet; Refill: 3  2. Moderate episode of recurrent major depressive disorder (HCC)  Contiune- busPIRone (BUSPAR) 15 MG tablet; Take 1 tablet (15 mg total) by mouth 3 (three) times daily.  Dispense: 90 tablet; Refill: 3 Contiune- sertraline (ZOLOFT) 100 MG tablet; Take 1.5 tablets (150 mg total) by mouth daily.  Dispense: 45 tablet; Refill: 3 - QUEtiapine (SEROQUEL) 100 MG tablet; Take 1.5 tablets (150 mg total) by mouth at bedtime.  Dispense: 45 tablet; Refill: 3  3. PTSD (post-traumatic stress disorder)  Contiune- QUEtiapine (SEROQUEL) 100 MG tablet; Take 1.5 tablets (150 mg total) by mouth at bedtime.   Dispense: 45 tablet; Refill: 3   Follow up in 3 months Follow up with therapy   Salley Slaughter, NP 08/18/2021, 1:46 PM

## 2021-10-03 ENCOUNTER — Other Ambulatory Visit (HOSPITAL_COMMUNITY): Payer: Self-pay | Admitting: Psychiatry

## 2021-10-03 DIAGNOSIS — F4310 Post-traumatic stress disorder, unspecified: Secondary | ICD-10-CM

## 2021-10-03 DIAGNOSIS — F331 Major depressive disorder, recurrent, moderate: Secondary | ICD-10-CM

## 2021-11-02 ENCOUNTER — Telehealth (HOSPITAL_COMMUNITY): Payer: 59 | Admitting: Psychiatry

## 2021-11-02 ENCOUNTER — Telehealth (INDEPENDENT_AMBULATORY_CARE_PROVIDER_SITE_OTHER): Payer: Self-pay | Admitting: Psychiatry

## 2021-11-02 DIAGNOSIS — F411 Generalized anxiety disorder: Secondary | ICD-10-CM

## 2021-11-02 DIAGNOSIS — F331 Major depressive disorder, recurrent, moderate: Secondary | ICD-10-CM

## 2021-11-02 DIAGNOSIS — F4310 Post-traumatic stress disorder, unspecified: Secondary | ICD-10-CM

## 2021-11-02 MED ORDER — BUSPIRONE HCL 15 MG PO TABS: 15.0000 mg | ORAL_TABLET | Freq: Three times a day (TID) | ORAL | 2 refills | Status: DC

## 2021-11-02 MED ORDER — SERTRALINE HCL 100 MG PO TABS: 150.0000 mg | ORAL_TABLET | Freq: Every day | ORAL | 2 refills | Status: DC

## 2021-11-02 MED ORDER — HYDROXYZINE HCL 10 MG PO TABS: 10.0000 mg | ORAL_TABLET | Freq: Three times a day (TID) | ORAL | 2 refills | Status: DC | PRN

## 2021-11-02 MED ORDER — GABAPENTIN 300 MG PO CAPS: 300.0000 mg | ORAL_CAPSULE | Freq: Three times a day (TID) | ORAL | 2 refills | Status: DC

## 2021-11-02 MED ORDER — QUETIAPINE FUMARATE 150 MG PO TABS: 150.0000 mg | ORAL_TABLET | Freq: Every day | ORAL | 2 refills | Status: DC

## 2021-11-02 NOTE — Progress Notes (Signed)
[facility] MD/PA/NP OP Progress Note ? ?11/02/2021 5:31 PM ?Crystal Winters  ?MRN:  [id number] ? ?Virtual Visit via Video Note ? ?I connected with Crystal Winters on 11/02/21 at  5:00 PM EDT by a video enabled telemedicine application and verified that I am speaking with the correct person using two identifiers. ? ?Location: ?Patient: home ?Provider: offsite ?  ?I discussed the limitations of evaluation and management by telemedicine and the availability of in person appointments. The patient expressed understanding and agreed to proceed. ? ?  ?I discussed the assessment and treatment plan with the patient. The patient was provided an opportunity to ask questions and all were answered. The patient agreed with the plan and demonstrated an understanding of the instructions. ?  ?The patient was advised to call back or seek an in-person evaluation if the symptoms worsen or if the condition fails to improve as anticipated. ? ?I provided 5 minutes of non-face-to-face time during this encounter. ? ? ?Mcneil Sober, NP  ? ?Chief Complaint: Medication management ? ?HPI: Crystal Winters is a 51 year old female presenting to [facility] behavioral health outpatient for follow-up psychiatric evaluation.  She has a psychiatric history of major depressive disorder, generalized anxiety disorder, PTSD and insomnia.  Patient symptoms are managed with sertraline 150 mg daily, quetiapine 150 mg at bedtime, hydroxyzine 10 mg 3 times daily as needed for anxiety, gabapentin 300 mg 3 times daily, BuSpar 15 mg 3 times daily.  Patient reports that her medications manage her symptoms well and that she is medication compliant.  She denies adverse effects or the need for dosage adjustment today.  No medication changes today. ? ?Patient is alert and oriented x4, calm, pleasant and willing to engage.  She reports a good mood, appetite and sleep.  She appears well-groomed and dressed appropriately for the weather.  Patient denies suicidal homicidal  ideations, auditory or visual hallucinations, paranoia or delusional thought. ? ? ?Visit Diagnosis:  ?  ICD-10-CM   ?1. Moderate episode of recurrent major depressive disorder (HCC)  F33.1   ?  ?2. GAD (generalized anxiety disorder)  F41.1   ?  ?3. PTSD (post-traumatic stress disorder)  F43.10   ?  ?4. Insomnia due to medical condition  G47.01   ?  ? ? ?Past Psychiatric History: Major depressive disorder, generalized anxiety disorder, PTSD, insomnia ? ?Past Medical History:  ?Past Medical History:  ?Diagnosis Date  ? Acute GI bleeding 10/02/2018  ? Anemia   ? Whole life  ? Anxiety 02/19/2019  ? ARF (acute renal failure) ([facility]) 10/02/2018  ? Back pain   ? L/S spine with left radiculopathy  ? Dental decay 12/27/2019  ? Diverticulosis of colon with hemorrhage 09/24/2018  ? Elevated blood pressure reading with diagnosis of hypertension 02/19/2019  ? Exacerbation of systemic lupus ([facility]) 12/25/2018  ? Greater trochanteric bursitis, left 11/09/2020  ? Hemorrhage of colon following colonoscopy 11/07/2018  ? Hypertension 1997  ? Hypocalcemia   ? Lupus ([facility]) 2012  ? Joint complaints, fatigue, soft tissue swelling, maybe kidney involvement.    ? MDD (major depressive disorder) 02/12/2020  ? Meralgia paresthetica of left side 08/21/2020  ? Mixed hyperlipidemia 12/27/2019  ? Proteinuria 05/2019  ? PTSD (post-traumatic stress disorder) 02/12/2020  ? Snoring 06/04/2020  ? Systemic lupus erythematosus (HCC) 09/24/2018  ? Thyromegaly 12/27/2019  ? 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 Vertigo   ?  ?Past Surgical History:  ?Procedure Laterality Date  ? BREAST REDUCTION SURGERY  2007  ? CHOLECYSTECTOMY  1997  ?  COLONOSCOPY WITH PROPOFOL N/A 09/27/2018  ? Procedure: COLONOSCOPY WITH PROPOFOL;  Surgeon: Graylin Shiver, MD;  Location: [facility];  Service: Endoscopy;  Laterality: N/A;  ? FLEXIBLE SIGMOIDOSCOPY N/A 10/03/2018  ? Procedure: FLEXIBLE SIGMOIDOSCOPY;  Surgeon: Kathi Der, MD;  Location: [facility];  Service:  Gastroenterology;  Laterality: N/A;  ? REDUCTION MAMMAPLASTY Bilateral 2007  ? TOE SURGERY Right   ? Bunionectomy  ? TUBAL LIGATION  1992  ? VAGINAL HYSTERECTOMY  1999  ? ? ?Family Psychiatric History: N/A ? ?Family History:  ?Family History  ?Problem Relation Age of Onset  ? Hepatitis C Father   ? Kidney disease Father   ?     Not sure if due to SLE or if he has SLE  ? Lung disease Father   ?     has had 1/2 lung removed for unknown reason  ? Thyroid disease Father   ?     Sounds more like hyperthyroidism--can't gain weight.  ? Rectal cancer Other   ? Hypertension Other   ? Diabetes Other   ? Breast cancer Other   ? Colon cancer Other   ? Prostate cancer Other   ? Stroke Other   ? CAD Other   ? Coronary artery disease Other   ? Lupus Sister   ? Asthma Son   ? Kidney disease Paternal Aunt   ?     Likely due to SLE  ? Lupus Paternal Aunt   ? Kidney disease Paternal Grandmother   ?     kidney disease--not clear if had SLE  ? Breast cancer Maternal Aunt   ? Breast cancer Maternal Aunt   ? ? ?Social History:  ?Social History  ? ?Socioeconomic History  ? Marital status: Media planner  ?  Spouse name: Divorced  ? Number of children: 3  ? Years of education: Not on file  ? Highest education level: Bachelor's degree (e.g., BA, AB, BS)  ?Occupational History  ? Occupation: Mental Health Professional  ?Tobacco Use  ? Smoking status: Never  ? Smokeless tobacco: Never  ?Vaping Use  ? Vaping Use: Never used  ?Substance and Sexual Activity  ? Alcohol use: Not Currently  ?  Comment: Wine occasionally  ? Drug use: Never  ? Sexual activity: Yes  ?  Birth control/protection: Surgical  ?Other Topics Concern  ? Not on file  ?Social History Narrative  ? Working on [employer] in [employer]  ? Lives at home with fiance.  ? 2 dogs, Criss Alvine and Duke  ? ?Social Determinants of Health  ? ?Financial Resource Strain: Not on file  ?Food Insecurity: Not on file  ?Transportation Needs: Not on file  ?Physical Activity: Not on file  ?Stress:  Not on file  ?Social Connections: Not on file  ? ? ?Allergies:  ?Allergies  ?Allergen Reactions  ? Prochlorperazine Other (See Comments), Swelling and Palpitations  ?  Muscle stiffness ?Muscle stiffness  ? ? ?Metabolic Disorder Labs: ?Lab Results  ?Component Value Date  ? HGBA1C 4.5 (L) 05/01/2020  ? ?No results found for: PROLACTIN ?Lab Results  ?Component Value Date  ? CHOL 205 (H) 06/30/2020  ? TRIG 83 06/30/2020  ? HDL 64 06/30/2020  ? CHOLHDL 3.1 10/12/2018  ? LDLCALC 126 (H) 06/30/2020  ? LDLCALC 140 (H) 12/27/2019  ? ?Lab Results  ?Component Value Date  ? TSH 1.360 12/27/2019  ? TSH 1.190 10/12/2018  ? ? ?Therapeutic Level Labs: ?No results found for: LITHIUM ?No results found for: VALPROATE ?No  components found for:  CBMZ ? ?Current Medications: ?Current Outpatient Medications  ?Medication Sig Dispense Refill  ? amLODipine (NORVASC) 10 MG tablet Take 1 tablet (10 mg total) by mouth daily. 30 tablet 11  ? aspirin EC 81 MG tablet Take 1 tablet (81 mg total) by mouth daily. Swallow whole. 30 tablet 11  ? atorvastatin (LIPITOR) 20 MG tablet 1 tab by mouth with evening meal daily 30 tablet 11  ? busPIRone (BUSPAR) 15 MG tablet Take 1 tablet (15 mg total) by mouth 3 (three) times daily. 90 tablet 3  ? chlorthalidone (HYGROTON) 25 MG tablet Take 1 tablet (25 mg total) by mouth daily. 90 tablet 3  ? dicyclomine (BENTYL) 20 MG tablet TAKE 1 TABLET BY MOUTH EVERY 6 HOURS AS NEEDED FOR BLOATING 30 tablet 8  ? famotidine (PEPCID) 20 MG tablet Take 1 tablet (20 mg total) by mouth 2 (two) times daily. 30 tablet 0  ? gabapentin (NEURONTIN) 300 MG capsule Take 1 capsule (300 mg total) by mouth 3 (three) times daily. 90 capsule 3  ? hydroxychloroquine (PLAQUENIL) 200 MG tablet Take 1 tablet (200 mg total) by mouth 2 (two) times daily. 60 tablet 11  ? hydrOXYzine (ATARAX) 10 MG tablet Take 1 tablet (10 mg total) by mouth 3 (three) times daily as needed. 90 tablet 3  ? isosorbide mononitrate (IMDUR) 30 MG 24 hr tablet Take 1  tablet (30 mg total) by mouth daily. 90 tablet 3  ? loratadine (CLARITIN) 10 MG tablet Take 1 tablet (10 mg total) by mouth daily. 30 tablet 11  ? losartan (COZAAR) 100 MG tablet Take 1 tablet (100 mg total) by mouth daily.

## 2021-11-04 ENCOUNTER — Telehealth (HOSPITAL_COMMUNITY): Payer: Self-pay | Admitting: *Deleted

## 2021-11-04 ENCOUNTER — Other Ambulatory Visit (HOSPITAL_COMMUNITY): Payer: Self-pay | Admitting: Psychiatry

## 2021-11-04 ENCOUNTER — Other Ambulatory Visit (HOSPITAL_COMMUNITY): Payer: Self-pay | Admitting: *Deleted

## 2021-11-04 DIAGNOSIS — F4310 Post-traumatic stress disorder, unspecified: Secondary | ICD-10-CM

## 2021-11-04 DIAGNOSIS — F331 Major depressive disorder, recurrent, moderate: Secondary | ICD-10-CM

## 2021-11-04 MED ORDER — QUETIAPINE FUMARATE 50 MG PO TABS: 150.0000 mg | ORAL_TABLET | Freq: Every day | ORAL | 2 refills | Status: DC

## 2021-11-04 MED ORDER — QUETIAPINE FUMARATE 50 MG PO TABS: 150.0000 mg | ORAL_TABLET | Freq: Two times a day (BID) | ORAL | 2 refills | Status: DC

## 2021-11-04 NOTE — Progress Notes (Signed)
Quetiapine order changed to three 50 mg tabs at bedtime as opposed to a 150 mg tab at bedtime due to insurance coverage issue. ?

## 2021-11-04 NOTE — Telephone Encounter (Signed)
Call from [facility] stating they received two different rxs for patients quetiapine and asking for clarity. Reviewed record and spoke to Crystal Winters who does the patients authorizations which is what is dictating the sig and changed the order and called the pharmacy with the sig as 50 mg po Q HS, rather than what the pharmacy got as a rx today which was 50 mg 3 pills BID.  ?

## 2021-11-04 NOTE — Telephone Encounter (Signed)
Friday HEALTH PLAN WILL ONLY COVER ? QUEtiapine 150 MG TABS ?Take 150 mg by mouth at bedtime.,  ?  ?**SCRIPT NEEDS TO BE WRITTEN  AS QUETIAPINE 50 MG TAKE 3 BY MOUTH @ BED TIME --TO BE COVERED ?

## 2022-01-26 ENCOUNTER — Telehealth (HOSPITAL_COMMUNITY): Payer: 59 | Admitting: Psychiatry

## 2022-02-03 ENCOUNTER — Encounter (HOSPITAL_COMMUNITY): Payer: Self-pay | Admitting: Psychiatry

## 2022-02-03 ENCOUNTER — Telehealth (INDEPENDENT_AMBULATORY_CARE_PROVIDER_SITE_OTHER): Payer: Self-pay | Admitting: Psychiatry

## 2022-02-03 DIAGNOSIS — F331 Major depressive disorder, recurrent, moderate: Secondary | ICD-10-CM

## 2022-02-03 DIAGNOSIS — F411 Generalized anxiety disorder: Secondary | ICD-10-CM

## 2022-02-03 MED ORDER — QUETIAPINE FUMARATE 50 MG PO TABS: 150.0000 mg | ORAL_TABLET | Freq: Every day | ORAL | 3 refills | Status: AC

## 2022-02-03 MED ORDER — HYDROXYZINE HCL 10 MG PO TABS: 10.0000 mg | ORAL_TABLET | Freq: Three times a day (TID) | ORAL | 3 refills | Status: AC | PRN

## 2022-02-03 MED ORDER — GABAPENTIN 300 MG PO CAPS: 300.0000 mg | ORAL_CAPSULE | Freq: Three times a day (TID) | ORAL | 3 refills | Status: AC

## 2022-02-03 MED ORDER — BUSPIRONE HCL 15 MG PO TABS: 15.0000 mg | ORAL_TABLET | Freq: Three times a day (TID) | ORAL | 3 refills | Status: AC

## 2022-02-03 MED ORDER — SERTRALINE HCL 100 MG PO TABS: 150.0000 mg | ORAL_TABLET | Freq: Every day | ORAL | 2 refills | Status: AC

## 2022-02-03 NOTE — Progress Notes (Signed)
[facility] MD/PA/NP OP Progress Note Virtual Visit via Telephone Note  I connected with Crystal Winters on 02/03/22 at  4:00 PM EDT by telephone and verified that I am speaking with the correct person using two identifiers.  Location: Patient: home Provider: Clinic   I discussed the limitations, risks, security and privacy concerns of performing an evaluation and management service by telephone and the availability of in person appointments. I also discussed with the patient that there may be a patient responsible charge related to this service. The patient expressed understanding and agreed to proceed.   I provided 30 minutes of non-face-to-face time during this encounter.        02/03/2022 1:20 PM Crystal Winters  MRN:  [id number]  Chief Complaint: "I am visiting my son"  HPI: 51 year old female seen today for follow-up psychiatric evaluation.  She has a psychiatric history of PTSD, anxiety, depression, and insomnia.  She is currently managed on Zoloft 150 mg daily, Seroquel 150 mg nightly, hydroxyzine 10 mg three times daily as needed, gabapentin 300 mg 3 times daily, and BuSpar 15 mg 3 times daily.  She notes that her medications are effective in managing her psychiatric conditions.   Today patient was unable to login virtually so assessment was done over the phone.  During exam she was pleasant, cooperative, and engaged in conversation.  She informed Probation officer has been visiting her son in [place] and notes that her visit is going well.  She informed Probation officer that her mood is stable and notes that she has minimal anxiety and depression.  Provider conducted a GAD-7 and patient scored a 5.  Provider also conducted PHQ-9 and patient scored a 1.  She endorses adequate sleep and appetite.  Today she denies SI/HI/VAH, mania, paranoia.    No medication changes made today.  Patient agreeable to continue medication as prescribed.  She will follow-up with outpatient counseling for therapy.  No other concerns  noted at this time.     Visit Diagnosis:    ICD-10-CM   1. Moderate episode of recurrent major depressive disorder (HCC)  F33.1 busPIRone (BUSPAR) 15 MG tablet    sertraline (ZOLOFT) 100 MG tablet    2. GAD (generalized anxiety disorder)  F41.1 busPIRone (BUSPAR) 15 MG tablet    gabapentin (NEURONTIN) 300 MG capsule    hydrOXYzine (ATARAX) 10 MG tablet    sertraline (ZOLOFT) 100 MG tablet       Past Psychiatric History: PTSD, GAD, Depression, insomnia  Past Medical History:  Past Medical History:  Diagnosis Date   Acute GI bleeding 10/02/2018   Anemia    Whole life   Anxiety 02/19/2019   ARF (acute renal failure) ([place]) 10/02/2018   Back pain    L/S spine with left radiculopathy   Dental decay 12/27/2019   Diverticulosis of colon with hemorrhage 09/24/2018   Elevated blood pressure reading with diagnosis of hypertension 02/19/2019   Exacerbation of systemic lupus ([place]) 12/25/2018   Greater trochanteric bursitis, left 11/09/2020   Hemorrhage of colon following colonoscopy 11/07/2018   Hypertension 1997   Hypocalcemia    Lupus ([place]) 2012   Joint complaints, fatigue, soft tissue swelling, maybe kidney involvement.     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  Vertigo     Past Surgical History:  Procedure Laterality Date   BREAST REDUCTION SURGERY  2007   CHOLECYSTECTOMY  1997   COLONOSCOPY WITH PROPOFOL N/A 09/27/2018   Procedure: COLONOSCOPY WITH PROPOFOL;  Surgeon: Graylin Shiver, MD;  Location: [facility];  Service: Endoscopy;  Laterality: N/A;   FLEXIBLE SIGMOIDOSCOPY N/A 10/03/2018   Procedure: FLEXIBLE SIGMOIDOSCOPY;  Surgeon: Kathi Der, MD;  Location: [facility];   Service: Gastroenterology;  Laterality: N/A;   REDUCTION MAMMAPLASTY Bilateral 2007   TOE SURGERY Right    Bunionectomy   TUBAL LIGATION  1992   VAGINAL HYSTERECTOMY  1999    Family Psychiatric History: Mother alcohol use   Family History:  Family History  Problem Relation Age of Onset   Hepatitis C Father    Kidney disease Father        Not sure if due to SLE or if he has SLE   Lung disease Father        has had 1/2 lung removed for unknown reason   Thyroid disease Father        Sounds more like hyperthyroidism--can't gain weight.   Rectal cancer Other    Hypertension Other    Diabetes Other    Breast cancer Other    Colon cancer Other    Prostate cancer Other    Stroke Other    CAD Other    Coronary artery disease Other    Lupus Sister    Asthma Son    Kidney disease Paternal Aunt        Likely due to SLE   Lupus Paternal Aunt    Kidney disease Paternal Grandmother        kidney disease--not clear if had SLE   Breast cancer Maternal Aunt    Breast cancer Maternal Aunt     Social History:  Social History   Socioeconomic History   Marital status: Media planner    Spouse name: Divorced   Number of children: 3   Years of education: Not on file   Highest education level: Bachelor's degree (e.g., BA, AB, BS)  Occupational History   Occupation: Mental Health Professional  Tobacco Use   Smoking status: Never   Smokeless tobacco: Never  Vaping Use   Vaping Use: Never used  Substance and Sexual Activity   Alcohol use: Not Currently    Comment: Wine occasionally   Drug use: Never   Sexual activity: Yes    Birth control/protection: Surgical  Other Topics Concern   Not on file  Social History Narrative   Working on Masters in [employer]   Lives at home with fiance.   2 dogs, Criss Alvine and Duke   Social Determinants of Health   Financial Resource Strain: Low Risk  (12/27/2019)   Overall Financial Resource Strain (CARDIA)    Difficulty of Paying  Living Expenses: Not hard at all  Food Insecurity: No Food Insecurity (12/27/2019)   Hunger Vital Sign    Worried About Running Out of Food in the Last Year: Never true    Ran Out of Food in the Last Year: Never true  Transportation Winters: No Transportation Winters (12/27/2019)   PRAPARE - Administrator, Civil Service (Medical): No    Lack of Transportation (Non-Medical): No  Physical Activity: Not on file  Stress: Stress Concern Present (09/25/2019)   [employer] of Occupational Health - Occupational Stress Questionnaire    Feeling of Stress : Very much  Social Connections: Not  on file    Allergies:  Allergies  Allergen Reactions   Prochlorperazine Other (See Comments), Swelling and Palpitations    Muscle stiffness Muscle stiffness    Metabolic Disorder Labs: Lab Results  Component Value Date   HGBA1C 4.5 (L) 05/01/2020   No results found for: "PROLACTIN" Lab Results  Component Value Date   CHOL 205 (H) 06/30/2020   TRIG 83 06/30/2020   HDL 64 06/30/2020   CHOLHDL 3.1 10/12/2018   LDLCALC 126 (H) 06/30/2020   LDLCALC 140 (H) 12/27/2019   Lab Results  Component Value Date   TSH 1.360 12/27/2019   TSH 1.190 10/12/2018    Therapeutic Level Labs: No results found for: "LITHIUM" No results found for: "VALPROATE" No results found for: "CBMZ"  Current Medications: Current Outpatient Medications  Medication Sig Dispense Refill   amLODipine (NORVASC) 10 MG tablet Take 1 tablet (10 mg total) by mouth daily. 30 tablet 11   aspirin EC 81 MG tablet Take 1 tablet (81 mg total) by mouth daily. Swallow whole. 30 tablet 11   atorvastatin (LIPITOR) 20 MG tablet 1 tab by mouth with evening meal daily 30 tablet 11   busPIRone (BUSPAR) 15 MG tablet Take 1 tablet (15 mg total) by mouth 3 (three) times daily. 90 tablet 3   chlorthalidone (HYGROTON) 25 MG tablet Take 1 tablet (25 mg total) by mouth daily. 90 tablet 3   dicyclomine (BENTYL) 20 MG tablet TAKE 1 TABLET BY  MOUTH EVERY 6 HOURS AS NEEDED FOR BLOATING 30 tablet 8   famotidine (PEPCID) 20 MG tablet Take 1 tablet (20 mg total) by mouth 2 (two) times daily. 30 tablet 0   gabapentin (NEURONTIN) 300 MG capsule Take 1 capsule (300 mg total) by mouth 3 (three) times daily. 90 capsule 3   hydroxychloroquine (PLAQUENIL) 200 MG tablet Take 1 tablet (200 mg total) by mouth 2 (two) times daily. 60 tablet 11   hydrOXYzine (ATARAX) 10 MG tablet Take 1 tablet (10 mg total) by mouth 3 (three) times daily as needed. 90 tablet 3   isosorbide mononitrate (IMDUR) 30 MG 24 hr tablet Take 1 tablet (30 mg total) by mouth daily. 90 tablet 3   loratadine (CLARITIN) 10 MG tablet Take 1 tablet (10 mg total) by mouth daily. 30 tablet 11   losartan (COZAAR) 100 MG tablet Take 1 tablet (100 mg total) by mouth daily. 30 tablet 11   meclizine (ANTIVERT) 25 MG tablet Take 1 tablet (25 mg total) by mouth 3 (three) times daily as needed for dizziness. 30 tablet 3   metoprolol tartrate (LOPRESSOR) 50 MG tablet 1 1/2 tabs by mouth twice daily 90 tablet 11   nitroGLYCERIN (NITROSTAT) 0.4 MG SL tablet Place 1 tablet (0.4 mg total) under the tongue every 5 (five) minutes as needed for chest pain. 25 tablet 1   potassium chloride (KLOR-CON) 10 MEQ tablet Take 4 tablets (40 mEq total) by mouth daily for 4 days. 16 tablet 0   potassium chloride SA (KLOR-CON M20) 20 MEQ tablet Take 2 tablets (40 mEq total) by mouth daily. 90 tablet 3   QUEtiapine (SEROQUEL) 50 MG tablet Take 3 tablets (150 mg total) by mouth at bedtime. 90 tablet 3   sertraline (ZOLOFT) 100 MG tablet Take 1.5 tablets (150 mg total) by mouth daily. 45 tablet 2   No current facility-administered medications for this visit.     Musculoskeletal: Strength & Muscle Tone:  Unable to assess due to telephone visit Gait & Station:  Unable to assess due to telephone visit Patient leans: N/A  Psychiatric Specialty Exam: Review of Systems  There were no vitals taken for this  visit.There is no height or weight on file to calculate BMI.  General Appearance:  Unable to assess due to telephone visit  Eye Contact:   Unable to assess due to telephone visit  Speech:  Clear and Coherent and Normal Rate  Volume:  Normal  Mood:  Euthymic  Affect:  Appropriate and Congruent  Thought Process:  Coherent, Goal Directed and Linear  Orientation:  Full (Time, Place, and Person)  Thought Content: WDL and Logical   Suicidal Thoughts:  No  Homicidal Thoughts:  No  Memory:  Immediate;   Good Recent;   Good Remote;   Good  Judgement:  Good  Insight:  Good  Psychomotor Activity:   Unable to assess due to telephone visit  Concentration:  Concentration: Good and Attention Span: Good  Recall:  Good  Fund of Knowledge: Good  Language: Good  Akathisia:   Unable to assess due to telephone visit  Handed:  Right  AIMS (if indicated):Not done  Assets:  Communication Skills Desire for Improvement Financial Resources/Insurance Housing Intimacy Social Support  ADL's:  Intact  Cognition: WNL  Sleep:  Good   Screenings: GAD-7    Flowsheet Row Video Visit from 02/03/2022 in [facility] Video Visit from 08/18/2021 in [facility] Video Visit from 05/18/2021 in [facility] Video Visit from 02/23/2021 in [facility] Video Visit from 11[facility]24/2020 in [facility]  Total GAD-7 Score 5 10 15 18 17       PHQ2-9    Flowsheet Row Video Visit from 02/03/2022 in [facility] Video Visit from 08/18/2021 in [facility] Video Visit from 05/18/2021 in [facility] Video Visit from 02/23/2021 in [facility] Video Visit from 11/24/2020 in [facility]  PHQ-2 Total Score 0 4 3 4 2   PHQ-9 Total Score 1 9 14 17 12        Flowsheet Row Video Visit from 05/18/2021 in [facility] ED from 04/08/2021 in [facility] Video Visit from 11/24/2020 in [facility] Error: Q7 should not be populated when Q6 is No No Risk Error: Question 1 not populated        Assessment and Plan: Patient notes that doing well on current medication regimen.  No medication changes made today.  Patient agreeable to continue medication as prescribed.  1. GAD (generalized anxiety disorder)  Continue- busPIRone (BUSPAR) 15 MG tablet; Take 1 tablet (15 mg total) by mouth 3 (three) times daily.  Dispense: 90 tablet; Refill: 3 Restart- gabapentin (NEURONTIN) 300 MG capsule; Take 1 capsule (300 mg total) by mouth 3 (three) times daily.  Dispense: 90 capsule; Refill: 3 Contiune- hydrOXYzine (ATARAX/VISTARIL) 10 MG tablet; Take 1 tablet (10 mg total) by mouth 3 (three) times daily as needed.  Dispense: 90 tablet; Refill: 3 Contiune- sertraline (ZOLOFT) 100 MG tablet; Take 1.5 tablets (150 mg total) by mouth daily.  Dispense: 45 tablet; Refill: 3  2. Moderate episode of recurrent major depressive disorder (HCC)  Contiune- busPIRone (BUSPAR) 15 MG tablet; Take 1 tablet (15 mg total) by mouth 3 (three) times daily.  Dispense: 90 tablet; Refill: 3 Contiune- sertraline (ZOLOFT) 100 MG tablet; Take  1.5 tablets (150 mg total) by mouth daily.  Dispense: 45 tablet; Refill: 3 - QUEtiapine (SEROQUEL) 100 MG tablet; Take 1.5 tablets (150 mg total) by mouth at bedtime.  Dispense: 45 tablet; Refill: 3  3. PTSD (post-traumatic stress disorder)  Contiune- QUEtiapine (SEROQUEL) 100 MG tablet; Take 1.5 tablets (150 mg total) by mouth at bedtime.  Dispense: 45 tablet; Refill: 3   Follow up in 3 months Follow up with therapy   Salley Slaughter, NP 02/03/2022, 1:20 PM

## 2022-05-06 ENCOUNTER — Telehealth (HOSPITAL_COMMUNITY): Payer: Self-pay | Admitting: Psychiatry

## 2022-05-06 ENCOUNTER — Encounter (HOSPITAL_COMMUNITY): Payer: Self-pay

## 2022-10-11 IMAGING — MG MM DIGITAL SCREENING BILAT W/ TOMO AND CAD
8 series · 8 of 24 positions shown · non-contrast
Comparison: Previous exam(s).

ACR Breast Density Category a: The breast tissue is almost entirely
fatty.

CLINICAL DATA: Screening.

EXAM:
DIGITAL SCREENING BILATERAL MAMMOGRAM WITH TOMOSYNTHESIS AND CAD
TECHNIQUE: Bilateral screening digital craniocaudal and mediolateral oblique
mammograms were obtained. Bilateral screening digital breast
tomosynthesis was performed. The images were evaluated with
computer-aided detection.

[L CC synth-2D]
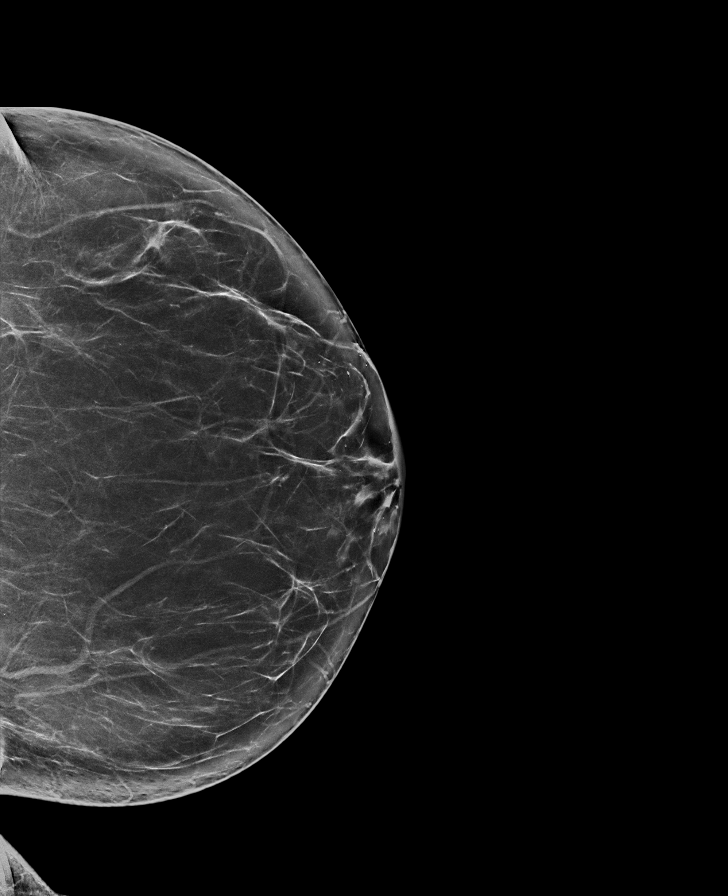

[R CC synth-2D]
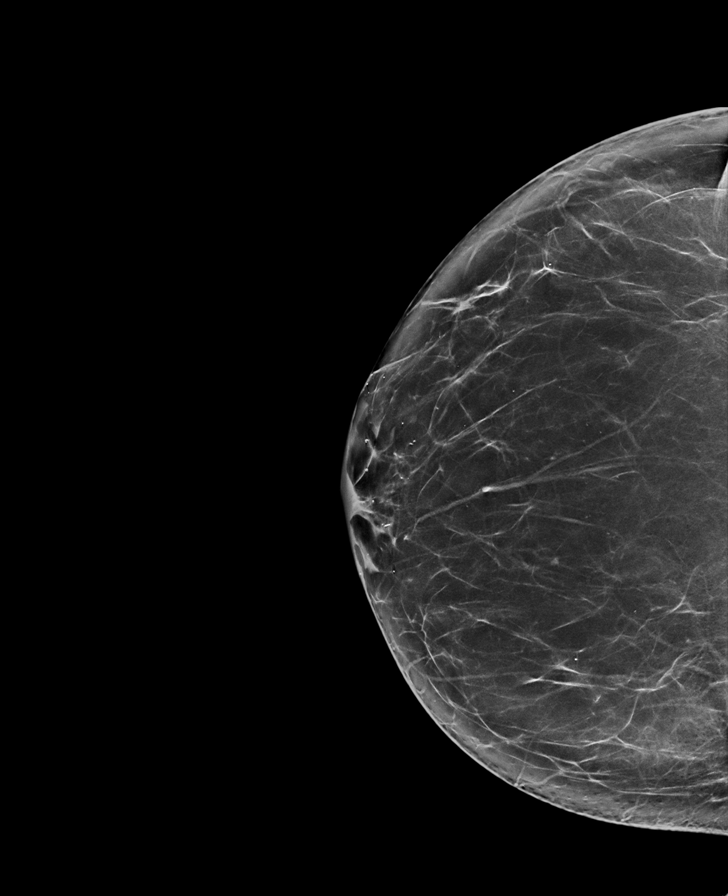

[R MLO synth-2D]
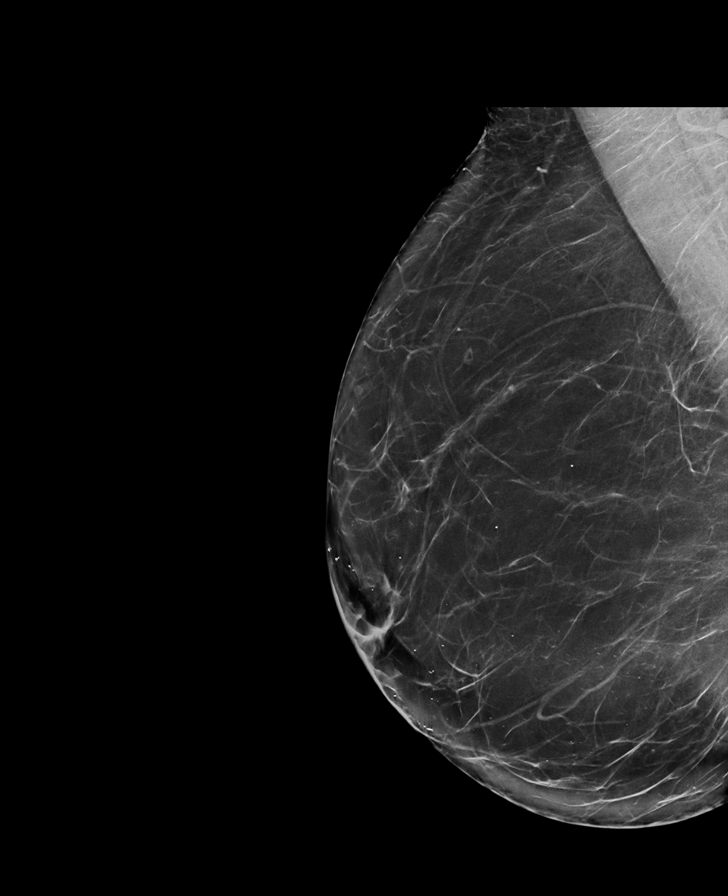

[L MLO synth-2D]
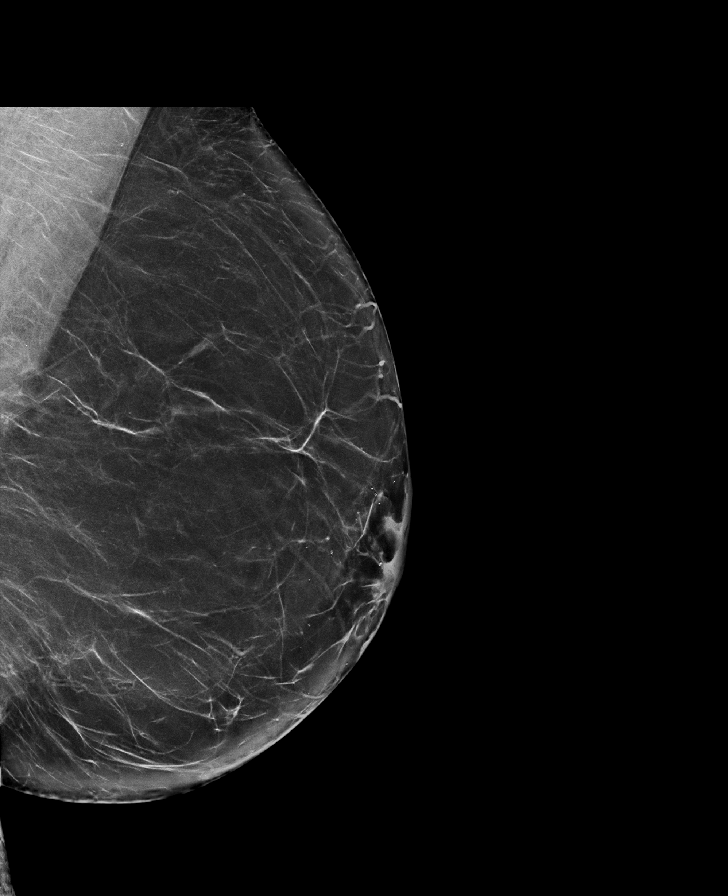

[R CC tomo · tomo slice 39/77.0]
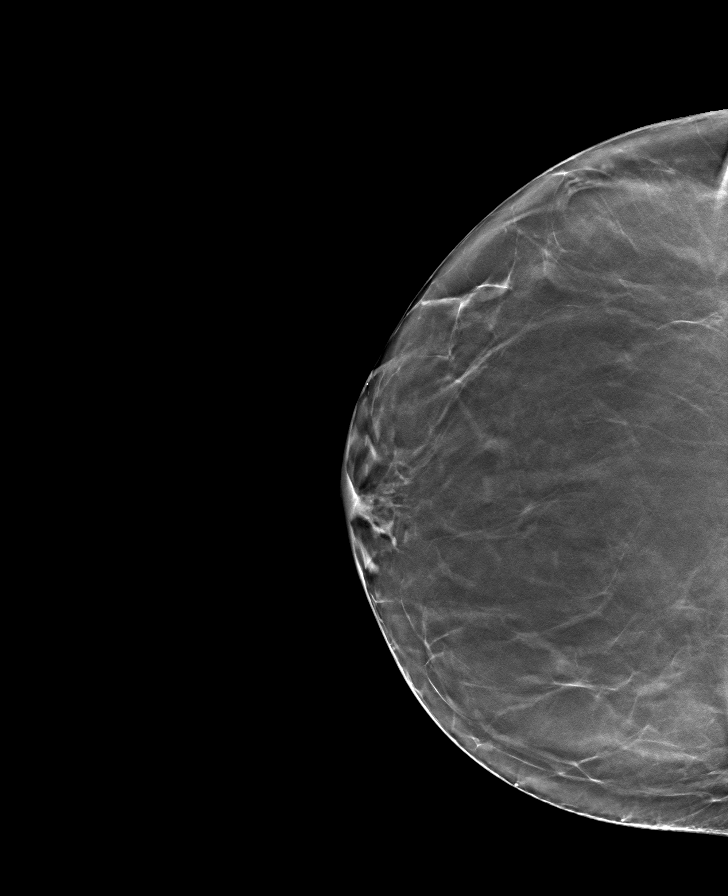

[L CC tomo · tomo slice 39/76.0]
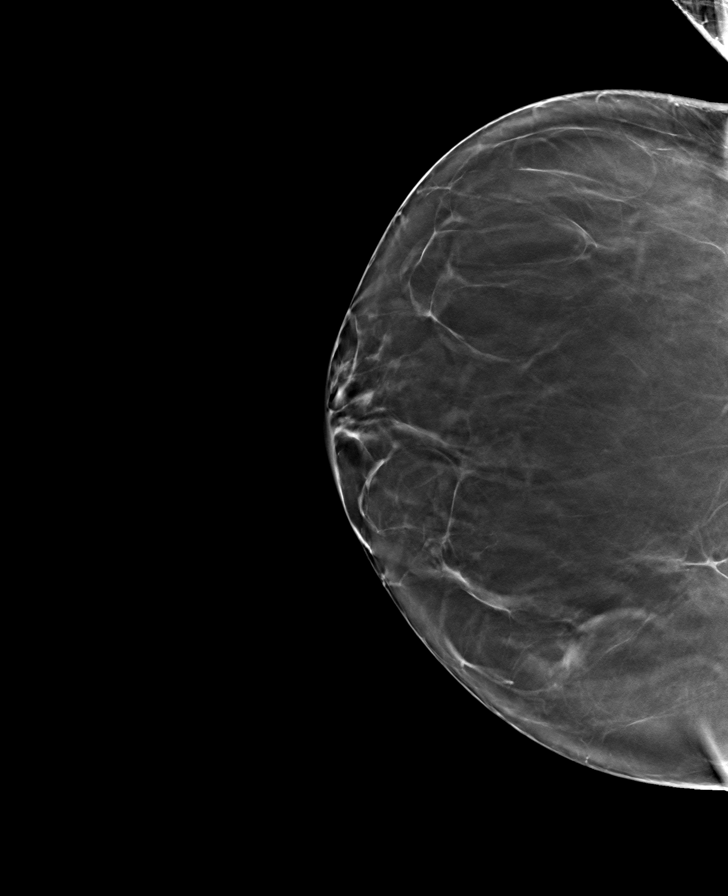

[L MLO tomo · tomo slice 39/78.0]
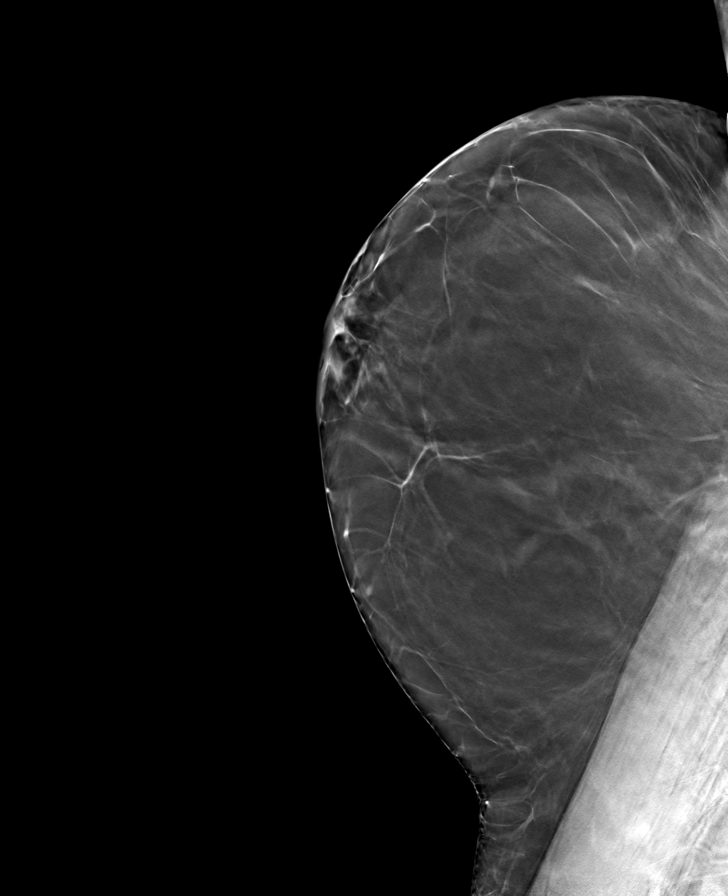

[R MLO tomo · tomo slice 42/83.0]
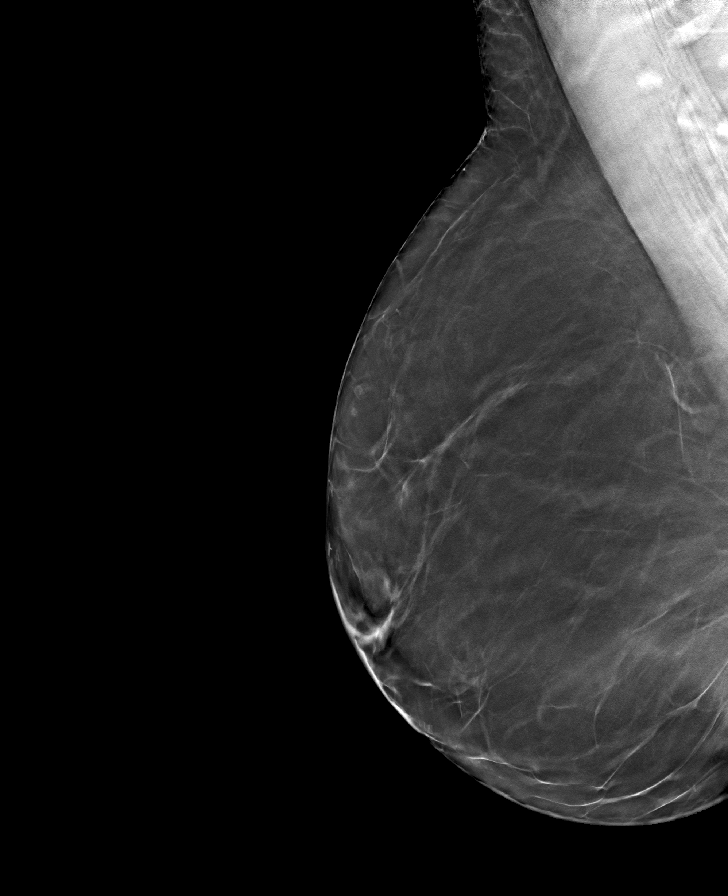

[8 of 24 positions shown; findings below may reference images not displayed]

FINDINGS: There are no findings suspicious for malignancy. The images were
evaluated with computer-aided detection.
IMPRESSION: No mammographic evidence of malignancy. A result letter of this
screening mammogram will be mailed directly to the patient.

RECOMMENDATION:
Screening mammogram in one year. (Code:JP-J-DD5)

BI-RADS CATEGORY  1: Negative.
